# Patient Record
Sex: Female | Born: 1950 | Race: Black or African American | Hispanic: No | Marital: Single | Smoking: Never smoker
Health system: Southern US, Community
[De-identification: ages and names within clinical notes are randomized; demographics above are authoritative.]

## PROBLEM LIST (undated history)

## (undated) DIAGNOSIS — R001 Bradycardia, unspecified: Secondary | ICD-10-CM

## (undated) DIAGNOSIS — M1990 Unspecified osteoarthritis, unspecified site: Secondary | ICD-10-CM

## (undated) DIAGNOSIS — Z973 Presence of spectacles and contact lenses: Secondary | ICD-10-CM

## (undated) DIAGNOSIS — G8929 Other chronic pain: Secondary | ICD-10-CM

## (undated) DIAGNOSIS — R7303 Prediabetes: Secondary | ICD-10-CM

## (undated) DIAGNOSIS — I10 Essential (primary) hypertension: Secondary | ICD-10-CM

## (undated) DIAGNOSIS — M5137 Other intervertebral disc degeneration, lumbosacral region: Secondary | ICD-10-CM

## (undated) DIAGNOSIS — F329 Major depressive disorder, single episode, unspecified: Secondary | ICD-10-CM

## (undated) DIAGNOSIS — E7800 Pure hypercholesterolemia, unspecified: Secondary | ICD-10-CM

## (undated) DIAGNOSIS — N840 Polyp of corpus uteri: Secondary | ICD-10-CM

## (undated) DIAGNOSIS — M51379 Other intervertebral disc degeneration, lumbosacral region without mention of lumbar back pain or lower extremity pain: Secondary | ICD-10-CM

## (undated) DIAGNOSIS — R5382 Chronic fatigue, unspecified: Secondary | ICD-10-CM

## (undated) DIAGNOSIS — N950 Postmenopausal bleeding: Secondary | ICD-10-CM

## (undated) DIAGNOSIS — M549 Dorsalgia, unspecified: Secondary | ICD-10-CM

## (undated) DIAGNOSIS — R011 Cardiac murmur, unspecified: Secondary | ICD-10-CM

## (undated) HISTORY — DX: Essential (primary) hypertension: I10

## (undated) HISTORY — DX: Unspecified osteoarthritis, unspecified site: M19.90

## (undated) HISTORY — DX: Chronic fatigue, unspecified: R53.82

## (undated) HISTORY — DX: Cardiac murmur, unspecified: R01.1

## (undated) HISTORY — DX: Dorsalgia, unspecified: M54.9

## (undated) HISTORY — DX: Prediabetes: R73.03

## (undated) HISTORY — DX: Major depressive disorder, single episode, unspecified: F32.9

## (undated) HISTORY — DX: Bradycardia, unspecified: R00.1

## (undated) HISTORY — DX: Pure hypercholesterolemia, unspecified: E78.00

## (undated) HISTORY — PX: TUBAL LIGATION: SHX77

---

## 1997-09-08 ENCOUNTER — Encounter: Admission: RE | Admit: 1997-09-08 | Discharge: 1997-09-08 | Payer: Self-pay | Admitting: Family Medicine

## 1997-11-16 ENCOUNTER — Encounter: Admission: RE | Admit: 1997-11-16 | Discharge: 1997-11-16 | Payer: Self-pay | Admitting: Family Medicine

## 1997-11-28 ENCOUNTER — Encounter: Admission: RE | Admit: 1997-11-28 | Discharge: 1997-11-28 | Payer: Self-pay | Admitting: Family Medicine

## 1997-12-26 ENCOUNTER — Encounter: Admission: RE | Admit: 1997-12-26 | Discharge: 1997-12-26 | Payer: Self-pay | Admitting: Sports Medicine

## 1997-12-27 ENCOUNTER — Encounter: Admission: RE | Admit: 1997-12-27 | Discharge: 1997-12-27 | Payer: Self-pay | Admitting: Family Medicine

## 1998-01-09 ENCOUNTER — Encounter: Admission: RE | Admit: 1998-01-09 | Discharge: 1998-01-09 | Payer: Self-pay | Admitting: Family Medicine

## 1998-01-30 ENCOUNTER — Encounter: Admission: RE | Admit: 1998-01-30 | Discharge: 1998-01-30 | Payer: Self-pay | Admitting: Family Medicine

## 1999-01-23 ENCOUNTER — Encounter: Payer: Self-pay | Admitting: Obstetrics and Gynecology

## 1999-01-23 ENCOUNTER — Encounter: Admission: RE | Admit: 1999-01-23 | Discharge: 1999-01-23 | Payer: Self-pay | Admitting: Obstetrics and Gynecology

## 1999-03-21 ENCOUNTER — Emergency Department (HOSPITAL_COMMUNITY): Admission: EM | Admit: 1999-03-21 | Discharge: 1999-03-21 | Payer: Self-pay

## 2002-08-09 ENCOUNTER — Encounter: Admission: RE | Admit: 2002-08-09 | Discharge: 2002-08-09 | Payer: Self-pay | Admitting: Obstetrics and Gynecology

## 2002-08-09 ENCOUNTER — Encounter: Payer: Self-pay | Admitting: Obstetrics and Gynecology

## 2002-09-26 ENCOUNTER — Other Ambulatory Visit: Admission: RE | Admit: 2002-09-26 | Discharge: 2002-09-26 | Payer: Self-pay | Admitting: Obstetrics and Gynecology

## 2002-10-04 ENCOUNTER — Encounter: Payer: Self-pay | Admitting: Emergency Medicine

## 2002-10-04 ENCOUNTER — Emergency Department (HOSPITAL_COMMUNITY): Admission: EM | Admit: 2002-10-04 | Discharge: 2002-10-04 | Payer: Self-pay | Admitting: Emergency Medicine

## 2004-06-28 ENCOUNTER — Ambulatory Visit: Payer: Self-pay | Admitting: Family Medicine

## 2004-07-02 ENCOUNTER — Encounter (INDEPENDENT_AMBULATORY_CARE_PROVIDER_SITE_OTHER): Payer: Self-pay | Admitting: *Deleted

## 2004-08-05 ENCOUNTER — Ambulatory Visit: Payer: Self-pay | Admitting: Family Medicine

## 2004-08-16 ENCOUNTER — Encounter: Admission: RE | Admit: 2004-08-16 | Discharge: 2004-08-16 | Payer: Self-pay | Admitting: Sports Medicine

## 2005-04-17 ENCOUNTER — Emergency Department (HOSPITAL_COMMUNITY): Admission: EM | Admit: 2005-04-17 | Discharge: 2005-04-17 | Payer: Self-pay | Admitting: Emergency Medicine

## 2005-04-25 ENCOUNTER — Ambulatory Visit: Payer: Self-pay | Admitting: Sports Medicine

## 2005-04-25 ENCOUNTER — Ambulatory Visit (HOSPITAL_COMMUNITY): Admission: RE | Admit: 2005-04-25 | Discharge: 2005-04-25 | Payer: Self-pay | Admitting: Family Medicine

## 2005-05-16 ENCOUNTER — Ambulatory Visit: Payer: Self-pay | Admitting: Internal Medicine

## 2005-05-23 ENCOUNTER — Ambulatory Visit: Payer: Self-pay | Admitting: Internal Medicine

## 2005-05-29 ENCOUNTER — Ambulatory Visit: Payer: Self-pay | Admitting: Cardiology

## 2005-06-27 ENCOUNTER — Ambulatory Visit: Payer: Self-pay | Admitting: Endocrinology

## 2005-06-27 ENCOUNTER — Ambulatory Visit (HOSPITAL_COMMUNITY): Admission: RE | Admit: 2005-06-27 | Discharge: 2005-06-27 | Payer: Self-pay | Admitting: Internal Medicine

## 2005-07-14 ENCOUNTER — Ambulatory Visit: Payer: Self-pay | Admitting: Family Medicine

## 2005-08-04 ENCOUNTER — Encounter: Payer: Self-pay | Admitting: Cardiology

## 2005-08-04 ENCOUNTER — Ambulatory Visit: Payer: Self-pay

## 2005-08-18 ENCOUNTER — Encounter: Admission: RE | Admit: 2005-08-18 | Discharge: 2005-08-18 | Payer: Self-pay | Admitting: Family Medicine

## 2005-11-25 ENCOUNTER — Emergency Department (HOSPITAL_COMMUNITY): Admission: EM | Admit: 2005-11-25 | Discharge: 2005-11-25 | Payer: Self-pay | Admitting: Emergency Medicine

## 2005-12-15 ENCOUNTER — Ambulatory Visit (HOSPITAL_COMMUNITY): Admission: RE | Admit: 2005-12-15 | Discharge: 2005-12-15 | Payer: Self-pay | Admitting: Family Medicine

## 2005-12-15 ENCOUNTER — Ambulatory Visit: Payer: Self-pay | Admitting: Family Medicine

## 2006-01-06 ENCOUNTER — Ambulatory Visit: Payer: Self-pay | Admitting: Sports Medicine

## 2006-03-27 ENCOUNTER — Encounter (INDEPENDENT_AMBULATORY_CARE_PROVIDER_SITE_OTHER): Payer: Self-pay | Admitting: *Deleted

## 2006-06-15 ENCOUNTER — Emergency Department (HOSPITAL_COMMUNITY): Admission: EM | Admit: 2006-06-15 | Discharge: 2006-06-15 | Payer: Self-pay | Admitting: Family Medicine

## 2006-07-02 ENCOUNTER — Encounter (INDEPENDENT_AMBULATORY_CARE_PROVIDER_SITE_OTHER): Payer: Self-pay | Admitting: Family Medicine

## 2006-07-02 ENCOUNTER — Ambulatory Visit: Payer: Self-pay | Admitting: Family Medicine

## 2006-07-02 DIAGNOSIS — M549 Dorsalgia, unspecified: Secondary | ICD-10-CM

## 2006-07-02 DIAGNOSIS — G4700 Insomnia, unspecified: Secondary | ICD-10-CM

## 2006-07-09 ENCOUNTER — Encounter (INDEPENDENT_AMBULATORY_CARE_PROVIDER_SITE_OTHER): Payer: Self-pay | Admitting: Family Medicine

## 2006-07-29 ENCOUNTER — Telehealth: Payer: Self-pay | Admitting: *Deleted

## 2006-09-30 ENCOUNTER — Ambulatory Visit: Payer: Self-pay | Admitting: Family Medicine

## 2006-09-30 ENCOUNTER — Telehealth (INDEPENDENT_AMBULATORY_CARE_PROVIDER_SITE_OTHER): Payer: Self-pay | Admitting: *Deleted

## 2006-10-22 ENCOUNTER — Encounter: Payer: Self-pay | Admitting: *Deleted

## 2007-02-08 ENCOUNTER — Telehealth: Payer: Self-pay | Admitting: *Deleted

## 2007-05-24 ENCOUNTER — Emergency Department (HOSPITAL_COMMUNITY): Admission: EM | Admit: 2007-05-24 | Discharge: 2007-05-24 | Payer: Self-pay | Admitting: Emergency Medicine

## 2007-06-15 ENCOUNTER — Ambulatory Visit: Payer: Self-pay | Admitting: Family Medicine

## 2007-06-15 ENCOUNTER — Encounter (INDEPENDENT_AMBULATORY_CARE_PROVIDER_SITE_OTHER): Payer: Self-pay | Admitting: *Deleted

## 2007-06-15 ENCOUNTER — Encounter (INDEPENDENT_AMBULATORY_CARE_PROVIDER_SITE_OTHER): Payer: Self-pay | Admitting: Family Medicine

## 2007-06-15 DIAGNOSIS — R5382 Chronic fatigue, unspecified: Secondary | ICD-10-CM

## 2007-06-15 LAB — CONVERTED CEMR LAB
Alkaline Phosphatase: 96 units/L (ref 39–117)
BUN: 27 mg/dL — ABNORMAL HIGH (ref 6–23)
Basophils Absolute: 0.1 10*3/uL (ref 0.0–0.1)
Creatinine, Ser: 0.91 mg/dL (ref 0.40–1.20)
Glucose, Bld: 98 mg/dL (ref 70–99)
Lymphocytes Relative: 35 % (ref 12–46)
MCV: 85.4 fL (ref 78.0–100.0)
Monocytes Relative: 9 % (ref 3–12)
Neutro Abs: 3.8 10*3/uL (ref 1.7–7.7)
Neutrophils Relative %: 53 % (ref 43–77)
RBC: 4.46 M/uL (ref 3.87–5.11)
RDW: 15.1 % (ref 11.5–15.5)
Sodium: 145 meq/L (ref 135–145)
Total Bilirubin: 0.4 mg/dL (ref 0.3–1.2)
WBC: 7.1 10*3/uL (ref 4.0–10.5)

## 2007-06-17 ENCOUNTER — Encounter (INDEPENDENT_AMBULATORY_CARE_PROVIDER_SITE_OTHER): Payer: Self-pay | Admitting: Family Medicine

## 2007-07-23 ENCOUNTER — Encounter (INDEPENDENT_AMBULATORY_CARE_PROVIDER_SITE_OTHER): Payer: Self-pay | Admitting: Family Medicine

## 2007-07-23 ENCOUNTER — Ambulatory Visit: Payer: Self-pay | Admitting: Family Medicine

## 2007-07-23 ENCOUNTER — Encounter: Payer: Self-pay | Admitting: *Deleted

## 2007-07-28 ENCOUNTER — Encounter (INDEPENDENT_AMBULATORY_CARE_PROVIDER_SITE_OTHER): Payer: Self-pay | Admitting: Family Medicine

## 2007-08-12 ENCOUNTER — Telehealth: Payer: Self-pay | Admitting: *Deleted

## 2007-08-24 ENCOUNTER — Ambulatory Visit (HOSPITAL_COMMUNITY): Admission: RE | Admit: 2007-08-24 | Discharge: 2007-08-24 | Payer: Self-pay | Admitting: Family Medicine

## 2007-09-01 ENCOUNTER — Ambulatory Visit (HOSPITAL_BASED_OUTPATIENT_CLINIC_OR_DEPARTMENT_OTHER): Admission: RE | Admit: 2007-09-01 | Discharge: 2007-09-01 | Payer: Self-pay | Admitting: Family Medicine

## 2007-09-11 ENCOUNTER — Ambulatory Visit: Payer: Self-pay | Admitting: Internal Medicine

## 2007-09-24 ENCOUNTER — Telehealth: Payer: Self-pay | Admitting: *Deleted

## 2007-09-27 ENCOUNTER — Ambulatory Visit: Payer: Self-pay | Admitting: Family Medicine

## 2007-09-27 DIAGNOSIS — M21619 Bunion of unspecified foot: Secondary | ICD-10-CM

## 2007-10-05 ENCOUNTER — Encounter (INDEPENDENT_AMBULATORY_CARE_PROVIDER_SITE_OTHER): Payer: Self-pay | Admitting: Family Medicine

## 2007-12-13 ENCOUNTER — Telehealth: Payer: Self-pay | Admitting: *Deleted

## 2007-12-15 ENCOUNTER — Telehealth: Payer: Self-pay | Admitting: *Deleted

## 2007-12-22 ENCOUNTER — Ambulatory Visit: Payer: Self-pay | Admitting: Family Medicine

## 2008-02-15 ENCOUNTER — Ambulatory Visit: Payer: Self-pay | Admitting: Family Medicine

## 2008-02-15 ENCOUNTER — Ambulatory Visit (HOSPITAL_COMMUNITY): Admission: RE | Admit: 2008-02-15 | Discharge: 2008-02-15 | Payer: Self-pay | Admitting: Family Medicine

## 2008-02-15 ENCOUNTER — Telehealth: Payer: Self-pay | Admitting: Family Medicine

## 2008-02-15 DIAGNOSIS — S92919A Unspecified fracture of unspecified toe(s), initial encounter for closed fracture: Secondary | ICD-10-CM | POA: Insufficient documentation

## 2008-07-24 ENCOUNTER — Encounter: Payer: Self-pay | Admitting: *Deleted

## 2008-08-28 ENCOUNTER — Ambulatory Visit (HOSPITAL_COMMUNITY): Admission: RE | Admit: 2008-08-28 | Discharge: 2008-08-28 | Payer: Self-pay | Admitting: Family Medicine

## 2008-08-31 ENCOUNTER — Telehealth: Payer: Self-pay | Admitting: *Deleted

## 2009-02-19 ENCOUNTER — Encounter: Payer: Self-pay | Admitting: Family Medicine

## 2009-02-19 ENCOUNTER — Emergency Department (HOSPITAL_COMMUNITY): Admission: EM | Admit: 2009-02-19 | Discharge: 2009-02-19 | Payer: Self-pay | Admitting: Family Medicine

## 2009-04-26 ENCOUNTER — Encounter: Payer: Self-pay | Admitting: *Deleted

## 2009-04-26 ENCOUNTER — Ambulatory Visit: Payer: Self-pay | Admitting: Family Medicine

## 2009-04-26 ENCOUNTER — Encounter: Payer: Self-pay | Admitting: Family Medicine

## 2009-09-03 ENCOUNTER — Ambulatory Visit: Payer: Self-pay | Admitting: Family Medicine

## 2009-09-03 ENCOUNTER — Encounter: Payer: Self-pay | Admitting: *Deleted

## 2009-09-03 ENCOUNTER — Encounter: Payer: Self-pay | Admitting: Family Medicine

## 2009-09-03 LAB — CONVERTED CEMR LAB
ALT: 10 units/L (ref 0–35)
AST: 17 units/L (ref 0–37)
Albumin: 4.4 g/dL (ref 3.5–5.2)
BUN: 17 mg/dL (ref 6–23)
Chloride: 108 meq/L (ref 96–112)
Creatinine, Ser: 0.88 mg/dL (ref 0.40–1.20)
HCT: 36.9 % (ref 36.0–46.0)
Hemoglobin: 11.4 g/dL — ABNORMAL LOW (ref 12.0–15.0)
MCV: 86.4 fL (ref 78.0–100.0)
RBC: 4.27 M/uL (ref 3.87–5.11)
Total Bilirubin: 0.3 mg/dL (ref 0.3–1.2)
WBC: 7.1 10*3/uL (ref 4.0–10.5)

## 2009-09-18 ENCOUNTER — Encounter: Payer: Self-pay | Admitting: Family Medicine

## 2009-09-24 ENCOUNTER — Encounter: Payer: Self-pay | Admitting: Family Medicine

## 2009-09-24 ENCOUNTER — Ambulatory Visit: Payer: Self-pay | Admitting: Family Medicine

## 2009-09-24 DIAGNOSIS — F331 Major depressive disorder, recurrent, moderate: Secondary | ICD-10-CM | POA: Insufficient documentation

## 2009-09-25 ENCOUNTER — Encounter: Payer: Self-pay | Admitting: Family Medicine

## 2009-09-29 ENCOUNTER — Emergency Department (HOSPITAL_COMMUNITY): Admission: EM | Admit: 2009-09-29 | Discharge: 2009-09-29 | Payer: Self-pay | Admitting: Family Medicine

## 2009-10-08 ENCOUNTER — Ambulatory Visit (HOSPITAL_COMMUNITY): Admission: RE | Admit: 2009-10-08 | Discharge: 2009-10-08 | Payer: Self-pay | Admitting: *Deleted

## 2009-10-11 ENCOUNTER — Encounter: Payer: Self-pay | Admitting: Family Medicine

## 2009-10-12 ENCOUNTER — Ambulatory Visit: Payer: Self-pay | Admitting: Family Medicine

## 2009-10-24 ENCOUNTER — Encounter: Payer: Self-pay | Admitting: Family Medicine

## 2009-10-24 ENCOUNTER — Ambulatory Visit (HOSPITAL_COMMUNITY): Admission: RE | Admit: 2009-10-24 | Discharge: 2009-10-24 | Payer: Self-pay | Admitting: Family Medicine

## 2009-10-24 ENCOUNTER — Encounter: Payer: Self-pay | Admitting: *Deleted

## 2009-10-24 ENCOUNTER — Ambulatory Visit: Payer: Self-pay | Admitting: Family Medicine

## 2009-10-24 DIAGNOSIS — I498 Other specified cardiac arrhythmias: Secondary | ICD-10-CM

## 2009-10-29 ENCOUNTER — Encounter: Payer: Self-pay | Admitting: Family Medicine

## 2009-10-29 ENCOUNTER — Ambulatory Visit: Payer: Self-pay | Admitting: Family Medicine

## 2009-10-29 ENCOUNTER — Encounter: Payer: Self-pay | Admitting: Internal Medicine

## 2009-10-29 ENCOUNTER — Telehealth: Payer: Self-pay | Admitting: *Deleted

## 2009-11-02 ENCOUNTER — Ambulatory Visit: Payer: Self-pay | Admitting: Internal Medicine

## 2009-11-06 ENCOUNTER — Ambulatory Visit: Payer: Self-pay | Admitting: Family Medicine

## 2009-11-06 ENCOUNTER — Telehealth (INDEPENDENT_AMBULATORY_CARE_PROVIDER_SITE_OTHER): Payer: Self-pay | Admitting: *Deleted

## 2009-11-06 DIAGNOSIS — R3589 Other polyuria: Secondary | ICD-10-CM | POA: Insufficient documentation

## 2009-11-06 DIAGNOSIS — R358 Other polyuria: Secondary | ICD-10-CM

## 2009-11-06 DIAGNOSIS — R631 Polydipsia: Secondary | ICD-10-CM

## 2009-11-06 LAB — CONVERTED CEMR LAB: Hgb A1c MFr Bld: 5.9 %

## 2009-11-08 ENCOUNTER — Ambulatory Visit: Payer: Self-pay | Admitting: Internal Medicine

## 2009-12-06 ENCOUNTER — Ambulatory Visit: Payer: Self-pay | Admitting: Internal Medicine

## 2009-12-06 ENCOUNTER — Encounter: Payer: Self-pay | Admitting: *Deleted

## 2009-12-17 ENCOUNTER — Ambulatory Visit: Payer: Self-pay | Admitting: Family Medicine

## 2009-12-21 ENCOUNTER — Encounter: Payer: Self-pay | Admitting: Family Medicine

## 2009-12-24 ENCOUNTER — Encounter: Payer: Self-pay | Admitting: Family Medicine

## 2009-12-26 ENCOUNTER — Encounter (INDEPENDENT_AMBULATORY_CARE_PROVIDER_SITE_OTHER): Payer: Self-pay | Admitting: *Deleted

## 2009-12-29 ENCOUNTER — Emergency Department (HOSPITAL_COMMUNITY)
Admission: EM | Admit: 2009-12-29 | Discharge: 2009-12-29 | Disposition: A | Discharge status: Other Acute Inpatient Hospital | Payer: Self-pay | Source: Home / Self Care | Admitting: Emergency Medicine

## 2009-12-29 ENCOUNTER — Inpatient Hospital Stay (HOSPITAL_COMMUNITY): Admission: EM | Admit: 2009-12-29 | Discharge: 2009-12-30 | Payer: Self-pay | Admitting: Emergency Medicine

## 2010-01-01 ENCOUNTER — Encounter: Payer: Self-pay | Admitting: Family Medicine

## 2010-01-01 ENCOUNTER — Telehealth: Payer: Self-pay | Admitting: Family Medicine

## 2010-01-03 ENCOUNTER — Encounter: Payer: Self-pay | Admitting: Family Medicine

## 2010-01-04 ENCOUNTER — Encounter: Payer: Self-pay | Admitting: Family Medicine

## 2010-01-09 ENCOUNTER — Telehealth: Payer: Self-pay | Admitting: Family Medicine

## 2010-01-14 ENCOUNTER — Ambulatory Visit: Payer: Self-pay | Admitting: Family Medicine

## 2010-01-15 ENCOUNTER — Encounter: Payer: Self-pay | Admitting: Family Medicine

## 2010-01-15 ENCOUNTER — Ambulatory Visit: Payer: Self-pay

## 2010-01-15 ENCOUNTER — Ambulatory Visit (HOSPITAL_COMMUNITY): Admission: RE | Admit: 2010-01-15 | Payer: Self-pay | Source: Home / Self Care | Admitting: Internal Medicine

## 2010-01-18 ENCOUNTER — Ambulatory Visit: Payer: Self-pay | Admitting: Internal Medicine

## 2010-02-02 ENCOUNTER — Emergency Department (HOSPITAL_COMMUNITY)
Admission: EM | Admit: 2010-02-02 | Discharge: 2010-02-02 | Payer: Self-pay | Source: Home / Self Care | Admitting: Family Medicine

## 2010-02-12 ENCOUNTER — Ambulatory Visit: Admission: RE | Admit: 2010-02-12 | Discharge: 2010-02-12 | Payer: Self-pay | Source: Home / Self Care

## 2010-02-12 DIAGNOSIS — W57XXXA Bitten or stung by nonvenomous insect and other nonvenomous arthropods, initial encounter: Secondary | ICD-10-CM

## 2010-02-12 DIAGNOSIS — T148 Other injury of unspecified body region: Secondary | ICD-10-CM | POA: Insufficient documentation

## 2010-02-18 ENCOUNTER — Encounter: Payer: Self-pay | Admitting: Family Medicine

## 2010-02-20 ENCOUNTER — Encounter: Payer: Self-pay | Admitting: Family Medicine

## 2010-02-28 NOTE — Assessment & Plan Note (Signed)
Summary: Bradycardia (Fatigue and depression)   Vital Signs:  Patient profile:   60 year old female Weight:      157.2 pounds Temp:     98.4 degrees F oral Pulse rate:   55 / minute Pulse rhythm:   regular BP sitting:   135 / 75  (left arm) Cuff size:   regular  Vitals Entered By: Loralee Pacas CMA (October 24, 2009 9:55 AM) CC: fatigue and depression   Primary Care Owenn Rothermel:  Edd Arbour  CC:  fatigue and depression.  History of Present Illness: [employer] work-in Visit: Pt comes in today as an emergency work in visit. She says she is just so fatigued she doesn't know if she can go on through her day. She has been chronically tire and fatigued since 1997 when she first got laid off from a job. She now is working only 8 hours a day and is still very fatigued. 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 She often goes back home to sleep the rest of the day. She then doesn't sleep well at night.  She was seen by Dr. Rivka Safer and diagnosed with depression. She has been on Celexa about 3-4 weeks now and at first felt a little better but now says she doesn't feel good today. She "feels horrible" and has a hard time waking up in the morning. No fevers, no chills, no N/V/D. No signs of illness. She does know that she has a slow heart beat because she was told this at the UC recently.   Habits & Providers  Alcohol-Tobacco-Diet     Alcohol drinks/day: 0     Tobacco Status: never  Current Medications (verified): 1)  Ibuprofen 800 Mg Tabs (Ibuprofen) .... One Tab By Mouth Q8 Hours As Needed For Back Pain 2)  Citalopram Hydrobromide 40 Mg Tabs (Citalopram Hydrobromide) .... Take 1 Pill Daily For Fatigue and Mood  Allergies (verified): No Known Drug Allergies  Review of Systems        vitals reviewed and pertinent negatives and positives seen in HPI   Physical Exam  General:   Well-developed,well-nourished,in no acute distress; alert, pt asked multiple questions over and over during the exam and did not seem to comprehend the answers.  Lungs:  Normal respiratory effort, chest expands symmetrically. Lungs are clear to auscultation, no crackles or wheezes. Heart:  Bradycardicl rate but regular sinus rhythm. S1 and S2 normal without gallop, murmur, click, rub or other extra sounds. Psych:  slowed mentally, good eye contact, memory somewhat lacking. repeated herself mulitple times.    Impression & Recommendations:  Problem # 1:  BRADYCARDIA (ICD-427.89) Assessment New Pt is here today feeling fatigued. After all the questioning and exam all I could come up with is that she does not like to wake up early in the morning (I suggested finding a job that is part time in the afternoon) and Bradycardia on EKG. Her  rate was 48 bpm. This may be the reason she does not feel well. Discussed with the patient that she needs to see a cardiologist. Will refer.   Orders: 12 Lead EKG (12 Lead EKG) Cardiology Referral (Cardiology) [facility]- Est Level  3 ([address])  Complete Medication List: 1)  Ibuprofen 800 Mg Tabs (Ibuprofen) .... One tab by mouth q8 hours as needed for back pain 2)  Citalopram Hydrobromide 40 Mg Tabs (Citalopram hydrobromide) .... Take 1 pill daily for fatigue  and mood  Patient Instructions: 1)  You have chronic fatigue syndrome.  2)  You need to do all you can to optimize your lifestyle: that means eating lots of fruits and vegetables, getting some sort of exercise for at least 15 min a day (walking is a good start), looking for a job that is in the afternoon since you don't feel sharp in the morning, and forming goo relationships with your family and/or friends.  3)  I have increased your Celexa to 40 mg a day. For now take 2 pills a day, but when you get your next refill it should be 40 mg so you can just take 1 pill a day. Come back in 2-3 weeks to see Dr. Rivka Safer if you  are not feeling a little better.  4)  We are going to send you to a cardiologist to be seen for the slow heart beat. Your heart beat is 48 and is suppose to be 60-100 Prescriptions: CITALOPRAM HYDROBROMIDE 40 MG TABS (CITALOPRAM HYDROBROMIDE) take 1 pill daily for fatigue and mood  #31 x 5   Entered and Authorized by:   Jamie Brookes MD   Signed by:   Jamie Brookes MD on 10/24/2009   Method used:   Faxed to ...       [facility] Department (retail)       [address] [place], [place]  [address]       Ph: [phone]       Fax: [phone]   RxID:   [id number]

## 2010-02-28 NOTE — Miscellaneous (Signed)
Summary: walk in  Clinical Lists Changes c/o pain above r breast since yesterday. laying down makes it better. denies lifting anything heavy or unusual physical activity.she took an asa which did not help. no meds since. pain 6/10. denies hx of cardiac problems.   denies SOB, diaphoresis, neck,arm,jaw pain or nausea.   states she is very stressed about her job. she has applied for & been denied twice since she is working part time. feels she needs to be retired. we have no appt here left. sent to UC. she agreed with plan.Golden Circle RN  February 19, 2009 3:20 PM

## 2010-02-28 NOTE — Miscellaneous (Signed)
Summary: Appointment No Show  Appointment status changed to no show by [employer] on 12/21/2009 11:48 AM.  No Show Comments ---------------- echo dx 427.89/sl  Appointment Information ----------------------- Appt Type:  CARDIOLOGY ANCILLARY VISIT      Date:  Friday, December 21, 2009      Time:  10:30 AM for 60 min   Urgency:  Routine   Made By:  Pearson Grippe  To Visit:  [id number]    Reason:  echo dx 427.89/sl  Appt Comments ------------- -- 12/21/09 11:48: ([facility]) NO SHOW -- echo dx 427.89/sl -- 12/18/09 6:55: ([facility]) BOOKED -- Routine CARDIOLOGY ANCILLARY VISIT at 12/21/2009 10:30 AM for 60 min echo dx 427.89/sl -- 12/07/09 9:47: ([facility]) BOOKED -- Routine CARDIOLOGY ANCILLARY VISIT at

## 2010-02-28 NOTE — Miscellaneous (Signed)
Summary: medication addition  Clinical Lists Changes  Medications: Added new medication of CONCERTA 18 MG CR-TABS (METHYLPHENIDATE HCL) take one tab daily by mouth in the morning - Signed Rx of CONCERTA 18 MG CR-TABS (METHYLPHENIDATE HCL) take one tab daily by mouth in the morning;  #30 x 0;  Signed;  Entered by: Edd Arbour;  Authorized by: Edd Arbour;  Method used: Handwritten    Prescriptions: CONCERTA 18 MG CR-TABS (METHYLPHENIDATE HCL) take one tab daily by mouth in the morning  #30 x 0   Entered and Authorized by:   Edd Arbour   Signed by:   Edd Arbour on 01/04/2010   Method used:   Handwritten   RxID:   [id number]

## 2010-02-28 NOTE — Progress Notes (Signed)
Summary: re: referral  ---- Converted from flag ---- ---- 10/29/2009 12:10 PM, Milinda Antis MD wrote: Pt has a cardiology referral pending any information on this ------------------------------  Set up appt for 10/7 @ 10:20am pt to arrive at 10am. Appt. is with Dr Johney Frame. [facility]. faxed orders

## 2010-02-28 NOTE — Progress Notes (Signed)
----   Converted from flag ---- ---- 01/01/2010 3:08 PM, Edd Arbour wrote: I will switch her to concerta, but she needs to come in to get the perscription because it is controlled. or can I call it in? ------------------------------ spoke with Dr. Rivka Safer and he will write RX for patient and she can pick up Friday afternoon.  he advises to schedule appointment for patient to see him in 2 weeks. message left on voicemail of patient's phone to call back. Theresia Lo RN  January 01, 2010 5:23 PM  pt returned call - pls call  spoke with patient and gave her message from MD. she will plan to pick up RX  Friday around 2:00 PM. she reports she has cut celexa back to 1 tab daily because she is feeling so well with the Adderal. will notify MD. Theresia Lo RN  January 02, 2010 9:15 AM

## 2010-02-28 NOTE — Miscellaneous (Signed)
Summary: Medical record request  Clinical Lists Changes  Rec'd medical record request to be faxed to Disability determination services date sent: 10/12/2009 Marily Memos  December 26, 2009 9:16 AM

## 2010-02-28 NOTE — Miscellaneous (Signed)
Summary: question about celexa  Clinical Lists Changes  patient coms to office earlier today wanting to ask question about Celexa.  she has been on celexa since August. states the reason she started it was because she was very tired and sleepy all the time.  she has not noticed much improvement with the 40 mg celexa. she notes she  continues to be sleepy and tired. had appointment with cardiologist this AM and had a stress test she states.  patient then admits that she took and extra celexa this AM and she wonders is her dose can be increased to take two daily. Dr. Rivka Safer consulted and he advises for patient to continue celexa 40 mg daily, not to increase. advised patient that he may make a change in her meds . he has spoken with cardiologist today . will schedule appointment for  patient with Dr. Rivka Safer to discuss cardiology visit and medication  . appointment scheduled for 12/17/2009 Theresia Lo RN  December 06, 2009 2:35 PM

## 2010-02-28 NOTE — Letter (Signed)
Summary: Work Excuse  [facility]  [address]   [place], [place] [address]   Phone: [phone]  Fax: [phone]    Today's Date: April 26, 2009  Name of Patient: Kimberly Guerrero  The above named patient had a medical visit today at:  9 am  Please take this into consideration when reviewing the time away from work/school.    Special Instructions:  [  ] None  [ x ] To be off the remainder of today, returning to the normal work schedule tomorrow.  [  ] To be off until the next scheduled appointment on ______________________.  [  ] Other ________________________________________________________________ ________________________________________________________________________   Sincerely yours,   Lequita Asal  MD

## 2010-02-28 NOTE — Assessment & Plan Note (Signed)
Summary: cpp/Kimberly Guerrero pt eo/resch'd from Dr. Leonie Green sched)bmc   Vital Signs:  Patient profile:   60 year old female Height:      68 inches Weight:      161.3 pounds Temp:     98.7 degrees F oral Pulse rate:   54 / minute Pulse rhythm:   regular BP sitting:   115 / 74  (left arm) Cuff size:   regular  Vitals Entered By: Loralee Pacas CMA (September 24, 2009 2:29 PM)  Primary Provider:  Edd Arbour  CC:  fatigue and screening for cervical cancer.  History of Present Illness: patient is a 60 y/o aaf with a history of chronic fatigue. she comes in today for a physical exam, pap smear and to discuss her issues with fatigue and weakness. reviewing her file for the first time it appears her sleep study was nonspecific, but did show night time awakwening. her TSH have been low normal. her CBC has been normal. She had a Lyme titer that was negative as well. She says she wakes throughout the night for no reason. Goes to bed early and has trouble staying awake thoughout the day. She has no thyroidal symptoms.   Depression History:      The patient presents with symptoms of depression which have been present for greater than two weeks.  The patient denies a depressed mood most of the day but notes a diminished interest in her usual daily activities.  The patient denies insomnia, psychomotor retardation, and fatigue (loss of energy).        The patient denies that she feels like life is not worth living, denies that she wishes that she were dead, and denies that she has thought about ending her life.         Problems Prior to Update: 1)  Fracture, Toe, Left  (ICD-826.0) 2)  Bunions, Bilateral  (ICD-727.1) 3)  Screening For Malignant Neoplasm of The Cervix  (ICD-V76.2) 4)  Chronic Fatigue Syndrome  (ICD-780.71) 5)  Insomnia  (ICD-780.52) 6)  Back Pain  (ICD-724.5)  Medications Prior to Update: 1)  Ibuprofen 800 Mg Tabs (Ibuprofen) .... One Tab By Mouth Q8 Hours As Needed For Back Pain 2)   Amitriptyline Hcl 50 Mg Tabs (Amitriptyline Hcl) .... Take One Before Bed  Current Medications (verified): 1)  Ibuprofen 800 Mg Tabs (Ibuprofen) .... One Tab By Mouth Q8 Hours As Needed For Back Pain 2)  Amitriptyline Hcl 50 Mg Tabs (Amitriptyline Hcl) .... 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  No tob, etoh or drugs Lives with friend.    Risk Factors: Alcohol Use: 0 (09/03/2009)  Risk Factors: Smoking Status: never (09/03/2009)  Family History: Reviewed history from 03/26/2006 and no changes required. No MI, DM, or stroke  Social History: Reviewed history from 06/15/2007 and no changes required. 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  No tob, etoh or drugs Lives with friend.    Physical Exam  General:  alert and well-developed.   Head:  normocephalic, atraumatic, no abnormalities observed, and no abnormalities palpated.   Eyes:  vision grossly  intact, pupils equal, pupils round, and pupils reactive to light.   Ears:  R ear normal, L ear normal, and no external deformities.   Nose:  no external deformity.   Mouth:  good dentition.   Neck:  supple.   Chest Wall:  no deformities.   Breasts:  skin/areolae normal, no masses, no abnormal thickening, no nipple discharge, no tenderness, and no adenopathy.   Lungs:  normal respiratory effort, normal breath sounds, no crackles, and no wheezes.   Heart:  normal rate and regular rhythm.   Abdomen:  soft and non-tender.   Genitalia:  superior labia majorum skin tagnormal introitus, no vaginal discharge, mucosa pink and  moist, and no adnexal masses or tenderness.   Neurologic:  alert & oriented X3 and cranial nerves II-XII intact.   Skin:  Intact without suspicious lesions or rashes Cervical Nodes:  No lymphadenopathy noted Axillary Nodes:  No palpable lymphadenopathy Psych:  slightly anxious.     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 scored a 16 on the PHQ-9. Her fatigue is likely related to underlying Depressive disorder. Will prescribe Celexa 20 mg once daily for 2 weeks and have her come back and see me then.  Problem # 3:  BACK PAIN (ICD-724.5)  Her updated medication list for this problem includes:    Ibuprofen 800 Mg Tabs (Ibuprofen) ..... One tab by mouth q8 hours as needed for back pain  Problem # 4:  Gynecological examination-routine (ICD-V72.31) normal female physical examination.  Complete Medication List: 1)  Ibuprofen 800 Mg Tabs (Ibuprofen) .... One tab by mouth q8 hours as needed for back pain 2)  Celexa 20 Mg Tabs (Citalopram hydrobromide) .... One tab daily in the morning  Patient Instructions: 1)  It was great meeting you. I hope you feel better soon. 2)  Please follow up with me in two weeks.  Prescriptions: CELEXA 20 MG TABS (CITALOPRAM HYDROBROMIDE) one tab daily in the morning  #30 x 0   Entered and Authorized by:   Edd Arbour   Signed by:   Edd Arbour on 09/24/2009   Method used:   Printed then faxed to ...       [facility] Department (retail)       [address] [place], [place]  [address]       Ph: [phone]       Fax: [phone]   RxID:   [id number]

## 2010-02-28 NOTE — Letter (Signed)
Summary: Out of Work  [facility]  [address]   [place], [place] [address]   Phone: [phone]  Fax: [phone]    October 24, 2009   Employee:  SHAKIMA NISLEY    To Whom It May Concern:   For Medical reasons, please excuse the above named employee from work for the following dates:  Start:   10-24-09  End:   10-26-09  If you need additional information, please feel free to contact our office.         Sincerely,    Jamie Brookes MD

## 2010-02-28 NOTE — Assessment & Plan Note (Signed)
Summary: F/U [facility]   Vital Signs:  Patient profile:   60 year old female Height:      68 inches Weight:      152 pounds BMI:     23.20 Temp:     98.3 degrees F Pulse rate:   60 / minute BP sitting:   104 / 76  (right arm) Cuff size:   regular  Vitals Entered By: Dennison Nancy RN (February 12, 2010 2:00 PM) CC: Rash to arms Is Patient Diabetic? No Pain Assessment Patient in pain? no        Referring Provider:  Jamie Brookes Primary Provider:  Edd Arbour  CC:  Rash to arms.  History of Present Illness: 1. Rash appears to be bed bugs vs. fleas. she has discrete 1x1 cm erythematous lesions that are pruritic with evidence of scratches. she has no other symptoms and does no remember being bitten. she has an old second had bed. The lesions have been present for the last 2 weeks. no fevers, no arthritis, no urinary symptoms, no mental status changes.  Allergies: No Known Drug Allergies  Review of Systems       see HPI  Physical Exam  Neck:  4 erythematous lesions on neck, scratches. Extremities:  similar erythematous lesions on arms.   Impression & Recommendations:  Problem # 1:  INSECT BITE (ICD-919.4)  triamcinolone 0.1 % cream   Flea BOMB apartment per instructions  benadryl OTC  Orders: [facility]- Est Level  3 ([address])  Complete Medication List: 1)  Ibuprofen 800 Mg Tabs (Ibuprofen) .... One tab by mouth q8 hours as needed for back pain 2)  Citalopram Hydrobromide 40 Mg Tabs (Citalopram hydrobromide) .... Take two pills daily for depression, fatigue 3)  Triamcinolone Acetonide 0.1 % Crea (Triamcinolone acetonide) .... Apply to affected areas twice daily Prescriptions: TRIAMCINOLONE ACETONIDE 0.1 % CREA (TRIAMCINOLONE ACETONIDE) apply to affected areas twice daily  #1 x 0   Entered and Authorized by:   Edd Arbour   Signed by:   Edd Arbour on 02/12/2010   Method used:   Faxed to ...       [facility] Department (retail)       [address] [place], [place]  [address]       Ph: [phone]       Fax: [phone]   RxID:   [id number]    Orders Added: 1)  [facility]- Est Level  3 [[address]]

## 2010-02-28 NOTE — Assessment & Plan Note (Signed)
Summary: F/U  [facility]   Vital Signs:  Patient profile:   60 year old female Weight:      157.3 pounds Temp:     98.9 degrees F oral Pulse rate:   120 / minute Pulse rhythm:   regular BP sitting:   128 / 71  (left arm) Cuff size:   regular  Vitals Entered By: Loralee Pacas CMA (November 06, 2009 1:47 PM) CC: follow-up visit Comments last friday she went to [facility] per dr. Jeanice Lim. they are going to call her to come back in so that they can do a holter monitor. she is feeling a little better but not 100%.    Visit Type:  office visit Referring Seena Face:  Jamie Brookes Primary Gianella Chismar:  Edd Arbour  CC:  follow-up visit.  History of Present Illness: Patient is a well known patient to me. 60 y/o female with depression, chronic fatigue, and bradycardia. She was seen by a cardiologist at [facility] and had a plan for a holter monitor to observe her sinus bradycardia for any underlying arrythmias. She will be following up with them to complete that test. Today she says the Celexa we started a month ago is definetely improving her symptoms of depression. she still complains of fatigue during the day at times and periodic insomnia. We talked for 15 minutes about good sleep hygiene.  Her last complaint was of polydipsia and polyuria that has been going on for a while, she just had not mentioned it on our last visit. She also has a BMP reading in the past with noted hyperglycemia. A HA1c was ordered, which was 5.9 - wnl.  Habits & Providers  Alcohol-Tobacco-Diet     Alcohol drinks/day: 0     Tobacco Status: never  Problems Prior to Update: 1)  Polydipsia  (ICD-783.5) 2)  Polyuria  (ICD-788.42) 3)  Viral Uri  (ICD-465.9) 4)  Bradycardia  (ICD-427.89) 5)  Depression, Moderate, Recurrent  (ICD-296.32) 6)  Fracture, Toe, Left  (ICD-826.0) 7)  Bunions, Bilateral  (ICD-727.1) 8)  Screening For Malignant Neoplasm of The Cervix  (ICD-V76.2) 9)  Chronic Fatigue Syndrome   (ICD-780.71) 10)  Insomnia  (ICD-780.52) 11)  Back Pain  (ICD-724.5)  Medications Prior to Update: 1)  Ibuprofen 800 Mg Tabs (Ibuprofen) .... One Tab By Mouth Q8 Hours As Needed For Back Pain 2)  Citalopram Hydrobromide 40 Mg Tabs (Citalopram Hydrobromide) .... Take 1 Pill Daily For Fatigue and Mood  Current Medications (verified): 1)  Ibuprofen 800 Mg Tabs (Ibuprofen) .... One Tab By Mouth Q8 Hours As Needed For Back Pain 2)  Citalopram Hydrobromide 40 Mg Tabs (Citalopram Hydrobromide) .... Take 1 Pill Daily For Fatigue and Mood  Allergies (verified): No Known Drug Allergies  Family History: Reviewed history from 11/02/2009 and no changes required. No MI, DM, or stroke  denies FH of bradycardia or arrhythmias, though her father had pacemaker implanted in his 53s.  Social History: Reviewed history from 11/02/2009 and no changes required. 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  No tob, etoh or drugs Lives with friend in [place].  Review of Systems       Insomnia, polyuria, polydipsia, fatigue  Physical Exam  General:  alert, well-developed, and well-nourished.   Head:  normocephalic and atraumatic.   Eyes:  vision grossly intact.   Ears:  R ear normal and L ear normal.   Nose:  no external deformity.   Mouth:  good  dentition.   Neck:  supple.     Impression & Recommendations:  Problem # 1:  BRADYCARDIA (ICD-427.89) Assessment Unchanged Seen by cardiology, will follow with them for halter monitor. she was sinus brady today and asymptomatic.  Orders: [facility]- Est Level  3 ([address])  Problem # 2:  POLYURIA (UEA-540.98) Assessment: Unchanged had elevated glucose on BMP 112, and polyuria and polydipsia Orders: A1C-[facility] (11914) 5.9 (normal) [facility]- Est Level  3 ([address])  Problem # 3:  INSOMNIA (ICD-780.52) Assessment: Unchanged advised to take Lunesta or other sleep aid. she wanted to wait and see.  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 she has noted improvement with these meds.  Orders: [facility]- Est Level  3 ([address])  Complete Medication List: 1)  Ibuprofen 800 Mg Tabs (Ibuprofen) .... One tab by mouth q8 hours as needed for back pain 2)  Citalopram Hydrobromide 40 Mg Tabs (Citalopram hydrobromide) .... Take 1 pill daily for fatigue and mood  Patient Instructions: 1)  It was great seeing you again. 2)  I'm glad to see the antidepressant medication is working. 3)  Please make sure to follow up with the [facility] cardiologist in order to get the holter monitor. 4)  Your Primary Doc, 5)  Edd Arbour MD 6)  Please schedule a follow-up appointment in 6 months. Prescriptions: CITALOPRAM HYDROBROMIDE 40 MG TABS (CITALOPRAM HYDROBROMIDE) take 1 pill daily for fatigue and mood  #31 x 3   Entered and Authorized by:   Edd Arbour   Signed by:   Edd Arbour on 11/06/2009   Method used:   Faxed to ...       [facility] [address] [phone]* (retail)       [address]       [place], [place]  [address]       Ph: [phone]       Fax: [phone]   RxID:   [id number] CITALOPRAM HYDROBROMIDE 40 MG TABS (CITALOPRAM HYDROBROMIDE) take 1 pill daily for fatigue and mood  #31 x 3   Entered and Authorized by:   Edd Arbour   Signed by:   Edd Arbour on 11/06/2009   Method used:   Faxed to ...       [facility] Department (retail)       [address] [place], [place]  [address]       Ph: [phone]       Fax: [phone]   RxID:   [id number]   Laboratory Results   Blood Tests   Date/Time Received: November 06, 2009 2:31 PM  Date/Time Reported: November 06, 2009 2:47 PM   HGBA1C: 5.9%   (Normal Range: Non-Diabetic - 3-6%   Control Diabetic - 6-8%)  Comments: ...............test performed by......Marland KitchenBonnie A. [place], MLS (ASCP)cm

## 2010-02-28 NOTE — Miscellaneous (Signed)
Summary: ROI: DDS  ROI: DDS   Imported By: Knox Royalty 02/21/2010 11:46:00  _____________________________________________________________________  External Attachment:    Type:   Image     Comment:   External Document

## 2010-02-28 NOTE — Miscellaneous (Signed)
Summary: Walk in  Pt presented to [facility] this am stating that she just did not feel well and didn't know how much longer she could take it.  Says she feels tired all the time and has had this problem since 1997.  Has a dx of chronic fatigue and depression.  No specific physical c/o other than feeling tired.  Attempted to work this am but had to leave.  Appt given to be seen.  Dennison Nancy RN  October 24, 2009 9:36 AM

## 2010-02-28 NOTE — Assessment & Plan Note (Signed)
Summary: bradycardia/last seen in 07 by bensimhon: heart monitor   Referring Provider:  Jamie Brookes Primary Provider:  Edd Arbour   History of Present Illness: Ms Pe is a pleasant 60 yo AAF with a h/o chronic fatigue syndrome, depression, and bradycardia who presents today for EP consultation.   She reports having chronic fatigue diagnosed in 1997.  She reports feeling tired throughout the day and sleepy.  She does not feel well rested when she wakes up.  She has difficulty sleeping.  She feels that she has difficulty working due to "giving out" during the day.  She was recently initiated on celexa for depression.  SHe had a sleep study 2009 which revealed intervals of wakefulness and poor sleep deficiency consistent with her history that she wakes often and wakes fatigued.  These awakenings were felt to be nonspecific, not clearly associated with either respiratory or movement events.  Mild snoring with oxygen desaturation to a nadir of 93% was observed and no specific therapy was advised. The patient denies symptoms of palpitations, chest pain, shortness of breath, orthopnea, PND, lower extremity edema, dizziness, presyncope, syncope, or neurologic sequela. The patient is tolerating medications without difficulties and is otherwise without complaint today.   Current Medications (verified): 1)  Ibuprofen 800 Mg Tabs (Ibuprofen) .... One Tab By Mouth Q8 Hours As Needed For Back Pain 2)  Citalopram Hydrobromide 40 Mg Tabs (Citalopram Hydrobromide) .... 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  No tob, etoh or drugs Lives with friend in [place].  Review of Systems       All systems are reviewed and negative except as listed in the HPI.   Vital Signs:  Patient profile:   61 year old female Height:      68 inches Weight:      158 pounds BMI:     24.11 Pulse rate:   52 / minute BP sitting:   112 / 78  (left arm) Cuff size:   regular  Vitals Entered By: Laurance Flatten CMA (November 02, 2009 10:35 AM)  Physical Exam  General:  Well developed, well nourished, in no acute distress. Head:  normocephalic and atraumatic Eyes:  PERRLA/EOM intact; conjunctiva and lids normal. Mouth:  Teeth, gums and palate normal. Oral mucosa normal. Neck:  Neck supple, no JVD. No masses, thyromegaly or abnormal cervical nodes. Lungs:  Clear bilaterally to auscultation and percussion. Heart:  bradycardic regular rhythm, no m/r/g Abdomen:  Bowel sounds positive; abdomen soft and non-tender without masses, organomegaly, or hernias noted. No hepatosplenomegaly. Msk:  Back normal, normal gait. Muscle strength and tone normal. Pulses:  pulses normal in all 4 extremities Extremities:  No clubbing or cyanosis. Neurologic:  Alert and oriented x 3. Skin:  Intact without lesions or rashes. Cervical Nodes:  no significant adenopathy Psych:  Normal affect.   EKG  Procedure date:  11/02/2009  Findings:      sinus bradycardia 53 bpm, PR 152, QRS 102, Qtc 420, otherwise normal ekg  Impression & Recommendations:  Problem # 1:  BRADYCARDIA (ICD-427.89) Ms Corredor is a pleasant 60 yo AAF with  longstanding chronic fatigue who presents today for EP consultation regarding bradycardia.  She reports longstanding fatigue but also reports difficulty with sleep.  It is not completely clear at this point as to whether or not she is symptomatic with her bradycardia.  I have reviewed primary care's note from 09/24/09 which reveals that her TSH was  low normal, CBC has been normal. She had a Lyme titer that was  negative as well.  WIth ambulation in the office, her heart rate increases to 72 bpm.  I will place a 24 hour holter monitor to better evaluate her ambulatory heart rates at home.  IF she has a normal heart rate histogram, then no further cardiac testing is planned.  If blunted heart rate, then we will consider pacemaker implantation as an option.  Other Orders: Holter Monitor (Holter Monitor)  Patient Instructions: 1)  Your physician recommends that you continue on your current medications as directed. Please refer to the Current Medication list given to you today. 2)  Your physician has recommended that you wear a holter monitor for 24 hours.  Holter monitors are medical devices that record the heart's electrical activity. Doctors most often use these monitors to diagnose arrhythmias. Arrhythmias are problems with the speed or rhythm of the heartbeat. The monitor is a small, portable device. You can wear one while you do your normal daily activities. This is usually used to diagnose what is causing palpitations/syncope (passing out). 3)  Your physician recommends that you schedule a follow-up appointment in: 4 weeks

## 2010-02-28 NOTE — Assessment & Plan Note (Signed)
Summary: 4WK F/U SL   Visit Type:  Follow-up Referring Kalli Greenfield:  Jamie Brookes Primary Cleburn Maiolo:  Edd Arbour   History of Present Illness: Ms Peabody is a pleasant 60 yo AAF with a h/o chronic fatigue syndrome, depression, and bradycardia who presents today for EP follow-up.   She reports having chronic fatigue diagnosed in 1997.  She continues to feel tired throughout the day and sleepy.  She does not feel well rested when she wakes up.  She has difficulty sleeping.  She feels that she has difficulty working due to "giving out" during the day.  She was recently initiated on celexa for depression.  SHe had a sleep study 2009 which revealed intervals of wakefulness and poor sleep deficiency consistent with her history that she wakes often and wakes fatigued.  These awakenings were felt to be nonspecific and no specific therapy was advised. The patient denies symptoms of palpitations, chest pain, shortness of breath, orthopnea, PND, lower extremity edema, dizziness, presyncope, syncope, or neurologic sequela. The patient is tolerating medications without difficulties and is otherwise without complaint today.   Current Medications (verified): 1)  Ibuprofen 800 Mg Tabs (Ibuprofen) .... One Tab By Mouth Q8 Hours As Needed For Back Pain 2)  Citalopram Hydrobromide 40 Mg Tabs (Citalopram Hydrobromide) .... Take 1 Pill Daily For Fatigue and Mood  Allergies (verified): No Known Drug Allergies  Past History:  Past Medical History: Reviewed history from 11/02/2009 and no changes required. 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 BTL 20 yrs ago - 07/08/2004  Social History: Reviewed history from 11/02/2009 and no changes required. 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  No tob, etoh or drugs Lives with friend in  [place].  Review of Systems       All systems are reviewed and negative except as listed in the HPI.   Vital Signs:  Patient profile:   60 year old female Height:      68 inches Weight:      156 pounds BMI:     23.81 Pulse rate:   64 / minute BP sitting:   90 / 60  (left arm)  Vitals Entered By: Laurance Flatten CMA (December 06, 2009 10:03 AM)  Physical Exam  General:  alert, well-developed, and well-nourished.   Head:  normocephalic and atraumatic.   Eyes:  vision grossly intact.   Mouth:  Teeth, gums and palate normal. Oral mucosa normal. Neck:  Neck supple, no JVD. No masses, thyromegaly or abnormal cervical nodes. Lungs:  Clear bilaterally to auscultation and percussion. Heart:  bradycardic regular rhythm, no m/r/g Abdomen:  Bowel sounds positive; abdomen soft and non-tender without masses, organomegaly, or hernias noted. No hepatosplenomegaly. Msk:  Back normal, normal gait. Muscle strength and tone normal. Pulses:  pulses normal in all 4 extremities Extremities:  No clubbing or cyanosis. Neurologic:  Alert and oriented x 3. Additional Exam:  GXT performed today according to Bruce Standard Protochol to assess heart rate response to exercise. Presenting EKG was sinus bradycardia 59 bpm, QTc 411, without ischemic changes. She was quite uncoordinated on the treadmill and had difficulty walking on the treadmill due to fear, anxiety, and an uncoordinated effort. Upon initiation of ambulation, her heart rate quickly increased, though her exercise effort was limited.  Her maximum heart rate achieved was 119 bpm at 1: minute 15 seconds.  Total exercise  time was 2:17 seconds.  The study was terminated due to her inability to walk on the treadmill and concerns that she might fall off.  BP during exercise 148/82.   No ischemic changes or symptoms of ischemia.  Occasional PVCs.   Impression & Recommendations:  Problem # 1:  BRADYCARDIA (ICD-427.89) The patient has bradycardia of  unclear significance.  I do not feel that this is the cause for her chronic fatigue and daytime somnolence. I have reviewed her holter monitor from 11/08/09 which reveals sinus rhythm.  Her average HR was 56 bpm with heart rate range 40-110 bpm.  The lower heart rates were mostly when sleeping.  Her heart rate histogram appeared preserved. I put her on a treadmill in the office today. She was very uncoordinated on the treadmill and had difficulty walking.  Nevertheless, with walking (stage I) she quickly increased her heart rate into the 110s.  She could not go past stage 1 due to fear of falling off of the treadmill and therefore did not reach target heart rate.  She did not have ischemic changes.  Though not a full exercise study, this study indicates an ability to mount an adequate heart rate to activity.  I therefore do not recommend a pacemaker at this time.  We will obtain an echo.  If normal, no further CV testing is recommended. She will follow-up with Dr Rivka Safer.  Pt may benefit from ritalin for treatment of fatigue.  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  Echocardiography is a painless test that uses sound waves to create images of your heart. It provides your doctor with information about the size and shape of your heart and how well your heart's chambers and valves are working.  This procedure takes approximately one hour. There are no restrictions for this procedure. Get this in the next few days

## 2010-02-28 NOTE — Procedures (Signed)
Summary: Summary Report  Summary Report   Imported By: Erle Crocker 11/30/2009 10:47:49  _____________________________________________________________________  External Attachment:    Type:   Image     Comment:   External Document

## 2010-02-28 NOTE — Miscellaneous (Signed)
Summary: walk in  Clinical Lists Changes came in c/o fatigue & dizziness x 2 weeks/ placed with Dr. Hulen Luster to be seen now.Golden Circle RN  September 03, 2009 1:48 PM

## 2010-02-28 NOTE — Progress Notes (Signed)
Summary: 24 hour holter monitor  Phone Note Outgoing Call Call back at [facility] Phone [phone]   Call placed by: Stanton Kidney, EMT-P,  November 06, 2009 5:13 PM Action Taken: Phone Call Completed Summary of Call: Pt scheduled for 24 hour holter on 11/08/09.Stanton Kidney, EMT-P  November 06, 2009 5:13 PM      Appended Document: 24 hour holter monitor called pt to go over monitor results SR avg HR 56bpm HR ranges from 40-110bpm lmom for pt to call me back

## 2010-02-28 NOTE — Miscellaneous (Signed)
Summary: Appointment Canceled  Appointment status changed to canceled by [employer] on 01/15/2010 8:29 AM.  Cancellation Comments --------------------- echo/dx.427.89/mj  Appointment Information ----------------------- Appt Type:  CARDIOLOGY ANCILLARY VISIT      Date:  Tuesday, January 15, 2010      Time:  11:30 AM for 60 min   Urgency:  Routine   Made By:  Pearson Grippe  To Visit:  [id number]    Reason:  echo/dx.427.89/mj  Appt Comments ------------- -- 01/15/10 8:29: ([facility]) CANCELED -- echo/dx.427.89/mj -- 01/07/10 9:08: ([facility]) BOOKED -- Routine CARDIOLOGY ANCILLARY VISIT at 01/15/2010 11:30 AM for 60 min echo/dx.427.89/mj

## 2010-02-28 NOTE — Assessment & Plan Note (Signed)
Summary: discuss Celexa and also cardiology visit/ls   Vital Signs:  Patient profile:   60 year old female Height:      68 inches Weight:      151.9 pounds BMI:     23.18 Temp:     98.6 degrees F oral Pulse rate:   52 / minute BP sitting:   105 / 63  (left arm) Cuff size:   large  Vitals Entered By: Jimmy Footman, CMA (December 17, 2009 1:27 PM) CC: discuss Celexa, cardiology visit f/u Is Patient Diabetic? No Pain Assessment Patient in pain? no        Referring Provider:  Jamie Brookes Primary Provider:  Edd Arbour  CC:  discuss Celexa and cardiology visit f/u.  History of Present Illness: 1. depression and fatigue  patient is improved on celexa. we will go up to the maximum dose of 60 mg daily. The patient has already been taking higher doses on her own. She was advised not to take any higher a dose than 60mg . She says she is sleeping more. she feels better and is goiong to try get back to work.  2. bradycardia  thoroughly evaluated by cardiology. did not feel there was any medication or eblation needed. Still pending an echocardiogram. We will hold off of any medication for now, such as ritalin.  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  Orders: [facility]- Est Level  3 ([address])  Problem # 2:  BRADYCARDIA (ICD-427.89) appreciate Dr. Jenel Lucks assistance. So far she looks to have a physiologic bradycardia that is not dangerous. echo pending  Orders: [facility]- Est Level  3 ([address])  Complete Medication List: 1)  Ibuprofen 800 Mg Tabs (Ibuprofen) .... One tab by mouth q8 hours as needed for back pain 2)  Citalopram Hydrobromide 40 Mg Tabs (Citalopram hydrobromide) .... Take 1and a half  pill daily for depression, fatigue  Patient Instructions: 1)  Please schedule a follow-up appointment in 1 month. Prescriptions: CITALOPRAM HYDROBROMIDE 40 MG TABS (CITALOPRAM HYDROBROMIDE) take 1and a half pill daily for depression, fatigue  #60 x 0   Entered and Authorized by:   Edd Arbour   Signed by:   Edd Arbour on 12/17/2009   Method used:   Electronically to        [facility] [phone]* (retail)       [address]       [place], [place]  [address]       Ph: [phone]       Fax: [phone]   RxID:   [id number]    Orders Added: 1)  [facility]- Est Level  3 [[address]]

## 2010-02-28 NOTE — Miscellaneous (Signed)
Summary: concern about Adderall  Clinical Lists Changes  patient comes to office stating she started Adderall yesterday .   states it works very well and she likes the way it makes her feel. however  the med cost $80.00 for 10 pills and she had to borrow the money to pay for it at [facility] . . the [facility] does not carry this RX .  they do carry Concerta and Vyvanse. she is wondering if she can be switched to one of these meds and will they be comparable to the Adderall. she just can't afford  adderal. [facility]  will forward mssage to MD. Theresia Lo RN  January 01, 2010 12:02 PM

## 2010-02-28 NOTE — Assessment & Plan Note (Signed)
Summary: back pain/[place]   Vital Signs:  Patient profile:   60 year old female Height:      67.75 inches Weight:      161 pounds BMI:     24.75 Temp:     97.8 degrees F oral Pulse rate:   52 / minute BP sitting:   115 / 72  (left arm) Cuff size:   regular  Vitals Entered By: Tessie Fass CMA (April 26, 2009 9:18 AM) CC: lower back pain x 5 days Is Patient Diabetic? No Pain Assessment Patient in pain? yes     Location: lower back Intensity: 8   Primary Care Provider:  Magnus Ivan MD  CC:  lower back pain x 5 days.  History of Present Illness: 60 y/o female here with flare of chronic back pain  worsened x5 days. some aching in legs on Sunday, but none since. no bowel or bladder incontinence. no numbness, tingling, paresthesias. no difficulty walking. took 800 mg ibuprofen and 1 aleve this a.m. with improvement in pain. had imaging in past consistent with degenerative changes and mild antherolisthesis at L4-L5.   Habits & Providers  Alcohol-Tobacco-Diet     Tobacco Status: never  Current Medications (verified): 1)  Trazodone Hcl 50 Mg Tabs (Trazodone Hcl) .... 1 Tab By Mouth Daily 2)  Ibuprofen 800 Mg Tabs (Ibuprofen) .... One Tab By Mouth Q8 Hours As Needed For Back Pain 3)  Acetaminophen 650 Mg Cr-Tabs (Acetaminophen) .... One Tab By Mouth Q4 Hours As Needed For Back Pain/arthritis  Allergies (verified): No Known Drug Allergies  Physical Exam  General:  older female, NAD, vitals reviewed.  Msk:  normal gait. full ROM of back with full forward flexion, extension and lateral movement. normal toe and heel walking. TTP over lower lumbar spine, near SI joints. negative straight leg raise bilaterally. 2+ DTRs.  Neurologic:  alert & oriented X3 and strength normal in all extremities.     Impression & Recommendations:  Problem # 1:  BACK PAIN (ICD-724.5) Assessment Deteriorated  recommend NSAIDs/Acetaminophen. reports having had TENS unit in past. may  benefit from PT. given rehab exercises in past. patient asks lots of questions about disability, even though she is working. reports she is going to drop some paperwork off. f/u as needed.   The following medications were removed from the medication list:    Meloxicam 15 Mg Tabs (Meloxicam) ..... 1 tab by mouth daily Her updated medication list for this problem includes:    Ibuprofen 800 Mg Tabs (Ibuprofen) ..... One tab by mouth q8 hours as needed for back pain    Acetaminophen 650 Mg Cr-tabs (Acetaminophen) ..... One tab by mouth q4 hours as needed for back pain/arthritis  Orders: [facility]- Est Level  3 ([address]) Prescriptions: ACETAMINOPHEN 650 MG CR-TABS (ACETAMINOPHEN) one tab by mouth q4 hours as needed for back pain/arthritis  #180 x 1   Entered and Authorized by:   Vivien Barretto  MD   Signed by:   Shakur Lembo  MD on 04/26/2009   Method used:   Faxed to ...       [facility] (retail)       [address] [place], [place]  [address]       Ph: [phone]       Fax: [phone]   RxID:   [id number] IBUPROFEN 800 MG TABS (IBUPROFEN) one tab by mouth q8 hours as needed for back pain  #90 x 1  Entered and Authorized by:   Lequita Asal  MD   Signed by:   Lequita Asal  MD on 04/26/2009   Method used:   Faxed to ...       [facility] (retail)       [address] [place], [place]  [address]       Ph: [phone]       Fax: [phone]   RxID:   [id number]

## 2010-02-28 NOTE — Letter (Signed)
Summary: Generic Letter  [facility]  [address]   [place], [place] [address]   Phone: [phone]  Fax: [phone]    01/04/2010 MRN: [id number]  [address] [place], [place]  [address]  To Whom it may concern,  This is to certify that Ms. Kimberly Guerrero may return to work this Monday the 12th of December, 2011.  Sincerely,   Edd Arbour MD [facility]

## 2010-02-28 NOTE — Assessment & Plan Note (Signed)
Summary: dizzy & fatigued/[place]   Vital Signs:  Patient profile:   60 year old female Height:      68 inches Weight:      165.8 pounds BMI:     25.30 Temp:     98.4 degrees F Pulse rate:   62 / minute BP sitting:   121 / 70  Vitals Entered By: Golden Circle RN (September 03, 2009 1:58 PM)  Primary Care Provider:  Edd Arbour  CC:  fatigue.  History of Present Illness: fatigue- long hx of chronic fatigue syndrome.  been to sleep study which did not show sleep apnea but did show interrupted sleep.  only works 8 hours / week due to not having a steady job.  sleeps during the day.  feels off, like something isn't quite right in her brain.  very concerned about getting disability. wants her records printed out for her.  Habits & Providers  Alcohol-Tobacco-Diet     Alcohol drinks/day: 0     Tobacco Status: never  Exercise-Depression-Behavior     Drug Use: never     Seat Belt Use: always     Sun Exposure: infrequent  Current Medications (verified): 1)  Ibuprofen 800 Mg Tabs (Ibuprofen) .... One Tab By Mouth Q8 Hours As Needed For Back Pain 2)  Amitriptyline Hcl 50 Mg Tabs (Amitriptyline Hcl) .... Take One Before Bed  Allergies (verified): No Known Drug Allergies  Social History: Risk analyst Use:  always Sun Exposure-Excessive:  infrequent Drug Use:  never  Review of Systems  The patient denies anorexia, fever, chest pain, headaches, abdominal pain, and depression.    Physical Exam  General:  VS reivewedalert, well-developed, well-nourished, and well-hydrated.   Head:  Normocephalic and atraumatic without obvious abnormalities. No apparent alopecia or balding. Lungs:  Normal respiratory effort, chest expands symmetrically. Lungs are clear to auscultation, no crackles or wheezes. Heart:  Normal rate and regular rhythm. S1 and S2 normal without gallop, murmur, click, rub or other extra sounds. Abdomen:  Bowel sounds positive,abdomen soft and non-tender without masses,  organomegaly or hernias noted.   Impression & Recommendations:  Problem # 1:  CHRONIC FATIGUE SYNDROME (ICD-780.71) Assessment Unchanged long hx which has not had any change over the years.  labs not checked since 2009, will recheck CBC, TSH, CMET today in case something has changed.  Think that depression may be part of this, though she denies loss of interest or anhedonia.   Orders: Comp Met-[facility] [id number]) CBC-[facility] ([address]) TSH-[facility] [phone]) [facility]- Est Level  3 ([address]27)  Problem # 2:  INSOMNIA (ICD-780.52) Assessment: Unchanged started amytriptilline today for sleep, should also help with chronic back pain and possibly mood.  RTC 1 month to reeval Orders: [facility]- Est Level  3 ([address])  Complete Medication List: 1)  Ibuprofen 800 Mg Tabs (Ibuprofen) .... One tab by mouth q8 hours as needed for back pain 2)  Amitriptyline Hcl 50 Mg Tabs (Amitriptyline hcl) .... Take one before bed  Patient Instructions: 1)  It was nice to meet you today 2)  It is important that you exercise reguarly at least 30 minutes 5 times a week. If you develop chest pain, have severe difficulty breathing, or feel very tired, stop exercising immediately and seek medical attention.  3)  No naps during the day 4)  Try amitryptilline for sleep 5)  Come back to see me in 1 month to see how its going Prescriptions: AMITRIPTYLINE HCL 50 MG TABS (AMITRIPTYLINE HCL) take one before bed  #  30 x 11   Entered and Authorized by:   Ellery Plunk MD   Signed by:   Ellery Plunk MD on 09/03/2009   Method used:   Faxed to ...       [facility] (retail)       [address] [place], [place]  [address]       Ph: [phone]       Fax: [phone]   RxID:   [id number]

## 2010-02-28 NOTE — Letter (Signed)
Summary: Out of Work  [facility]  [address]   [place], [place] [address]   Phone: [phone]  Fax: [phone]    October 29, 2009   Employee:  GWENDOLYN MCLEES    To Whom It May Concern:   For Medical reasons, please excuse the above named employee from work for the following dates:  Start:   Oct 29, 2009  End:   Oct 30, 2009  Ms. Henriquez may return to work on Wed Oct 31, 2009 without any restrictions.   If you need additional information, please feel free to contact our office.         Sincerely,    Milinda Antis MD

## 2010-02-28 NOTE — Letter (Signed)
Summary: Generic Letter  [facility]  [address]   [place], [place] [address]   Phone: [phone]  Fax: [phone]    09/18/2009  TABETHA HARAWAY [address] [address] [place]  [address]  Dear Ms. Yetta Barre,    I wanted to let you know that your labs were normal.  I will see you at your next appointment.  Please call the office with any questions.       Sincerely,   Ellery Plunk MD  Appended Document: Generic Letter mailed

## 2010-02-28 NOTE — Miscellaneous (Signed)
Summary: work note  Patient wants a note to say that she can go back to work on Monday.  She is feeling much better.  She does plan to keep her appt with you on the 19th.   Bradly Bienenstock  January 03, 2010 12:18 PM

## 2010-02-28 NOTE — Progress Notes (Signed)
Summary: phn msg  Phone Note Call from Patient Call back at Home Phone [phone]   Caller: Patient Summary of Call: needs to talk to nurse about one of her meds. Initial call taken by: De Nurse,  January 09, 2010 4:12 PM  Follow-up for Phone Call        patient states she took concerta today for the first time. after she took it,  it made her very weak and sleepy. states her chest hurt for 10 minutes right after she took the medicine. she has had no chest pain since.  she states she is not going to take it again.    also states she thinks the Adderal really was not working either. she took it for 10 days and then she started feeling weird, very nervous. she has an appointment scheduled with Dr. Rivka Safer 01/14/2010 to follow up and discuss. Follow-up by: Theresia Lo RN,  January 09, 2010 4:23 PM  Additional Follow-up for Phone Call Additional follow up Details #1::        ok sounds good. Additional Follow-up by: Edd Arbour,  January 10, 2010 12:02 PM

## 2010-02-28 NOTE — Miscellaneous (Signed)
Summary: wants f/u appt  Clinical Lists Changes she states she was here yesterday & wants to make to 2wk f/u appt. pcp has nothing this month. to pcp to see which md he would like to have her see. FYI to Dennison Nancy RN as pcp is already overbooked for clinics this month.Golden Circle RN  September 25, 2009 1:55 PM   she came in to discuss her Celexa which she started Tuesday. c/o slight diarrhea that pm & next day was dizzy. she took a pill this am but wants to quit. told her the diarrhea may be from the pill or a virus she may have caught. it is ok now so should be able to keep taking the pill. she drank lots of water & the dizziness went away. explained it may take a few weeks to 2 months before she has full effect of the antidepressant. asked her to take with food & call me to tell me how she is doing in a wk. she takes St. John's Wort. told her to stop taking this until we have an answer from her md. may continue the geritol & B vit. she was slightly tearful during the visit. smiling & agreed to take med at end of visit. to PCP for input & if she can take St. John's wort.Golden Circle RN  September 27, 2009 9:08 AM

## 2010-02-28 NOTE — Assessment & Plan Note (Signed)
Summary: f/u to go back to work/ white team dr not avail/kh   Vital Signs:  Patient profile:   60 year old female Height:      68 inches Weight:      160 pounds BMI:     24.42 BSA:     1.86 Temp:     98.6 degrees F Pulse rate:   66 / minute BP sitting:   130 / 77  Vitals Entered By: Jone Baseman CMA (October 29, 2009 11:40 AM) CC: f/u go back to work/ cold Is Patient Diabetic? No Pain Assessment Patient in pain? no        Primary Care Provider:  Edd Arbour  CC:  f/u go back to work/ cold.  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 One Tab By Mouth Q8 Hours As Needed For Back Pain 2)  Citalopram Hydrobromide 40 Mg Tabs (Citalopram Hydrobromide) .... 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 EOMI. Perrla.  Ears:  TM difficult to visualzie secondary to soft wax, bilat, no erythema in  canals, normal hearing Nose:  boggy turbinates, clear rhinorrhea Mouth:  MMM Neck:  Supple, no LAD Lungs:  Normal respiratory effort, chest expands symmetrically. Lungs are clear to auscultation, no crackles or wheezes. Heart:  HR 60's regular rate and rhythem Psych:  Oriented X3 and good eye contact.  Slightly depressed affect, no apparrent hallucinations   Impression & Recommendations:  Problem # 1:  VIRAL URI (ICD-465.9) Assessment New  supportive care , given additional day out of work see instuctions Her updated medication list for this problem includes:    Ibuprofen 800 Mg Tabs (Ibuprofen) ..... One tab by mouth q8 hours as needed for back pain  Orders: [facility]- Est  Level 4 ([address])  Problem # 2:  BRADYCARDIA (ICD-427.89) Assessment: Unchanged  HR 60's today, referral pending, given red flags to seek care, stable to return to work  Orders: [facility]- Est  Level 4 ([address])  Complete Medication List: 1)  Ibuprofen 800 Mg Tabs (Ibuprofen) .... One tab by mouth q8 hours as needed for back pain 2)  Citalopram Hydrobromide 40 Mg Tabs (Citalopram hydrobromide) .... Take 1 pill daily for fatigue and mood  Patient Instructions: 1)  Continue your celexa 2)  Try the nasal saline for your nose or  try vicks  3)  Drink plenty of fluids 4)  Follow up with Dr. Rivka Safer for your mood in 2 weeks 5)  We will call you with the cardiology appt, if you feel weak, have chest pain, or have difficulty breathing go to the ER Prescriptions: CITALOPRAM HYDROBROMIDE 40 MG TABS (CITALOPRAM HYDROBROMIDE) take 1 pill daily for fatigue and mood  #31 x 3   Entered and Authorized by:   Milinda Antis MD   Signed by:   Milinda Antis MD on 10/29/2009   Method used:   Electronically to        [facility] [phone]* (retail)       [address]       [place], [place]  [address]       Ph: [phone]       Fax: [phone]   RxID:   [id number]

## 2010-02-28 NOTE — Consult Note (Signed)
Summary: [facility] Referral Request   [facility] Referral Request   Imported By: Roderic Ovens 11/07/2009 11:52:14  _____________________________________________________________________  External Attachment:    Type:   Image     Comment:   External Document

## 2010-02-28 NOTE — Miscellaneous (Signed)
Summary: Appointment Canceled  Appointment status changed to canceled by [employer] on 12/24/2009 8:16 AM.  Cancellation Comments --------------------- echo/dx 427.89/mt  Appointment Information ----------------------- Appt Type:  CARDIOLOGY ANCILLARY VISIT      Date:  Monday, January 07, 2010      Time:  2:00 PM for 60 min   Urgency:  Routine   Made By:  Pearson Grippe  To Visit:  [id number]    Reason:  echo/dx 427.89/mt  Appt Comments ------------- -- 12/24/09 8:16: ([facility]) CANCELED -- echo/dx 427.89/mt -- 12/21/09 13:04: ([facility]) BOOKED -- Routine CARDIOLOGY ANCILLARY VISIT at 01/07/2010 2:00 PM for 60 min echo/dx 427.89/mt -- 12/21/09 13:03: ([facility]) BOOKED -- Routine CARDIOLOGY ANCILLARY VISIT a

## 2010-02-28 NOTE — Assessment & Plan Note (Signed)
Summary: f/u meds. double booked per pcp/[facility]   Vital Signs:  Patient profile:   60 year old female Height:      68 inches Weight:      161.1 pounds BMI:     24.58 Temp:     98.1 degrees F oral Pulse rate:   57 / minute BP sitting:   121 / 68  (left arm) Cuff size:   regular  Vitals Entered By: Garen Grams LPN (October 12, 2009 1:42 PM) CC: f/u meds Is Patient Diabetic? No Pain Assessment Patient in pain? yes     Location: back   Primary Provider:  Edd Arbour  CC:  f/u meds.  History of Present Illness: Pt. is a 60 y/o female with depression and fatigue who comes in after 2 weeks. She started Celexa. The patient says she had diarrhea and dizziness with the medication for the first week. The second week improved and she has more energy and a better mood already.   Preventive Screening-Counseling & Management  Alcohol-Tobacco     Alcohol drinks/day: 0     Smoking Status: never  Problems Prior to Update: 1)  Depression, Moderate, Recurrent  (ICD-296.32) 2)  Fracture, Toe, Left  (ICD-826.0) 3)  Bunions, Bilateral  (ICD-727.1) 4)  Screening For Malignant Neoplasm of The Cervix  (ICD-V76.2) 5)  Chronic Fatigue Syndrome  (ICD-780.71) 6)  Insomnia  (ICD-780.52) 7)  Back Pain  (ICD-724.5)  Medications Prior to Update: 1)  Ibuprofen 800 Mg Tabs (Ibuprofen) .... One Tab By Mouth Q8 Hours As Needed For Back Pain 2)  Celexa 20 Mg Tabs (Citalopram Hydrobromide) .... 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  No tob, etoh or drugs Lives with friend.    Risk Factors: Alcohol Use: 0 (10/12/2009)  Risk  Factors: Smoking Status: never (10/12/2009)  Family History: Reviewed history from 03/26/2006 and no changes required. No MI, DM, or stroke  Social History: Reviewed history from 06/15/2007 and no changes required. 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  No tob, etoh or drugs Lives with friend.    Review of Systems       The patient complains of chest pain, difficulty walking, and depression.  The patient denies fever and syncope.    Physical Exam  General:  Well-developed,well-nourished,in no acute distress; alert,appropriate and cooperative throughout examination Head:  Normocephalic and atraumatic without obvious abnormalities. No apparent alopecia or balding. Eyes:  No corneal or conjunctival inflammation noted. EOMI. Perrla. Funduscopic exam benign, without hemorrhages, exudates or papilledema. Vision grossly normal. Neck:  No deformities, masses, or tenderness noted. Lungs:  Normal respiratory effort, chest expands symmetrically. Lungs are clear to auscultation, no crackles or wheezes. Heart:  Normal rate and regular rhythm. S1 and S2 normal without gallop, murmur, click, rub or other extra sounds.   Impression & Recommendations:  Problem # 1:  DEPRESSION, MODERATE, RECURRENT (ICD-296.32) Assessment Improved  Patient will continue on the same dose of Celexa. I will give her refills. She has improved mood and energy levels.  Orders: [facility]- Est Level  3 ([address])  Problem # 2:  BACK PAIN (ICD-724.5) Assessment: Unchanged  Her updated medication list for this problem includes:    Ibuprofen 800 Mg Tabs (Ibuprofen) ..... One tab by mouth q8 hours as needed for back pain She will continue to take the ibuprofen  Orders: [facility]- Est Level  3 ([address])  Complete Medication List: 1)  Ibuprofen 800 Mg Tabs (Ibuprofen) .... One tab by mouth q8 hours as needed for back pain 2)  Celexa 20 Mg Tabs (Citalopram hydrobromide) .... One tab daily  in the morning  Patient Instructions: 1)  It was great seeing you. 2)  Please follow up in 2 weeks to evaluate your depression and Celexa dosage. Prescriptions: CELEXA 20 MG TABS (CITALOPRAM HYDROBROMIDE) one tab daily in the morning  #30 x 0   Entered and Authorized by:   Edd Arbour   Signed by:   Edd Arbour on 10/12/2009   Method used:   Faxed to ...       [facility] Department (retail)       [address] [place], [place]  [address]       Ph: [phone]       Fax: [phone]   RxID:   [id number]

## 2010-02-28 NOTE — Consult Note (Signed)
Summary: Disability Evaluation  Disability Evaluation   Imported By: De Nurse 11/01/2009 14:14:18  _____________________________________________________________________  External Attachment:    Type:   Image     Comment:   External Document

## 2010-02-28 NOTE — Miscellaneous (Signed)
Summary: walkin  Clinical Lists Changes c/o low back pain radiating to legs. 8/10. took aleve & 800mg  ibuprofen this am. advised against taking both at the dsame time. to make sure she has food in her stomach when she takes either. hx back problems. seeking disability. acute pain started last Sunday. gait is off when seen walking down the hall. to be seen now for this.Golden Circle RN  April 26, 2009 9:10 AM   states the meloxicam makes her very nauseous & vomits. md to add to allergy list.

## 2010-02-28 NOTE — Assessment & Plan Note (Signed)
Summary: F/U  Kimberly Guerrero   Vital Signs:  Patient profile:   60 year old female Weight:      150 pounds Temp:     98.5 degrees F oral Pulse rate:   63 / minute Pulse rhythm:   regular BP sitting:   113 / 70  (left arm) Cuff size:   regular  Vitals Entered By: Loralee Pacas CMA (January 14, 2010 2:51 PM) CC: meds Comments pt stated that the concerta made her really sleepy and the adderall made her feel funny   Referring Provider:  Jamie Brookes Primary Provider:  Edd Arbour  CC:  meds.  History of Present Illness: 1. chronic fatigue syndrome stopped the concerta because it made her more sleepy. The adderral was effective, but it made her feel weird so she stoppped it. I think this is secondary to depression.   2. depression - increased dose of celexa to 80 mg daily. Referred her to Dr. Pascal Lux for discussion about depression. The patient is fatigued and is giving up. She is tearful in the office. She seems hopeless. She has underlying psychological personality stuff that is just below the surface.  Allergies: No Known Drug Allergies  Review of Systems       see hpi  Physical Exam  General:  Well-developed,well-nourished,in no acute distress; alert,appropriate and cooperative throughout examination   Impression & Recommendations:  Problem # 1:  DEPRESSION, MODERATE, RECURRENT (ICD-296.32) increased celexa to 80mg  daily referred to Dr. Pascal Lux.  Orders: [facility]- Est Level  3 ([address])  Problem # 2:  CHRONIC FATIGUE SYNDROME (ICD-780.71) continue with celexa  Complete Medication List: 1)  Ibuprofen 800 Mg Tabs (Ibuprofen) .... One tab by mouth q8 hours as needed for back pain 2)  Citalopram Hydrobromide 40 Mg Tabs (Citalopram hydrobromide) .... Take two pills daily for depression, fatigue  Patient Instructions: 1)  Please Schedule an appointment to see Dr. Pascal Lux. 2)  Please follow up with me in one month. 3)  Im glad to see you, don't give up. 4)  It is important that you  exercise regularly at least 20 minutes 5 times a week. If you develop chest pain, have severe difficulty breathing, or feel very tired , stop exercising immediately and seek medical attention. Prescriptions: CITALOPRAM HYDROBROMIDE 40 MG TABS (CITALOPRAM HYDROBROMIDE) take two pills daily for depression, fatigue  #80 x 1   Entered and Authorized by:   Edd Arbour   Signed by:   Edd Arbour on 01/14/2010   Method used:   Electronically to        [facility] [phone]* (retail)       [address]       [place], [place]  [address]       Ph: [phone]       Fax: [phone]   RxID:   [id number]    Orders Added: 1)  [facility]- Est Level  3 [[address]]

## 2010-02-28 NOTE — Letter (Signed)
Summary: Out of Work  [facility], Main Office  [address]   [place], [place] [address]   Phone: [phone]  Fax: [phone]    December 06, 2009   Employee:  OVEDA DADAMO    To Whom It May Concern:   For Medical reasons, please excuse the above named employee from work for the following dates: 12/06/2009.  Ms. Marano  may return to work on 12/07/2009    If you need additional information, please feel free to contact our office.         Sincerely,   Laurance Flatten, CMA    Laurance Flatten CMA

## 2010-03-01 ENCOUNTER — Telehealth: Payer: Self-pay | Admitting: Family Medicine

## 2010-03-06 NOTE — Progress Notes (Addendum)
Summary: refill  Phone Note Refill Request Message from:  Patient  Refills Requested: Medication #1:  CITALOPRAM HYDROBROMIDE 40 MG TABS take two pills daily for depression   Supply Requested: 1 month   Notes: pharm only has it as 1 a day - needs to be for 2 per day being enrolled into [facility] program and they will be sending request for name brand for them to help her.  needs enough of generic to get her through [facility] [address]  Initial call taken by: De Nurse,  March 01, 2010 9:57 AM    New/Updated Medications: CITALOPRAM HYDROBROMIDE 40 MG TABS (CITALOPRAM HYDROBROMIDE) Take one tablet by mouth daily. Prescriptions: CITALOPRAM HYDROBROMIDE 40 MG TABS (CITALOPRAM HYDROBROMIDE) Take one tablet by mouth daily.  #30 x 0   Entered and Authorized by:   Tawanna Cooler Rahmir Beever MD   Signed by:   Tawanna Cooler Gerold Sar MD on 03/01/2010   Method used:   Electronically to        [id number]* (retail)       [address]       [place], [place]  [address]       Ph: [phone]       Fax: [phone]   RxID:   [id number] CITALOPRAM HYDROBROMIDE 40 MG TABS (CITALOPRAM HYDROBROMIDE) take two pills daily for depression, fatigue  #80 x 1   Entered and Authorized by:   Tawanna Cooler Viviano Bir MD   Signed by:   Tawanna Cooler Alaena Strader MD on 03/01/2010   Method used:   Print then Give to Patient   RxID:   [id number]  will ask Dr. Eiko Mcgowen about this refill. to send for one month supply for generic to [facility] [address]  and then another RX for brand name to send to MAP. Theresia Lo RN  March 01, 2010 10:17 AM  Acquanetta Belling, MD Wrote: Citalopram is not approved for doses over 40 mg daily.   I will refill her Citalopram, but only at 40 mg daily for one month.  She can obtain a 30 day supply for $4 at [facility].  Vandy Fong MD  March 01, 2010 11:24 AM    Appended Document: refill    message left for patient to call back so I can inform her of change in medication dose. Theresia Lo RN  March 01, 2010 12:08 PM    notified patient of change  in Citolapram Rx to one 40 mg tab daily.   Theresia Lo RN  March 01, 2010 2:42 PM  Medications: Rx of CITALOPRAM HYDROBROMIDE 40 MG TABS (CITALOPRAM HYDROBROMIDE) Take one tablet by mouth daily.;  #30 x 0;  Signed;  Entered by: Edd Arbour;  Authorized by: Theresia Lo RN;  Method used: Faxed to [facility], [address] [place], [place], [place]  [address], Ph: [phone], Fax: [phone]    Prescriptions: CITALOPRAM HYDROBROMIDE 40 MG TABS (CITALOPRAM HYDROBROMIDE) Take one tablet by mouth daily.  #30 x 0   Entered by:   Edd Arbour   Authorized by:   Theresia Lo RN   Signed by:   Edd Arbour on 03/04/2010   Method used:   Faxed to ...       [facility] Department (retail)       [address] [place], [place]  [address]       Ph: [phone]       Fax: [phone]   RxID:  [id number]   

## 2010-03-07 ENCOUNTER — Encounter: Payer: Self-pay | Admitting: *Deleted

## 2010-03-24 ENCOUNTER — Inpatient Hospital Stay (INDEPENDENT_AMBULATORY_CARE_PROVIDER_SITE_OTHER)
Admission: RE | Admit: 2010-03-24 | Discharge: 2010-03-24 | Disposition: A | Discharge status: Home / Self Care | Payer: Self-pay | Source: Ambulatory Visit | Attending: Family Medicine | Admitting: Family Medicine

## 2010-03-24 DIAGNOSIS — H1130 Conjunctival hemorrhage, unspecified eye: Secondary | ICD-10-CM

## 2010-04-02 ENCOUNTER — Other Ambulatory Visit: Payer: Self-pay | Admitting: Family Medicine

## 2010-04-02 NOTE — Telephone Encounter (Signed)
Refill request

## 2010-04-04 NOTE — Telephone Encounter (Signed)
Please review and refill

## 2010-04-08 LAB — COMPREHENSIVE METABOLIC PANEL
AST: 21 U/L (ref 0–37)
BUN: 16 mg/dL (ref 6–23)
CO2: 26 mEq/L (ref 19–32)
Chloride: 102 mEq/L (ref 96–112)
GFR calc Af Amer: >60 mL/min (ref >60)
GFR calc non Af Amer: >60 mL/min (ref >60)
Total Bilirubin: 0.5 mg/dL (ref 0.3–1.2)
Total Protein: 6.7 g/dL (ref 6.0–8.3)

## 2010-04-08 LAB — LIPID PANEL
LDL Cholesterol: 104 mg/dL — ABNORMAL HIGH (ref 0–99)
Total CHOL/HDL Ratio: 2.5 RATIO
Triglycerides: 65 mg/dL (ref <150)
VLDL: 13 mg/dL (ref 0–40)

## 2010-04-08 LAB — CARDIAC PANEL(CRET KIN+CKTOT+MB+TROPI)
CK, MB: 1 ng/mL (ref 0.3–4.0)
Relative Index: 0.8 (ref 0.0–2.5)
Relative Index: INVALID (ref 0.0–2.5)
Total CK: 132 U/L (ref 7–177)
Total CK: 97 U/L (ref 7–177)
Troponin I: <0.01 ng/mL (ref 0.00–0.06)

## 2010-04-08 LAB — D-DIMER, QUANTITATIVE: D-Dimer, Quant: 1.17 ug/mL-FEU — ABNORMAL HIGH (ref 0.00–0.48)

## 2010-04-08 LAB — CBC
HCT: 37.6 % (ref 36.0–46.0)
Hemoglobin: 12.1 g/dL (ref 12.0–15.0)
MCH: 27.1 pg (ref 26.0–34.0)
MCHC: 32.2 g/dL (ref 30.0–36.0)
RBC: 4.46 MIL/uL (ref 3.87–5.11)
RDW: 15 % (ref 11.5–15.5)

## 2010-04-08 LAB — TSH: TSH: 0.688 u[IU]/mL (ref 0.350–4.500)

## 2010-04-08 LAB — CK TOTAL AND CKMB (NOT AT ARMC): Relative Index: 0.6 (ref 0.0–2.5)

## 2010-06-03 ENCOUNTER — Telehealth: Payer: Self-pay | Admitting: Family Medicine

## 2010-06-03 ENCOUNTER — Other Ambulatory Visit: Payer: Self-pay | Admitting: Family Medicine

## 2010-06-03 MED ORDER — DIPHENHYDRAMINE-ZINC ACETATE 1-0.1 % EX CREA: TOPICAL_CREAM | Freq: Two times a day (BID) | CUTANEOUS | Status: AC | PRN

## 2010-06-03 MED ORDER — DIPHENHYDRAMINE-ZINC ACETATE 1-0.1 % EX CREA: TOPICAL_CREAM | Freq: Two times a day (BID) | CUTANEOUS | Status: DC | PRN

## 2010-06-03 NOTE — Telephone Encounter (Signed)
Pt was prescribed a cream for itching, says its not working & would like something else. Goes to [facility]

## 2010-06-11 NOTE — Procedures (Signed)
NAME:  Kimberly Guerrero, Kimberly Guerrero                 ACCOUNT NO.:  [id number]   MEDICAL RECORD NO.:  [id number]          PATIENT TYPE:  OUT   LOCATION:  SLEEP CENTER                 FACILITY[facility]  [facility]   PHYSICIAN:  Clinton D. Maple Hudson, MD, FCCP, FACPDATE OF BIRTH:  10/14/1950   DATE OF STUDY:  09/01/2007                            NOCTURNAL POLYSOMNOGRAM   REFERRING PHYSICIAN:  Johney Maine, M.D.   INDICATION FOR STUDY:  Hypersomnia with sleep apnea.   EPWORTH SLEEPINESS SCORE:  12/24, BMI 21.4, weight 145 pounds, height 69  inches, neck 14 inches.   MEDICATIONS:  No home medications are listed.   SLEEP ARCHITECTURE:  Total sleep time 225 minutes with sleep efficiency  56.9%.  Stage I was 19.7%, stage II 64.5%, stage III absent.  REM 15.7%  of total sleep time.  Sleep latency 22 minutes, REM latency 80 minutes.  Awake after sleep onset 126 minutes.  Arousal index 19.7.  No bedtime  medications were taken.  There was sustained wakefulness between  midnight and 1:30 a.m. and after 4 a.m.   RESPIRATORY DATA:  Apnea/hypopnea index (AHI) 0.8/hour.  Respiratory  disturbance index (RDI) 2.1 per hour.  Total of three events were scored  including two obstructive apneas and one hypopnea.  Events were  nonsupine and associated with REM.   OXYGEN DATA:  Mild snoring with oxygen desaturation to a nadir of 93%.  Mean oxygen saturation through the study was 96.8% on room air.   CARDIAC DATA:  Normal sinus rhythm.   MOVEMENT-PARASOMNIA:  No significant movement disturbance.  Bathroom x2.   IMPRESSIONS-RECOMMENDATIONS:  1. Intervals of wakefulness and poor sleep deficiency consistent with      her history that she wakes often and wakes fatigued.  These      awakenings are nonspecific, not clearly associated with either      respiratory or movement events.  Mild snoring with oxygen      desaturation to a nadir of 93%.  2. 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  Further inquiry about      sleep habits and  home sleep conditions may be helpful.      Clinton D. Maple Hudson, MD, [facility], FACP  Diplomate, Biomedical engineer of Sleep Medicine  Electronically Signed     CDY/MEDQ  D:  09/11/2007 10:41:36  T:  09/11/2007 10:51:48  Job:  [id number]

## 2010-07-19 ENCOUNTER — Ambulatory Visit (HOSPITAL_COMMUNITY)
Admission: RE | Admit: 2010-07-19 | Discharge: 2010-07-19 | Disposition: A | Discharge status: Home / Self Care | Payer: Self-pay | Source: Ambulatory Visit | Attending: Family Medicine | Admitting: Family Medicine

## 2010-07-19 ENCOUNTER — Ambulatory Visit (INDEPENDENT_AMBULATORY_CARE_PROVIDER_SITE_OTHER): Payer: Self-pay | Admitting: Family Medicine

## 2010-07-19 VITALS — BP 104/69 | HR 50 | Ht 69.0 in | Wt 164.3 lb

## 2010-07-19 DIAGNOSIS — M25569 Pain in unspecified knee: Secondary | ICD-10-CM | POA: Insufficient documentation

## 2010-07-19 DIAGNOSIS — M25561 Pain in right knee: Secondary | ICD-10-CM | POA: Insufficient documentation

## 2010-07-19 DIAGNOSIS — M25562 Pain in left knee: Secondary | ICD-10-CM | POA: Insufficient documentation

## 2010-07-19 DIAGNOSIS — M549 Dorsalgia, unspecified: Secondary | ICD-10-CM

## 2010-07-19 MED ORDER — CYCLOBENZAPRINE HCL 10 MG PO TABS: 10.0000 mg | ORAL_TABLET | Freq: Three times a day (TID) | ORAL | Status: AC | PRN

## 2010-07-19 MED ORDER — IBUPROFEN 800 MG PO TABS: 800.0000 mg | ORAL_TABLET | Freq: Three times a day (TID) | ORAL | Status: DC | PRN

## 2010-07-19 MED ORDER — CYCLOBENZAPRINE HCL 10 MG PO TABS: 10.0000 mg | ORAL_TABLET | Freq: Three times a day (TID) | ORAL | Status: DC | PRN

## 2010-07-19 NOTE — Assessment & Plan Note (Signed)
Pt continues to have back pain. Feels a catch on the left x 1 month and the Rt side spasm for about 1 month.  Continue Ibuprofen.  Start Flexeril x 5 days.

## 2010-07-19 NOTE — Assessment & Plan Note (Signed)
Pt is having some knee pains. Last set of Xrays in 2007 were normal. Will order standing xrays to see if this is OA and how bad it is.

## 2010-07-19 NOTE — Progress Notes (Signed)
Back pain: Pt has long standing back pain and does not regularly see her PCP about it. She says she has had if for years and that she takes Advil for the pain. About 1 month ago she started having a "catch" on the left side and some Rt sided back spasm. She says the Ibuprofen has not been helping. She does does some standing, cleaning, bending over and says that her back pain can come on with any of these activites. Worse with standing for a long time.   Knee pain: Pt thinks she has osteoarthritis but does not have that officially as a diagnosis. She is having worsening pain with her knees and says that standing for a long time makes them worse. However she mentions that her pain is worse int he morning and with activity. She takes Ibuprofen for the back and knee pain but says her pain is increasing there too. She is staying functional and can do all the things she wants to do just has some pain. Pt wonders if she should get a shower chair and toilet seat to help her get up and down.   ROs: neg except as noted above.   PE:  Gen: seated comfortably on the table.  MSK: Pt has some crepitus in her knees, left and Rt paraspinal muscle tenderness and tightness. No spinal process tenderness.

## 2010-07-19 NOTE — Patient Instructions (Signed)
Start wearing supportive sneakers.  Get insoles called [employer] at [employer] in [facility] for your sneakers.  Use heating pad on the back for back spasms.

## 2010-09-13 ENCOUNTER — Other Ambulatory Visit: Payer: Self-pay | Admitting: Family Medicine

## 2010-09-13 DIAGNOSIS — Z1231 Encounter for screening mammogram for malignant neoplasm of breast: Secondary | ICD-10-CM

## 2010-09-20 ENCOUNTER — Ambulatory Visit: Payer: Self-pay | Admitting: Family Medicine

## 2010-10-11 ENCOUNTER — Ambulatory Visit (HOSPITAL_COMMUNITY)
Admission: RE | Admit: 2010-10-11 | Discharge: 2010-10-11 | Disposition: A | Discharge status: Home / Self Care | Payer: Self-pay | Source: Ambulatory Visit | Attending: Family Medicine | Admitting: Family Medicine

## 2010-10-11 DIAGNOSIS — Z1231 Encounter for screening mammogram for malignant neoplasm of breast: Secondary | ICD-10-CM | POA: Insufficient documentation

## 2010-10-18 ENCOUNTER — Ambulatory Visit: Payer: Self-pay | Admitting: Family Medicine

## 2010-10-22 ENCOUNTER — Other Ambulatory Visit: Payer: Self-pay | Admitting: *Deleted

## 2010-10-22 DIAGNOSIS — M549 Dorsalgia, unspecified: Secondary | ICD-10-CM

## 2010-10-22 MED ORDER — IBUPROFEN 800 MG PO TABS: 800.0000 mg | ORAL_TABLET | Freq: Three times a day (TID) | ORAL | Status: DC | PRN

## 2010-10-25 ENCOUNTER — Ambulatory Visit: Payer: Self-pay | Admitting: Family Medicine

## 2011-06-06 ENCOUNTER — Ambulatory Visit (HOSPITAL_COMMUNITY)
Admission: RE | Admit: 2011-06-06 | Discharge: 2011-06-06 | Disposition: A | Discharge status: Home / Self Care | Payer: Self-pay | Source: Ambulatory Visit | Attending: Family Medicine | Admitting: Family Medicine

## 2011-06-06 ENCOUNTER — Ambulatory Visit (INDEPENDENT_AMBULATORY_CARE_PROVIDER_SITE_OTHER): Payer: Self-pay | Admitting: Family Medicine

## 2011-06-06 ENCOUNTER — Encounter: Payer: Self-pay | Admitting: Family Medicine

## 2011-06-06 VITALS — BP 118/73 | HR 57 | Temp 98.1°F | Ht 69.0 in | Wt 165.0 lb

## 2011-06-06 DIAGNOSIS — M545 Low back pain, unspecified: Secondary | ICD-10-CM

## 2011-06-06 DIAGNOSIS — M25569 Pain in unspecified knee: Secondary | ICD-10-CM | POA: Insufficient documentation

## 2011-06-06 DIAGNOSIS — Z124 Encounter for screening for malignant neoplasm of cervix: Secondary | ICD-10-CM

## 2011-06-06 DIAGNOSIS — M1990 Unspecified osteoarthritis, unspecified site: Secondary | ICD-10-CM

## 2011-06-06 MED ORDER — LIDOCAINE 5 % EX PTCH: 1.0000 | MEDICATED_PATCH | CUTANEOUS | Status: AC

## 2011-06-06 MED ORDER — ACETAMINOPHEN ER 650 MG PO TBCR: 650.0000 mg | EXTENDED_RELEASE_TABLET | Freq: Three times a day (TID) | ORAL | Status: AC | PRN

## 2011-06-06 NOTE — Progress Notes (Signed)
  Subjective:   Patient ID: Kimberly Guerrero, female DOB: 12-08-1950 61 y.o. MRN: [id number] HPI:  1. Bilateral Knee pain  Onset: has been chronic  Time period of:  month(s).  Severity is described as moderate.  Course of her symptoms over time is gradual and now worse to the point she is pain daily. Aggravating: in the morning it is worse. Affects her work regimen.  Alleviating: ibuprofen not helping.  Associated sx/sn: no locking or popping. She notices pain with any movement of the knee. No rash or other joint involvement.  She has gained some weight.   2. Lower back pain  Onset: has been acute  Time period of:  week(s).  Severity is described as moderate.  Course of her symptoms over time is acute. Aggravating: none Alleviating: none Associated sx/sn: paraspinal to the right and some central pain, aching in nature. No urinary symptoms or fever. No injury mechanism  History  Substance Use Topics  . Smoking status: Never Smoker   . Smokeless tobacco: Not on file  . Alcohol Use: Not on file   Review of Systems: Pertinent items are noted in HPI.    Objective:   Filed Vitals:   06/06/11 1348  BP: 118/73  Pulse: 57  Temp: 98.1 F (36.7 C)  TempSrc: Oral  Height: 5\' 9"  (1.753 m)  Weight: 165 lb (74.844 kg)   Physical Exam: General: aaf, nad, pleasant.  Knees: FROM, no ttp, pain with AROM extension bilaterally. Negative apprehension. Negative drawers.   Back - Normal skin, Spine with normal alignment and no deformity.  No Tenderness to vertebral process palpation.  Paraspinous muscles are tender and without spasm.   Range of motion is full at neck and lumbar sacral regions  Assessment & Plan:

## 2011-06-06 NOTE — Patient Instructions (Signed)
Meds ordered this encounter  Medications  . lidocaine (LIDODERM) 5 %    Sig: Place 1 patch onto the skin daily. Remove & Discard patch within 12 hours or as directed by MD    Dispense:  30 patch    Refill:  1  . acetaminophen (TYLENOL 8 HOUR) 650 MG CR tablet    Sig: Take 1 tablet (650 mg total) by mouth every 8 (eight) hours as needed for pain.    Dispense:  90 tablet    Refill:  1   I have ordered an xray of your lower back and xrays of your knees.   Take the above medications for your back and knee pain.  You need a pap in one year.

## 2011-06-06 NOTE — Assessment & Plan Note (Signed)
lidoderm patch at patients request.  I think this is muscle related pain. Advised rest and tylenol. Will obtain films of lower back.

## 2011-06-06 NOTE — Assessment & Plan Note (Signed)
Bilateral knee pain that is affecting her daily life. Tylenol prn as prescribed. 4 view of both knees. Course unknown, may need surgery.

## 2011-06-11 ENCOUNTER — Telehealth: Payer: Self-pay | Admitting: Family Medicine

## 2011-06-11 NOTE — Telephone Encounter (Signed)
Left message for patients with the results of her Xrays - knees/lower back

## 2011-08-01 ENCOUNTER — Ambulatory Visit (INDEPENDENT_AMBULATORY_CARE_PROVIDER_SITE_OTHER): Payer: Self-pay | Admitting: Sports Medicine

## 2011-08-01 ENCOUNTER — Encounter: Payer: Self-pay | Admitting: Sports Medicine

## 2011-08-01 VITALS — BP 107/68 | HR 56 | Temp 98.4°F | Ht 69.0 in | Wt 161.6 lb

## 2011-08-01 DIAGNOSIS — M1990 Unspecified osteoarthritis, unspecified site: Secondary | ICD-10-CM

## 2011-08-01 DIAGNOSIS — I498 Other specified cardiac arrhythmias: Secondary | ICD-10-CM

## 2011-08-01 DIAGNOSIS — M999 Biomechanical lesion, unspecified: Secondary | ICD-10-CM

## 2011-08-01 DIAGNOSIS — M545 Low back pain: Secondary | ICD-10-CM

## 2011-08-01 DIAGNOSIS — J069 Acute upper respiratory infection, unspecified: Secondary | ICD-10-CM

## 2011-08-01 MED ORDER — BACLOFEN 10 MG PO TABS: 10.0000 mg | ORAL_TABLET | Freq: Every evening | ORAL | Status: AC | PRN

## 2011-08-01 NOTE — Patient Instructions (Addendum)
It was nice to me today. I sent prescription to [facility] for a muscle relaxer.  This will help with your sleep as well as help with your pain. Remember to do the exercises were taught about including the hula-hoop like exercises. Please continue taking her Celexa on a daily basis.  This will help your energy level and depression.  Please follow Dr. Rivka Safer, and 2-3 weeks to see her back pain is doing and further discuss her questions about disability.  At this point I do not see anything that prohibits you from working to encourage you to continue doing as much activity as possible.

## 2011-08-02 ENCOUNTER — Encounter: Payer: Self-pay | Admitting: Sports Medicine

## 2011-08-02 DIAGNOSIS — M999 Biomechanical lesion, unspecified: Secondary | ICD-10-CM | POA: Insufficient documentation

## 2011-08-02 NOTE — Assessment & Plan Note (Addendum)
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  Although no evidence for radiculopathy.  On exam.  Could benefit from referral pain management for fascicular block.  If persistent symptoms occur although physical therapy and further imaging would be required Initially

## 2011-08-02 NOTE — Assessment & Plan Note (Signed)
Low-back films reviewed from last visit.  No evidence of acute neurologic process on exam.  Patient reassured there is no evidence on history or exam.  That any activity should be limited and that that she will not be causing more damage.  By continuing activities of daily living were working.  Strengthening.  Her lumbar spine, as well as abdomen was discussed.  Exercises were shown.  Prescription for muscle relaxer for muscle spasm.  OMT. performed.  Per osteopathic section.  No role for narcotics.  Concern for secondary gain with frequency and concerned of asking for disability.  Once again her exercise strengthening and exercises.  May be appropriate for referral to physical therapy if continues to present

## 2011-08-02 NOTE — Progress Notes (Signed)
  [facility]  Patient name: Kimberly Guerrero MRN [id number]  Date of birth: Dec 29, 1950  CC & HPI  Kimberly Guerrero is a 61 y.o. female presenting today for followup and evaluation of low back pain.   Location  low back pain, nonradiating, no radicular symptoms   Onset  2-3 weeks, patient questions as to whether or not this happened at work although very unsure of exact onset.    Character  crampy,   Severity  7/10    Temporal  worse when she lays down at night   Alleviating  nothing has tried Tylenol and Advil and heat.    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  Questions multiple times throughout the exam and treatment if she should work and if she should be on disability because of this minor back pain   ROS  No fevers, no chills, no loss of bowel or bladder, no falls  Pertinent History Reviewed  Medical & Surgical Hx:  Reviewed: Significant for depression, recurrent back pain, chronic fatigue syndrome, Medications: Reviewed & Updated - see associated section Social History: Reviewed - Significant for nonsmoker  Objective Findings  Vitals:  Filed Vitals:   08/01/11 1421  BP: 107/68  Pulse: 56  Temp: 98.4 F (36.9 C)   PE: GENERAL:  Adult AA female.  Examined in [facility].  In no discomfort; norespiratory distress.   PSYCH: Alert and appropriately interactive; Insight:Lacking and   H&N: AT/[place], MMM, no scleral icterus, EOMi THORAX: HEART: RRR, S1/S2 heard, no murmur LUNGS: CTA B, no wheezes, no crackles EXTREMITIES: Moves all 4 extremities spontaneously, warm well perfused, 0 edema, bilateral DP and PT pulses 0/4.   Osteopathic: Neurologic: Lower extremity DTRs two out of four, strength, with no lateralization grossly and lower surety my tones, as well as 5+ out of 5 strength, negative straight leg raise Standing Flexion Test: right            Seated Flexion/ASIS  CompressionTest: right Lumbosacral Spring/Backward Bending Test: Forward  LUMBAR   right paraspinal musculature and spasm, with tender point, over right quadratus lumborum.    T3, rotated right side to side and right   SACRUM   right on right   PELVIS   right anterior innominate

## 2011-08-02 NOTE — Assessment & Plan Note (Signed)
Significant lumbar, sacral, pelvic dysfunction.  OMT performed to lumbar pelvis sacrum, including HBLA muscle energy or myofascial release and counter strain.  Patient tolerated well and was reported some improvement in her symptoms.  Educated on potential for soreness following.  To use ice and NSAIDs and muscle relaxers

## 2011-08-14 ENCOUNTER — Telehealth: Payer: Self-pay | Admitting: *Deleted

## 2011-08-14 NOTE — Telephone Encounter (Signed)
Patient needs a refill Citalopram called in to [facility] on [address].  She contacted [facility] about it, but has not heard back.

## 2011-08-15 ENCOUNTER — Other Ambulatory Visit: Payer: Self-pay | Admitting: *Deleted

## 2011-08-15 DIAGNOSIS — F329 Major depressive disorder, single episode, unspecified: Secondary | ICD-10-CM

## 2011-08-15 NOTE — Telephone Encounter (Signed)
Patient is calling back because she still hasn't gotten her refill and she has been out for several days.  She is going to contact [facility] again for another refill request to be sent but she really doesn't want to go through the weekend without it especially since she has tried to get this since last week and she said she will call back this afternoon.

## 2011-08-18 MED ORDER — CITALOPRAM HYDROBROMIDE 40 MG PO TABS: 40.0000 mg | ORAL_TABLET | Freq: Every day | ORAL | Status: DC

## 2011-08-18 NOTE — Telephone Encounter (Signed)
Patient came by office requesting refill on celexa. States she has been out for several days and has been trying to get refilled. RN sent in RX for one month supply , will send request to Dr. Jennette Kettle about any refills.

## 2011-08-19 NOTE — Telephone Encounter (Signed)
Pt has a prescription of Citalopram sent to [facility] at [facility] on 08/18/2011 with 3 refills.

## 2011-09-26 ENCOUNTER — Other Ambulatory Visit: Payer: Self-pay | Admitting: Family Medicine

## 2011-09-26 DIAGNOSIS — Z1231 Encounter for screening mammogram for malignant neoplasm of breast: Secondary | ICD-10-CM

## 2011-10-13 ENCOUNTER — Ambulatory Visit (HOSPITAL_COMMUNITY)
Admission: RE | Admit: 2011-10-13 | Discharge: 2011-10-13 | Disposition: A | Discharge status: Home / Self Care | Payer: Self-pay | Source: Ambulatory Visit | Attending: Family Medicine | Admitting: Family Medicine

## 2011-10-13 DIAGNOSIS — Z1231 Encounter for screening mammogram for malignant neoplasm of breast: Secondary | ICD-10-CM | POA: Insufficient documentation

## 2012-01-12 ENCOUNTER — Ambulatory Visit (INDEPENDENT_AMBULATORY_CARE_PROVIDER_SITE_OTHER): Payer: Self-pay | Admitting: Family Medicine

## 2012-01-12 ENCOUNTER — Encounter: Payer: Self-pay | Admitting: Family Medicine

## 2012-01-12 VITALS — BP 116/75 | HR 56 | Ht 69.0 in | Wt 164.0 lb

## 2012-01-12 DIAGNOSIS — M549 Dorsalgia, unspecified: Secondary | ICD-10-CM

## 2012-01-12 MED ORDER — DICLOFENAC SODIUM 75 MG PO TBEC: 75.0000 mg | DELAYED_RELEASE_TABLET | Freq: Two times a day (BID) | ORAL | Status: DC

## 2012-01-12 NOTE — Progress Notes (Signed)
  Subjective:    Patient ID: Kimberly Guerrero, female    DOB: 07-25-1950, 61 y.o.   MRN: [id number]  HPI  Back Pain Primarily right side lower back but also some on left.  Has had for years hurts on and off.  Has had xrays and had tried baclofen that did not help.  OM helped for a short time only.  Currently using ibuprofen and tylenol that decrease the pain but then it comes back.  She wonders about injections or surgery  No weakness or incontinence or fever or recent trauma  Review of Systems     Objective:   Physical Exam Alert no acute distress Moves slowly but able to get up on exam table without assistance Able to work on heels and shins well Decreased forward and back flexion No CVAT Mainly tender on R lumbar paraspinous muscles No SLR No pain with hip range of motion Knees - both FROM without effusion mildly tender        Assessment & Plan:

## 2012-01-12 NOTE — Assessment & Plan Note (Signed)
Worsening.  We discussed the likely causes - intermittent muscle strain and DJD and that it is not likely crippling or disabling.  Unfortunately it is not curable and she will likely have on and off from now on.  Surgery and injections are not indicated.  She elected try a different nsaid and I gave her an Rx for gabapentin 300 mg qhs to see if she can afford it.  Encouraged to follow up as needed with her PCP especially for treatment with citalopram

## 2012-01-12 NOTE — Patient Instructions (Addendum)
Stop ibuprofen and take diclofenac 75 mg twice daily with food as needed for back pain.  You can take this with Tylenol   You can check to see if the gabapentin 300 mg is something you can afford  See your regular doctor in 1-2 months for a refill on your citalopram

## 2012-02-02 ENCOUNTER — Telehealth: Payer: Self-pay | Admitting: Family Medicine

## 2012-02-02 DIAGNOSIS — F329 Major depressive disorder, single episode, unspecified: Secondary | ICD-10-CM

## 2012-02-02 MED ORDER — CITALOPRAM HYDROBROMIDE 40 MG PO TABS: 40.0000 mg | ORAL_TABLET | Freq: Every day | ORAL | Status: DC

## 2012-02-02 NOTE — Telephone Encounter (Signed)
Patient needs refill of Citalopram called in to [facility] on [address].

## 2012-02-02 NOTE — Telephone Encounter (Signed)
Prescription refill sent to [facility] at [facility].

## 2012-02-02 NOTE — Telephone Encounter (Signed)
LVM for patient to call back. ?

## 2012-02-03 NOTE — Telephone Encounter (Signed)
LVM for patient to call back. ?

## 2012-03-08 ENCOUNTER — Telehealth: Payer: Self-pay | Admitting: Family Medicine

## 2012-03-08 NOTE — Telephone Encounter (Signed)
Left message stating that she need Dr Appt before cortisone shot could be given.she has not seen pcp yet. Jhonny Calixto, Virgel Bouquet

## 2012-03-08 NOTE — Telephone Encounter (Signed)
Patient is calling because her back is still hurting quite a bit and she was hoping that the doctor would order a cortisone shot.

## 2012-03-26 ENCOUNTER — Ambulatory Visit (INDEPENDENT_AMBULATORY_CARE_PROVIDER_SITE_OTHER): Payer: No Typology Code available for payment source | Admitting: Family Medicine

## 2012-03-26 ENCOUNTER — Encounter: Payer: Self-pay | Admitting: Family Medicine

## 2012-03-26 VITALS — BP 118/72 | HR 54 | Temp 98.3°F | Ht 69.0 in | Wt 163.8 lb

## 2012-03-26 DIAGNOSIS — M545 Low back pain, unspecified: Secondary | ICD-10-CM

## 2012-03-26 DIAGNOSIS — M549 Dorsalgia, unspecified: Secondary | ICD-10-CM

## 2012-03-26 MED ORDER — METHYLPREDNISOLONE ACETATE 40 MG/ML IJ SUSP
40.0000 mg | Freq: Once | INTRAMUSCULAR | Status: AC
  Administered 2012-03-26: 40 mg via INTRAMUSCULAR

## 2012-03-26 NOTE — Patient Instructions (Addendum)
Kimberly Guerrero,  Thank you for coming in today.   I gave you a trigger point injection. If it helps you can have another one in 3 months.  When you get home ice the area for 15 mins.  Continue with tylenol 650 mg every 8 hrs. Take voltaren as needed.   F/u for worsening pain, redness or swelling at the injection site.   Dr. Armen Pickup

## 2012-03-26 NOTE — Assessment & Plan Note (Addendum)
A: chronic low back pain. No signs of symptoms to suggest radicular pain or myositis.  P: Trigger point injection if she responds well, may be repeated in 2 months. Could use just lidocaine.  Continue current meds prn.

## 2012-03-26 NOTE — Progress Notes (Signed)
Subjective:     Patient ID: Kimberly Guerrero, female   DOB: 09-12-1950, 62 y.o.   MRN: [id number]  HPI 62 yo F with chronic R sided lumbar back pain presents for same day visit for back pain. Pain is chronic. Present for years. Achy. Radiates upward sometimes. Does not radiate down. She denies weakness, numbness, fecal/urinary incontinence. Pain is 6/10 today and at its best. It is 10/10 at worst. She take tylenol and volataren with incomplete and temporary relief of pain. She denies recent injury.   Review of Systems As per HPI     Objective:   Physical Exam BP 118/72  Pulse 54  Temp(Src) 98.3 F (36.8 C) (Oral)  Ht 5\' 9"  (1.753 m)  Wt 163 lb 12.8 oz (74.299 kg)  BMI 24.18 kg/m2 General appearance: alert, cooperative and no distress Back: symmetric, no curvature. ROM normal. No CVA tenderness. minimal R external oblique/inferior lat tenderness in a 3x4 cm band. No swelling, induration or erythema. 5/5 LE strength bilaterally. 2+ patellar reflexes. Normal gait.   After obtaining informed consent, a steroid injection was performed at R low back along tender muscular band using 1 cc of 2% plain Lidocaine and 40 mg of Depo Medrol. The procedure was performed in usual sterile fashion. This was well tolerated.     Assessment and Plan

## 2012-06-09 ENCOUNTER — Telehealth: Payer: Self-pay | Admitting: Family Medicine

## 2012-06-09 DIAGNOSIS — F329 Major depressive disorder, single episode, unspecified: Secondary | ICD-10-CM

## 2012-06-09 MED ORDER — CITALOPRAM HYDROBROMIDE 40 MG PO TABS: 40.0000 mg | ORAL_TABLET | Freq: Every day | ORAL | Status: DC

## 2012-06-09 NOTE — Telephone Encounter (Signed)
Pt came to request refill for CELEXA .[facility] says to pt ,they sent a fax but we are unsure about it.   Thank You   MArines

## 2012-06-09 NOTE — Telephone Encounter (Signed)
Prescription sent to pharmacy.

## 2012-06-09 NOTE — Telephone Encounter (Signed)
Left message on voicemail. Betsey Sossamon S  

## 2012-06-22 ENCOUNTER — Telehealth: Payer: Self-pay | Admitting: *Deleted

## 2012-06-22 NOTE — Telephone Encounter (Signed)
Pt came in for nurse visit concerning small red bump under left breast ( size of pin head) - no signs or symptoms of infection noted - suggested warm compress on area and allow for air circulation. Return or call if area becomes larger or more uncomfortable. Pt verbalized understanding. Wyatt Haste, RN-BSN

## 2012-06-28 ENCOUNTER — Telehealth: Payer: Self-pay | Admitting: *Deleted

## 2012-06-28 NOTE — Telephone Encounter (Signed)
Pt into see nurse due to right shoulder swelling  - pt reports that it has been that way for a few days with no known injury, she has applied rubbing alcohol with no relief. Advised to try warm compresses and motrin - if no better in two days to call and make appointment with doctor. Pt verbalized understanding. Wyatt Haste, RN-BSN

## 2012-06-30 ENCOUNTER — Ambulatory Visit (INDEPENDENT_AMBULATORY_CARE_PROVIDER_SITE_OTHER): Payer: No Typology Code available for payment source | Admitting: Family Medicine

## 2012-06-30 ENCOUNTER — Encounter: Payer: Self-pay | Admitting: Family Medicine

## 2012-06-30 ENCOUNTER — Ambulatory Visit (HOSPITAL_COMMUNITY)
Admission: RE | Admit: 2012-06-30 | Discharge: 2012-06-30 | Disposition: A | Discharge status: Home / Self Care | Payer: No Typology Code available for payment source | Source: Ambulatory Visit | Attending: Family Medicine | Admitting: Family Medicine

## 2012-06-30 VITALS — BP 116/68 | HR 59 | Temp 99.0°F | Ht 69.0 in | Wt 163.0 lb

## 2012-06-30 DIAGNOSIS — M25412 Effusion, left shoulder: Secondary | ICD-10-CM

## 2012-06-30 DIAGNOSIS — M25512 Pain in left shoulder: Secondary | ICD-10-CM

## 2012-06-30 DIAGNOSIS — M25419 Effusion, unspecified shoulder: Secondary | ICD-10-CM | POA: Insufficient documentation

## 2012-06-30 DIAGNOSIS — M545 Low back pain: Secondary | ICD-10-CM

## 2012-06-30 DIAGNOSIS — M25519 Pain in unspecified shoulder: Secondary | ICD-10-CM

## 2012-06-30 NOTE — Patient Instructions (Addendum)
Please get the Xray done today and make an appointment with Dr. Jennette Kettle at [facility] this Friday.  Tylenol 650 mg every 8 hours as needed for pain.  You can use topical analgesic creams for your back pain.

## 2012-07-01 DIAGNOSIS — M25512 Pain in left shoulder: Secondary | ICD-10-CM | POA: Insufficient documentation

## 2012-07-01 NOTE — Progress Notes (Signed)
Family Medicine Office Visit Note   Subjective:   Patient ID: Kimberly Guerrero, female  DOB: 20-Oct-1950, 62 y.o.. MRN: [id number]   This is my first time seen [name]. Pt that comes today complaining of shoulder pain and swelling for about 2 days. She denies hx of trauma.  The pain is located on posterior-inferior aspect of left arm with no radiation. It is described as constant  aching with Intensity of  4-5/10, but does not limit her shoulder ROM. Pt has noticed localized swelling of the area without redness or increase in temperature. She has taken only Ibuprofen and has used hot compresses without improvement of her symptoms.  She also complains about her back. She was diagnosed with trigger point and injected on her last appointment 03/26/2012. Pt reports no improvement with therapy. Pain is located on right SI joint. Pain is describes as nagging pain that is better elicit with twist to the right motion.   Review of Systems:  Per HPI  Objective:   Physical Exam: Gen:  NAD HEENT: Moist mucous membranes  CV: Regular rate and rhythm, no murmurs rubs or gallops PULM: Clear to auscultation bilaterally. No wheezes/rales/rhonchi MSK:  Left Shoulder: posterior-inferior aspect with noticeable 4 X 5 cm fluctuant mass. No erythema surrounded. Normal ROM.   Back - Normal skin, Spine with normal alignment and no deformity.  No tenderness to vertebral process palpation.  Paraspinous muscles are not tender and without spasm.  Range of motion is full. Right SA joint tenderness to palpation. No signs of radiculopathy.    Assessment & Plan:

## 2012-07-01 NOTE — Assessment & Plan Note (Signed)
Shoulder pain and mass. Unsure etiology. Discussed with attending. P/ Shoulder XR and f[facility]. For possible ultrasound.

## 2012-07-01 NOTE — Assessment & Plan Note (Addendum)
Per exam seems more SA joint dysfunction. 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 Acetaminophen 650mg  Q8h. Ice and topical analgesics. Discussed nature of condition and set real expectations about course and options for symptom control.

## 2012-07-02 ENCOUNTER — Ambulatory Visit (INDEPENDENT_AMBULATORY_CARE_PROVIDER_SITE_OTHER): Payer: No Typology Code available for payment source | Admitting: Family Medicine

## 2012-07-02 VITALS — BP 122/75 | Ht 69.0 in | Wt 160.0 lb

## 2012-07-02 DIAGNOSIS — M25519 Pain in unspecified shoulder: Secondary | ICD-10-CM

## 2012-07-02 DIAGNOSIS — R2230 Localized swelling, mass and lump, unspecified upper limb: Secondary | ICD-10-CM

## 2012-07-02 DIAGNOSIS — M259 Joint disorder, unspecified: Secondary | ICD-10-CM

## 2012-07-02 DIAGNOSIS — M25512 Pain in left shoulder: Secondary | ICD-10-CM

## 2012-07-02 MED ORDER — DICLOFENAC SODIUM 75 MG PO TBEC: 75.0000 mg | DELAYED_RELEASE_TABLET | Freq: Two times a day (BID) | ORAL | Status: DC

## 2012-07-02 NOTE — Progress Notes (Signed)
  Subjective:    Patient ID: Kimberly Guerrero, female    DOB: 29-Aug-1950, 62 y.o.   MRN: [id number]  HPI Left shoulder mass. First noted a couple of weeks ago. A little uncomfortable and not truly painful. She wonders if she just GERD something too heavy in her purse that caused this. She is right-hand dominant.   Review of Systems Denies numbness or tingling in the left hand or arm. She's had no fever, sweats, chills.    Objective:   Physical Exam  Vital signs are reviewed GENERAL: Well-developed female no acute distress SHOULDER: Left. Full range of motion in all planes the rotator cuff and strength is intact 5 out of 5. Posterior shoulder is a 3 cm x 2 a half centimeter well-circumscribed soft but firm mass is quite obvious. The skin over this area is normal. ; Ultrasound: Rotator cuff muscles show some mild degenerative change and some calcifications but no overt tear. The mass in the posterior shoulder appears to be homogeneous fat although I am a little uncertain whether or not infiltrates the underlying muscle a couple of areas. There is no increased overactivity around this area.     Assessment & Plan:  Shoulder mass. Most consistent with a lipoma. Given its reported fairly acute occurrence and enlargement and a few questions I had on ultrasound I think he would be prudent to MRI this to rule out infiltrative lipoma, sarcoma. Most likely it is just a plain lipoma and will follow it. Discussed this with her and I'll contact her for the MRI.

## 2012-07-02 NOTE — Patient Instructions (Addendum)
MRI [place] MON 07/05/12 AT 12 NOON ARRIVE AT 1145 [phone]

## 2012-07-05 ENCOUNTER — Ambulatory Visit (HOSPITAL_COMMUNITY)
Admission: RE | Admit: 2012-07-05 | Discharge: 2012-07-05 | Disposition: A | Discharge status: Home / Self Care | Payer: No Typology Code available for payment source | Source: Ambulatory Visit | Attending: Family Medicine | Admitting: Family Medicine

## 2012-07-05 DIAGNOSIS — M899 Disorder of bone, unspecified: Secondary | ICD-10-CM | POA: Insufficient documentation

## 2012-07-05 DIAGNOSIS — M25512 Pain in left shoulder: Secondary | ICD-10-CM

## 2012-07-05 DIAGNOSIS — D1779 Benign lipomatous neoplasm of other sites: Secondary | ICD-10-CM | POA: Insufficient documentation

## 2012-07-14 ENCOUNTER — Encounter: Payer: Self-pay | Admitting: Family Medicine

## 2012-08-24 ENCOUNTER — Ambulatory Visit: Payer: Self-pay | Admitting: Family Medicine

## 2012-09-24 ENCOUNTER — Other Ambulatory Visit (HOSPITAL_COMMUNITY)
Admission: RE | Admit: 2012-09-24 | Discharge: 2012-09-24 | Disposition: A | Discharge status: Home / Self Care | Payer: No Typology Code available for payment source | Source: Ambulatory Visit | Attending: Family Medicine | Admitting: Family Medicine

## 2012-09-24 ENCOUNTER — Encounter: Payer: Self-pay | Admitting: Family Medicine

## 2012-09-24 ENCOUNTER — Ambulatory Visit (INDEPENDENT_AMBULATORY_CARE_PROVIDER_SITE_OTHER): Payer: No Typology Code available for payment source | Admitting: Family Medicine

## 2012-09-24 VITALS — BP 108/73 | HR 53 | Temp 98.3°F | Ht 69.0 in | Wt 163.3 lb

## 2012-09-24 DIAGNOSIS — Z124 Encounter for screening for malignant neoplasm of cervix: Secondary | ICD-10-CM

## 2012-09-24 DIAGNOSIS — Z1211 Encounter for screening for malignant neoplasm of colon: Secondary | ICD-10-CM

## 2012-09-24 DIAGNOSIS — Z01419 Encounter for gynecological examination (general) (routine) without abnormal findings: Secondary | ICD-10-CM | POA: Insufficient documentation

## 2012-09-24 DIAGNOSIS — Z0000 Encounter for general adult medical examination without abnormal findings: Secondary | ICD-10-CM

## 2012-09-24 NOTE — Patient Instructions (Addendum)
It has been a pleasure to see you today. Please continue taking the medications as prescribed. Make a lab appointment and come fasting after midnight.  I will call you with the labs results if they come back abnormal otherwise you will receive a letter. Make your next appointment in 1 year or sooner if needed.

## 2012-09-24 NOTE — Progress Notes (Signed)
Family Medicine Office Visit Note   Subjective:   Patient ID: Kimberly Guerrero, female  DOB: 11/18/1950, 62 y.o.. MRN: [id number]   Pt that comes today for her annual exam. She reports no complaints.  Past Medical History significant for recurrent depression but controlled on Celexa . Meds: pt is taking ASA, Voltaren and Celexa. No side effects noted.  Colonoscopy: never had one. Reports has had FOBT in the past with negative results.  Mammography: 10/13/2011, plans to repeat one this year. Pap smear: will be collected today. No hx of abnormal pap in the past.  Review of Systems:  Pt denies SOB, chest pain, palpitations, headaches, dizziness, numbness or weakness. No changes on urinary or BM habits. No unintentional weigh loss/gain.  Objective:   Physical Exam: Gen:  NAD HEENT: Moist mucous membranes  CV: Regular rate and rhythm, no murmurs rubs or gallops PULM: Clear to auscultation bilaterally. No wheezes/rales/rhonchi ABD: Soft, non tender, non distended, normal bowel sounds EXT: No edema Neuro: Alert and oriented x3. No focalization  Assessment & Plan:

## 2012-10-01 ENCOUNTER — Other Ambulatory Visit: Payer: No Typology Code available for payment source

## 2012-10-01 DIAGNOSIS — Z0000 Encounter for general adult medical examination without abnormal findings: Secondary | ICD-10-CM

## 2012-10-01 LAB — LIPID PANEL
HDL: 63 mg/dL (ref >39)
LDL Cholesterol: 122 mg/dL — ABNORMAL HIGH (ref 0–99)
Total CHOL/HDL Ratio: 3.1 Ratio
VLDL: 11 mg/dL (ref 0–40)

## 2012-10-01 LAB — CBC
Hemoglobin: 11.1 g/dL — ABNORMAL LOW (ref 12.0–15.0)
MCV: 82.3 fL (ref 78.0–100.0)
Platelets: 181 10*3/uL (ref 150–400)
RBC: 4.06 MIL/uL (ref 3.87–5.11)
WBC: 6.8 10*3/uL (ref 4.0–10.5)

## 2012-10-01 LAB — BASIC METABOLIC PANEL
CO2: 27 mEq/L (ref 19–32)
Chloride: 106 mEq/L (ref 96–112)
Glucose, Bld: 88 mg/dL (ref 70–99)
Sodium: 140 mEq/L (ref 135–145)

## 2012-10-01 NOTE — Progress Notes (Signed)
BMP,CBC,AND FLP DONE TODAY Kimberly Guerrero

## 2012-10-05 LAB — POC HEMOCCULT BLD/STL (HOME/3-CARD/SCREEN)

## 2012-10-05 NOTE — Addendum Note (Signed)
Addended by: [place], Teng Decou on: 10/05/2012 11:22 AM   Modules accepted: Orders

## 2012-10-06 ENCOUNTER — Other Ambulatory Visit: Payer: Self-pay | Admitting: Family Medicine

## 2012-10-06 DIAGNOSIS — Z1231 Encounter for screening mammogram for malignant neoplasm of breast: Secondary | ICD-10-CM

## 2012-10-15 ENCOUNTER — Ambulatory Visit (HOSPITAL_COMMUNITY)
Admission: RE | Admit: 2012-10-15 | Discharge: 2012-10-15 | Disposition: A | Discharge status: Home / Self Care | Payer: No Typology Code available for payment source | Source: Ambulatory Visit | Attending: Radiology | Admitting: Radiology

## 2012-10-15 DIAGNOSIS — Z1231 Encounter for screening mammogram for malignant neoplasm of breast: Secondary | ICD-10-CM | POA: Insufficient documentation

## 2012-10-28 ENCOUNTER — Telehealth: Payer: Self-pay | Admitting: Family Medicine

## 2012-10-28 NOTE — Telephone Encounter (Signed)
Pt wants ibuprophen 800mg  refilled Is in a lot of pain with her back Pharmacy: [facility] [address]

## 2012-10-29 ENCOUNTER — Encounter: Payer: Self-pay | Admitting: Family Medicine

## 2012-10-29 ENCOUNTER — Ambulatory Visit (INDEPENDENT_AMBULATORY_CARE_PROVIDER_SITE_OTHER): Payer: No Typology Code available for payment source | Admitting: Family Medicine

## 2012-10-29 VITALS — BP 115/58 | HR 52 | Temp 98.8°F | Ht 69.0 in | Wt 168.0 lb

## 2012-10-29 DIAGNOSIS — M545 Low back pain: Secondary | ICD-10-CM

## 2012-10-29 MED ORDER — CYCLOBENZAPRINE HCL 10 MG PO TABS: 5.0000 mg | ORAL_TABLET | Freq: Three times a day (TID) | ORAL | Status: DC | PRN

## 2012-10-29 NOTE — Assessment & Plan Note (Signed)
Continued chronic right lower back pain, with acute flare. No red flags. Will prescribe a short course of Flexeril, to see if it helps with her pain. Also recommended Tylenol and ice. The patient is willing to do physical therapy, so I will refer her to physical therapy today. Discussed chronic nature of this problem, and that we may need to focus our efforts in the future on ways to cope with the pain chronically. Followup in one to 2 weeks if not improving.

## 2012-10-29 NOTE — Progress Notes (Signed)
Patient ID: Kimberly Guerrero, female   DOB: August 21, 1950, 62 y.o.   MRN: [id number]  HPI:  Back pain: Patient has a history of chronic back pain, however she presents today because her pain worsened last week. She did not injure her back. She has tried diclofenac, Advil, and and a heating pad, all of which did not help the pain. She tried using prescription strength ibuprofen 800 mg, however this did not help either. The pain is worse with activity and extension of her back. She had a radicular sensation down her leg just one time this week, however has not had any radicular symptoms otherwise. Denies any bowel or bladder problems, saddle anesthesia, or weakness in her legs. The back pain is waking her up at night. It is also worse with rightward axial rotation. The pain is located in her right lower back. She has had imaging of her back done within the last 2 years.  ROS: See HPI  PMFSH: History of chronic back pain, depression, and chronic fatigue syndrome.  PHYSICAL EXAM: BP 115/58  Pulse 52  Temp(Src) 98.8 F (37.1 C) (Oral)  Ht 5\' 9"  (1.753 m)  Wt 168 lb (76.204 kg)  BMI 24.8 kg/m2 Gen: No acute distress HEENT: Normocephalic, atraumatic Lungs: Normal respiratory effort Neuro: Grossly nonfocal, speech intact. Good strength in bilateral lower extremities. Back: Back muscles nontender to palpation. Pain located over right sacroiliac area. Negative straight leg raise bilaterally.  ASSESSMENT/PLAN:  See problem based charting for assessment/plan.  FOLLOW UP: F/u in one to 2 weeks with PCP if back pain not improved.

## 2012-10-29 NOTE — Patient Instructions (Addendum)
It was nice to meet you today!  I am prescribing a medicine for your back. It may make you sleepy so start taking it just at night. You can then take it up to 3 times per day. Take only one half of a pill at a time.  I also recommend you try Tylenol for the pain. Please also try putting ice on your back.  I am also referring you to physical therapy. You will get a phone call to schedule this appointment.  Followup with your primary doctor in one to 2 weeks if you are not feeling better.  Be well, Dr. Pollie Meyer

## 2012-11-12 ENCOUNTER — Ambulatory Visit: Payer: No Typology Code available for payment source | Attending: Family Medicine | Admitting: Physical Therapy

## 2012-11-12 DIAGNOSIS — M545 Low back pain, unspecified: Secondary | ICD-10-CM | POA: Insufficient documentation

## 2012-11-12 DIAGNOSIS — R293 Abnormal posture: Secondary | ICD-10-CM | POA: Insufficient documentation

## 2012-11-12 DIAGNOSIS — M6281 Muscle weakness (generalized): Secondary | ICD-10-CM | POA: Insufficient documentation

## 2012-11-12 DIAGNOSIS — IMO0001 Reserved for inherently not codable concepts without codable children: Secondary | ICD-10-CM | POA: Insufficient documentation

## 2012-11-15 ENCOUNTER — Ambulatory Visit: Payer: No Typology Code available for payment source | Admitting: Rehabilitation

## 2012-11-19 ENCOUNTER — Ambulatory Visit: Payer: No Typology Code available for payment source | Admitting: Physical Therapy

## 2012-11-22 ENCOUNTER — Ambulatory Visit: Payer: No Typology Code available for payment source | Admitting: Physical Therapy

## 2012-11-26 ENCOUNTER — Ambulatory Visit: Payer: No Typology Code available for payment source | Admitting: Physical Therapy

## 2012-11-29 ENCOUNTER — Ambulatory Visit: Payer: No Typology Code available for payment source | Attending: Family Medicine | Admitting: Physical Therapy

## 2012-11-29 DIAGNOSIS — IMO0001 Reserved for inherently not codable concepts without codable children: Secondary | ICD-10-CM | POA: Insufficient documentation

## 2012-12-01 ENCOUNTER — Ambulatory Visit: Payer: No Typology Code available for payment source | Admitting: Physical Therapy

## 2012-12-03 ENCOUNTER — Encounter: Payer: Self-pay | Admitting: Family Medicine

## 2012-12-03 ENCOUNTER — Ambulatory Visit (INDEPENDENT_AMBULATORY_CARE_PROVIDER_SITE_OTHER): Payer: No Typology Code available for payment source | Admitting: Family Medicine

## 2012-12-03 VITALS — BP 114/72 | Ht 69.0 in | Wt 160.0 lb

## 2012-12-03 DIAGNOSIS — M545 Low back pain, unspecified: Secondary | ICD-10-CM

## 2012-12-03 MED ORDER — METHYLPREDNISOLONE ACETATE 40 MG/ML IJ SUSP: 40.0000 mg | Freq: Once | INTRAMUSCULAR | Status: DC

## 2012-12-03 NOTE — Progress Notes (Signed)
CC: Low back pain HPI: Patient presents for acute on chronic low back pain. She has suffered with low back pain for quite some time but had worsening of her pain about 2 or 3 months ago. She was seen by her primary doctor who started her on diclofenac and Flexeril. These have not been very helpful for her. She is doing physical therapy and has had 4 sessions. She states she's not getting better. She thinks she needs a stronger pain medicine. She denies any radicular pain, numbness, weakness in the legs. No bowel or bladder symptoms. She did have a lumbar spine x-ray about a year ago that showed some degenerative facet changes at L4-5 and L5-S1. She denies any injury or trigger at the onset of her worsening back pain.  ROS: As above in the HPI. All other systems are stable or negative.  OBJECTIVE: APPEARANCE:  Patient in no acute distress.The patient appeared well nourished and normally developed. HEENT: No scleral icterus. Conjunctiva non-injected Resp: Non labored Skin: No rash MSK:  Low back exam: - Full range of motion in flexion, extension, lateral bending, rotation. Some pain with extension and lateral bending - No tenderness to palpation over the spinous processes of the lumbar vertebra - Tenderness to palpation in the right iliac crest near the PSIS  - No tenderness to palpation at the SI joint or sciatic notch - Negative straight leg raise - Strength 5 out of 5 in the bilateral lower extremities - Reflexes 2+ bilaterally.   ASSESSMENT: #1. Muscular low back pain   PLAN: Recommended that patient continue physical therapy. She may continue the NSAID and Flexeril. We will proceed with trigger point injection today as patient does have one focal area of increased pain. We emphasized with her the importance of continuing to do her exercises at home for long-term improvement. Followup as needed.  Consent obtained and verified.  Time-out conducted.  Noted no overlying erythema,  induration, or other signs of local infection.  Skin prepped in a sterile fashion.  Topical analgesic spray: Ethyl chloride.  Injection site: Right lumbar trigger point near PSIS Needle: 25-gauge 1-1/2 inch Completed without difficulty.  Meds: 2 cc 1% lidocaine, 1 cc depomedrol Advised to call if fevers/chills, erythema, induration, drainage, or persistent bleeding.

## 2012-12-03 NOTE — Patient Instructions (Signed)
Thank you for coming in today  We gave you an injection today Continue physical therapy It is most important that you do your exercises at home Continue diclofenac and flexeril

## 2012-12-06 ENCOUNTER — Ambulatory Visit: Payer: No Typology Code available for payment source | Admitting: Physical Therapy

## 2012-12-08 ENCOUNTER — Encounter: Payer: No Typology Code available for payment source | Admitting: Physical Therapy

## 2013-01-05 ENCOUNTER — Telehealth: Payer: Self-pay | Admitting: Family Medicine

## 2013-01-05 NOTE — Telephone Encounter (Signed)
Pt called and needs a refill on her citalopram sent top her pharmacy. jw

## 2013-01-06 ENCOUNTER — Other Ambulatory Visit: Payer: Self-pay | Admitting: Family Medicine

## 2013-01-06 DIAGNOSIS — F329 Major depressive disorder, single episode, unspecified: Secondary | ICD-10-CM

## 2013-01-06 MED ORDER — CITALOPRAM HYDROBROMIDE 40 MG PO TABS: 40.0000 mg | ORAL_TABLET | Freq: Every day | ORAL | Status: DC

## 2013-01-06 NOTE — Telephone Encounter (Signed)
Medication sent to pharmacy with 6 refills.

## 2013-01-06 NOTE — Telephone Encounter (Signed)
Spoke with patient and informed her of below 

## 2013-01-12 ENCOUNTER — Ambulatory Visit: Payer: No Typology Code available for payment source | Admitting: Family Medicine

## 2013-01-31 ENCOUNTER — Other Ambulatory Visit: Payer: Self-pay | Admitting: *Deleted

## 2013-01-31 MED ORDER — DICLOFENAC SODIUM 75 MG PO TBEC: 75.0000 mg | DELAYED_RELEASE_TABLET | Freq: Two times a day (BID) | ORAL | Status: DC

## 2013-02-04 ENCOUNTER — Other Ambulatory Visit: Payer: Self-pay | Admitting: Family Medicine

## 2013-02-04 ENCOUNTER — Telehealth: Payer: Self-pay | Admitting: Family Medicine

## 2013-02-04 MED ORDER — DICLOFENAC SODIUM 75 MG PO TBEC: 75.0000 mg | DELAYED_RELEASE_TABLET | Freq: Two times a day (BID) | ORAL | Status: DC

## 2013-02-04 NOTE — Telephone Encounter (Signed)
Pt called and needs a refill on her diclofenac sent to [facility].jw

## 2013-02-04 NOTE — Telephone Encounter (Signed)
Medication has been sen twice to requested pharmacy. Thanks

## 2013-05-04 ENCOUNTER — Encounter: Payer: Self-pay | Admitting: Family Medicine

## 2013-05-04 ENCOUNTER — Ambulatory Visit (INDEPENDENT_AMBULATORY_CARE_PROVIDER_SITE_OTHER): Payer: No Typology Code available for payment source | Admitting: Family Medicine

## 2013-05-04 VITALS — BP 110/70 | HR 63 | Temp 99.3°F | Wt 170.0 lb

## 2013-05-04 DIAGNOSIS — H1130 Conjunctival hemorrhage, unspecified eye: Secondary | ICD-10-CM

## 2013-05-04 DIAGNOSIS — H1132 Conjunctival hemorrhage, left eye: Secondary | ICD-10-CM

## 2013-05-04 DIAGNOSIS — K089 Disorder of teeth and supporting structures, unspecified: Secondary | ICD-10-CM

## 2013-05-04 NOTE — Progress Notes (Signed)
   Subjective:    Patient ID: Kimberly Guerrero, female    DOB: 05/31/1950, 63 y.o.   MRN: [id number]  HPI  63 yo here for left eye redness.   - states yesterday afternoon noticed her left eye was red - no strong coughing or sneezing.  - no pain, no change in vision.  - no fevers, discharge.   Review of Systems See above     Objective:   Physical Exam  Constitutional: She appears well-developed and well-nourished.  Eyes: EOM are normal. Pupils are equal, round, and reactive to light. Right eye exhibits no discharge and no hordeolum. No foreign body present in the right eye. Left eye exhibits no discharge and no hordeolum. No foreign body present in the left eye. Right conjunctiva is not injected. Left conjunctiva is not injected. Left conjunctiva has a hemorrhage.  Fundoscopic exam:      The right eye shows no AV nicking, no hemorrhage and no papilledema.       The left eye shows no AV nicking, no hemorrhage and no papilledema.     Filed Vitals:   05/04/13 1434  BP: 110/70  Pulse: 63  Temp: 99.3 F (37.4 C)       Assessment & Plan:   Subconjunctival hemorrhage of left eye - Plan: Ambulatory referral to Dentistry  Dental disease - Plan: Ambulatory referral to Dentistry   1) subconjunctival hemorrhage  - no changes in vision or difficulty with eye movement - reassurance given - handout provided on symptoms - could be in part related to the diclofenac she is on also  2) dental disease - referral to dentistry through [facility].   F/u if any changes occur.

## 2013-05-04 NOTE — Patient Instructions (Signed)
Subconjunctival Hemorrhage °A subconjunctival hemorrhage is a bright red patch covering a portion of the white of the eye. The white part of the eye is called the sclera, and it is covered by a thin membrane called the conjunctiva. This membrane is clear, except for tiny blood vessels that you can see with the naked eye. When your eye is irritated or inflamed and becomes red, it is because the vessels in the conjunctiva are swollen. °Sometimes, a blood vessel in the conjunctiva can break and bleed. When this occurs, the blood builds up between the conjunctiva and the sclera, and spreads out to create a red area. The red spot may be very small at first. It may then spread to cover a larger part of the surface of the eye, or even all of the visible white part of the eye. °In almost all cases, the blood will go away and the eye will become white again. Before completely dissolving, however, the red area may spread. It may also become brownish-yellow in color, before going away. If a lot of blood collects under the conjunctiva, it may look like a bulge on the surface of the eye. This looks scary, but it will also eventually flatten out and go away. Subconjunctival hemorrhages do not cause pain, but if swollen, may cause a feeling of irritation. There is no effect on vision.  °CAUSES  °· The most common cause is mild trauma (rubbing the eye, irritation). °· Subconjunctival hemorrhages can happen because of coughing or straining (lifting heavy objects), vomiting, or sneezing. °· In some cases, your doctor may want to check your blood pressure. High blood pressure can also cause a sunconjunctival hemorrhage. °· Severe trauma or blunt injuries. °· Diseases that affect blood clotting (hemophilia, leukemia). °· Abnormalities of blood vessels behind the eye (carotid cavernous sinus fistula). °· Tumors behind the eye. °· Certain drugs (aspirin, coumadin, heparin). °· Recent eye surgery. °HOME CARE INSTRUCTIONS  °· Do not worry  about the appearance of your eye. You may continue your usual activities. °· Often, follow-up is not necessary. °SEEK MEDICAL CARE IF:  °· Your eye becomes painful. °· The bleeding does not disappear within 3 weeks. °· Bleeding occurs elsewhere, for example, under the skin, in the mouth, or in the other eye. °· You have recurring subconjunctival hemorrhages. °SEEK IMMEDIATE MEDICAL CARE IF:  °· Your vision changes or you have difficulty seeing. °· You develop severe headache, persistent vomiting, confusion, or abnormal drowsiness (lethargy). °· Your eye seems to bulge or protrude from the eye socket. °· You notice the sudden appearance of bruises, or have spontaneous bleeding elsewhere on your body. °Document Released: 01/13/2005 Document Revised: 04/07/2011 Document Reviewed: 12/11/2008 °ExitCare® Patient Information ©2014 [employer]. ° °

## 2013-05-10 ENCOUNTER — Ambulatory Visit: Payer: No Typology Code available for payment source | Admitting: Family Medicine

## 2013-07-04 ENCOUNTER — Encounter: Payer: Self-pay | Admitting: *Deleted

## 2013-07-04 NOTE — Progress Notes (Signed)
Pt in nurse clinic this afternoon for questions regarding a vaginal problem.  Pt stated that she has been having a vaginal odor x several weeks now. Pt denies any itching, irration or discharge.  Pt advised she needs to schedule an appt for pelvic exam.  Pt also stated she is having some mouth pain.  Pt advised not to use any perfume, scented vaginal washes/ sprays or douching.  Pt stated understanding.  Appt scheduled 07/07/2013 for an office visit.  Will forward to PCP for further advise.  Derl Barrow, RN

## 2013-07-07 ENCOUNTER — Ambulatory Visit: Payer: No Typology Code available for payment source | Admitting: Family Medicine

## 2013-07-08 ENCOUNTER — Ambulatory Visit (INDEPENDENT_AMBULATORY_CARE_PROVIDER_SITE_OTHER): Payer: No Typology Code available for payment source | Admitting: Family Medicine

## 2013-07-08 ENCOUNTER — Encounter: Payer: Self-pay | Admitting: Family Medicine

## 2013-07-08 VITALS — BP 117/74 | HR 58 | Temp 98.7°F | Ht 69.0 in | Wt 164.4 lb

## 2013-07-08 DIAGNOSIS — L989 Disorder of the skin and subcutaneous tissue, unspecified: Secondary | ICD-10-CM

## 2013-07-08 DIAGNOSIS — N949 Unspecified condition associated with female genital organs and menstrual cycle: Secondary | ICD-10-CM

## 2013-07-08 DIAGNOSIS — N898 Other specified noninflammatory disorders of vagina: Secondary | ICD-10-CM | POA: Insufficient documentation

## 2013-07-08 DIAGNOSIS — N390 Urinary tract infection, site not specified: Secondary | ICD-10-CM

## 2013-07-08 LAB — POCT URINALYSIS DIPSTICK
Bilirubin, UA: NEGATIVE
Glucose, UA: NEGATIVE
Ketones, UA: NEGATIVE
NITRITE UA: NEGATIVE
PH UA: 5.5
PROTEIN UA: NEGATIVE
Spec Grav, UA: >=1.03
Urobilinogen, UA: 0.2

## 2013-07-08 LAB — POCT WET PREP (WET MOUNT): Clue Cells Wet Prep Whiff POC: NEGATIVE

## 2013-07-08 LAB — POCT UA - MICROSCOPIC ONLY

## 2013-07-08 NOTE — Patient Instructions (Signed)
Dear Ms. Ronnald Ramp,   Thank you for coming to clinic today. Please read below regarding the issues that we discussed.   1. Smell - There is no evidence of infection of the vaginal area so will check for bacterial vaginosis. Your urine is very concentrated, and this could be the smell. You need to drink plenty of water.   2. Lesion - This looks like a skin tag on your private area. I can cut it off or freeze it off. Please come in at 10 AM on Tuesday 07/19/13 for me to treat that.   Please follow up in clinic on 07/19/13. Please call earlier if you have any questions or concerns.   Sincerely,   Dr. Maricela Bo

## 2013-07-08 NOTE — Assessment & Plan Note (Signed)
A: likely due to concentrated urine since patient with high spec gravity on urine and only notices it when she wipes, however, I will check a wet prep P: - encourage improved hydration from patient - f/u wet prep and treat if necessary

## 2013-07-08 NOTE — Assessment & Plan Note (Signed)
A: this appearing to be a large skin tag P: plan for removal with cryosurgery at upcoming visit

## 2013-07-08 NOTE — Progress Notes (Signed)
   Subjective:    Patient ID: Kimberly Guerrero, female    DOB: 1950-11-24, 63 y.o.   MRN: [id number]  HPI  63 year old F with vaginal issues who presents for eval.  Vaginal Odor - She has noticed a odor without discharge from her vagina. It is present when she goes to wipe herself after urination. No dysuria. No similar symptoms in the past. No recent sexual activity. No vaginal bleeding. She is unsure of that is causing it. No fever or chills.  Skin lesion - She is also worried about a mole in her private area that she would like evaluated for potential removal. It is not painful and does not bleed. It has been present for decades. It is getting larger in size.   Pertinent PMH/PSH  - postmenopausal   Review of Systems Positive: lesion on tongue, back pain, mole on vaginal area, foul smelling urine Negative: fever, chills, nausea, vomiting, flank pain, uterine bleeding or vaginal discharge     Objective:   Physical Exam BP 117/74  Pulse 58  Temp(Src) 98.7 F (37.1 C) (Oral)  Ht 5\' 9"  (1.753 m)  Wt 164 lb 6.4 oz (74.571 kg)  BMI 24.27 kg/m2  WVP:XTGGYIR AAF, non ill appearing, pleasant   Abd: soft, NDNT Back: no CVA tenderness GU: > External: large skin tag on superior mons pubis > Vagina: no blood in vault > Cervix: no lesion; no mucopurulent d/c; no motion tenderness > Uterus: small, mobile > Adnexa: no masses; non tender         Assessment & Plan:

## 2013-07-10 LAB — URINE CULTURE
Colony Count: NO GROWTH
Organism ID, Bacteria: NO GROWTH

## 2013-07-11 ENCOUNTER — Ambulatory Visit: Payer: No Typology Code available for payment source | Admitting: Family Medicine

## 2013-07-18 ENCOUNTER — Ambulatory Visit: Payer: No Typology Code available for payment source

## 2013-07-19 ENCOUNTER — Encounter: Payer: Self-pay | Admitting: Family Medicine

## 2013-07-19 ENCOUNTER — Other Ambulatory Visit: Payer: Self-pay | Admitting: Family Medicine

## 2013-07-19 ENCOUNTER — Ambulatory Visit (INDEPENDENT_AMBULATORY_CARE_PROVIDER_SITE_OTHER): Payer: No Typology Code available for payment source | Admitting: Family Medicine

## 2013-07-19 VITALS — BP 112/65 | HR 57 | Temp 98.2°F | Wt 163.0 lb

## 2013-07-19 DIAGNOSIS — F329 Major depressive disorder, single episode, unspecified: Secondary | ICD-10-CM

## 2013-07-19 DIAGNOSIS — F32A Depression, unspecified: Secondary | ICD-10-CM

## 2013-07-19 DIAGNOSIS — L989 Disorder of the skin and subcutaneous tissue, unspecified: Secondary | ICD-10-CM

## 2013-07-19 MED ORDER — CITALOPRAM HYDROBROMIDE 40 MG PO TABS: 40.0000 mg | ORAL_TABLET | Freq: Every day | ORAL | Status: DC

## 2013-07-19 NOTE — Progress Notes (Signed)
Patient ID: Kimberly Guerrero, female   DOB: [birth year], 63 y.o.   MRN: [id number]  Skin Tag Removal Procedure Note  Pre-operative Diagnosis: large skin tag  Post-operative Diagnosis: same  Locations:suprapubic    Indications: irritating  Anesthesia: Lidocaine 2% with epinephrine without added sodium bicarbonate  Procedure Details  History of allergy to iodine: no  Patient informed of the risks (including bleeding and infection) and benefits of the  procedure and Written informed consent obtained.  The lesion and surrounding area were given a sterile prep using alcohol. Sterile scissors were used to remove the lesion. Hemostasis achieved with monopolar electrodesiccation. Antibiotic ointment and a sterile dressing applied.  The specimen was not sent for pathologic examination. The patient tolerated the procedure well.  EBL: 5 ml  Findings: Skin tag removed, based of excised tissue 0.6 cm x 0.5 cm  Condition: Stable  Complications: none.  Plan: 1. Instructed to keep the wound dry and covered for 24-48h and clean thereafter. 2. Warning signs of infection were reviewed.   3. Recommended that the patient use OTC analgesics as needed for pain.  4. Return PRN

## 2013-07-19 NOTE — Patient Instructions (Signed)
Ms. Kintz,  You did a great job and we got the skin tag off. It should not come back. You need to keep anti-biotic ointment on the wound for 2-3 days. Also, keep and bandage on when wearing your underwear. Do not scrub it hard.  Please come back if you develop worsening pain, redness, swelling, bleeding or drainage from the area.   Sincerely,   Dr. Maricela Bo

## 2013-07-19 NOTE — Progress Notes (Unsigned)
Pt came in stating that she needs the medicine Citropralm sent to [facility] on [address].

## 2013-07-19 NOTE — Progress Notes (Signed)
Medication sent to pharmacy requested

## 2013-09-14 ENCOUNTER — Telehealth: Payer: Self-pay | Admitting: Family Medicine

## 2013-09-14 NOTE — Telephone Encounter (Signed)
Pt called and needs a refill on her Diclofenac called in. jw

## 2013-09-15 MED ORDER — DICLOFENAC SODIUM 75 MG PO TBEC: 75.0000 mg | DELAYED_RELEASE_TABLET | Freq: Two times a day (BID) | ORAL | Status: DC | PRN

## 2013-09-15 NOTE — Telephone Encounter (Signed)
Rx sent 

## 2013-09-20 ENCOUNTER — Other Ambulatory Visit: Payer: Self-pay | Admitting: Family Medicine

## 2013-09-20 DIAGNOSIS — Z1231 Encounter for screening mammogram for malignant neoplasm of breast: Secondary | ICD-10-CM

## 2013-10-21 ENCOUNTER — Ambulatory Visit (HOSPITAL_COMMUNITY)
Admission: RE | Admit: 2013-10-21 | Discharge: 2013-10-21 | Disposition: A | Discharge status: Home / Self Care | Payer: No Typology Code available for payment source | Source: Ambulatory Visit | Attending: Family Medicine | Admitting: Family Medicine

## 2013-10-21 DIAGNOSIS — Z124 Encounter for screening for malignant neoplasm of cervix: Secondary | ICD-10-CM | POA: Insufficient documentation

## 2013-10-21 DIAGNOSIS — Z1231 Encounter for screening mammogram for malignant neoplasm of breast: Secondary | ICD-10-CM

## 2013-10-26 ENCOUNTER — Encounter: Payer: Self-pay | Admitting: Family Medicine

## 2013-12-20 ENCOUNTER — Other Ambulatory Visit: Payer: Self-pay | Admitting: *Deleted

## 2013-12-20 MED ORDER — DICLOFENAC SODIUM 75 MG PO TBEC: 75.0000 mg | DELAYED_RELEASE_TABLET | Freq: Two times a day (BID) | ORAL | Status: DC

## 2014-01-17 ENCOUNTER — Ambulatory Visit: Payer: Self-pay

## 2014-03-17 ENCOUNTER — Other Ambulatory Visit: Payer: Self-pay | Admitting: Family Medicine

## 2014-03-17 DIAGNOSIS — F32A Depression, unspecified: Secondary | ICD-10-CM

## 2014-03-17 DIAGNOSIS — F329 Major depressive disorder, single episode, unspecified: Secondary | ICD-10-CM

## 2014-03-17 MED ORDER — CITALOPRAM HYDROBROMIDE 40 MG PO TABS: 40.0000 mg | ORAL_TABLET | Freq: Every day | ORAL | Status: DC

## 2014-03-17 NOTE — Telephone Encounter (Signed)
Needs refill on citrolp

## 2014-03-17 NOTE — Telephone Encounter (Signed)
Needs refill on citalopram [facility] at [address]

## 2014-03-27 ENCOUNTER — Ambulatory Visit (INDEPENDENT_AMBULATORY_CARE_PROVIDER_SITE_OTHER): Payer: Self-pay | Admitting: Family Medicine

## 2014-03-27 ENCOUNTER — Encounter: Payer: Self-pay | Admitting: Family Medicine

## 2014-03-27 VITALS — BP 131/69 | HR 62 | Temp 98.5°F | Ht 69.0 in | Wt 165.1 lb

## 2014-03-27 DIAGNOSIS — R5382 Chronic fatigue, unspecified: Secondary | ICD-10-CM

## 2014-03-27 DIAGNOSIS — F331 Major depressive disorder, recurrent, moderate: Secondary | ICD-10-CM

## 2014-03-27 DIAGNOSIS — Z1211 Encounter for screening for malignant neoplasm of colon: Secondary | ICD-10-CM

## 2014-03-27 DIAGNOSIS — M1990 Unspecified osteoarthritis, unspecified site: Secondary | ICD-10-CM

## 2014-03-27 DIAGNOSIS — Z13220 Encounter for screening for lipoid disorders: Secondary | ICD-10-CM

## 2014-03-27 DIAGNOSIS — Z0000 Encounter for general adult medical examination without abnormal findings: Secondary | ICD-10-CM

## 2014-03-27 LAB — BASIC METABOLIC PANEL WITH GFR
BUN: 20 mg/dL (ref 6–23)
CHLORIDE: 103 meq/L (ref 96–112)
CO2: 26 mEq/L (ref 19–32)
Calcium: 10 mg/dL (ref 8.4–10.5)
Creat: 0.85 mg/dL (ref 0.50–1.10)
GFR, EST AFRICAN AMERICAN: 84 mL/min
GFR, EST NON AFRICAN AMERICAN: 73 mL/min
GLUCOSE: 88 mg/dL (ref 70–99)
POTASSIUM: 4 meq/L (ref 3.5–5.3)
Sodium: 139 mEq/L (ref 135–145)

## 2014-03-27 LAB — LIPID PANEL
CHOL/HDL RATIO: 3.3 ratio
CHOLESTEROL: 217 mg/dL — AB (ref 0–200)
HDL: 65 mg/dL (ref >=46)
LDL Cholesterol: 121 mg/dL — ABNORMAL HIGH (ref 0–99)
TRIGLYCERIDES: 153 mg/dL — AB (ref <150)
VLDL: 31 mg/dL (ref 0–40)

## 2014-03-27 LAB — CBC
HEMATOCRIT: 37.9 % (ref 36.0–46.0)
HEMOGLOBIN: 12.2 g/dL (ref 12.0–15.0)
MCH: 27 pg (ref 26.0–34.0)
MCHC: 32.2 g/dL (ref 30.0–36.0)
MCV: 83.8 fL (ref 78.0–100.0)
MPV: 10.2 fL (ref 8.6–12.4)
PLATELETS: 204 10*3/uL (ref 150–400)
RBC: 4.52 MIL/uL (ref 3.87–5.11)
RDW: 14.1 % (ref 11.5–15.5)
WBC: 6.9 10*3/uL (ref 4.0–10.5)

## 2014-03-27 MED ORDER — TRAMADOL HCL 50 MG PO TABS: 50.0000 mg | ORAL_TABLET | Freq: Three times a day (TID) | ORAL | Status: DC | PRN

## 2014-03-27 NOTE — Patient Instructions (Signed)
It was nice to see you today.  Please return the stool cards.  You can get your mammogram this year or the next.  Use the medication as needed for your pain.  Follow up annually.  Take care  Dr. Lacinda Axon

## 2014-03-27 NOTE — Assessment & Plan Note (Signed)
Patient with prior x-rays with degenerative changes noted in lumbar spine. Likely ongoing degenerative changes of the knees as well.  I advised use of scheduled Tylenol and patient refused stating that does not work. Advised her to not take NSAIDs on a chronic basis. Patient given tramadol as needed for pain.

## 2014-03-27 NOTE — Progress Notes (Signed)
   Subjective:    Patient ID: Kimberly Guerrero, female    DOB: Oct 15, 1950, 64 y.o.   MRN: [id number]  HPI 64 year old female with past medical history of depression, osteoporosis, and chronic fatigue presents today for an annual physical exam.  Current concerns:  Knee pain/Back pain  Patient reports long-standing knee pain/low back pain particularly over the past year.  She reports physical activity exacerbates her pain. Pain also limits physical activity.  Mild relief with NSAIDS. She takes daily Voltaren.  She denies any associated numbness/tingling/radiating pain.  Preventative care  Last pap smear was 08/2012 and was negative.  Last mammogram was 10/2013 and was negative.   She is in need of influenza vaccine and colonoscopy.  PMH reviewed. Past Medical History  Diagnosis Date  . Depression   . Chronic fatigue   . Osteoarthritis    Surgical hx - None.  Family Hx - Denies any family history.  Social History reviewed. History  Substance Use Topics  . Smoking status: Never Smoker   . Smokeless tobacco: Not on file  . Alcohol Use: No   Review of Systems Reports back pain and bilateral knee pain. No reports of SOB, chest pain, recent fever, chills. She also notes chronic fatigue.    Objective:   Physical Exam Filed Vitals:   03/27/14 1406  BP: 131/69  Pulse: 62  Temp: 98.5 F (36.9 C)   Exam: General: well appearing, NAD. HEENT: NCAT. Normal TMs bilaterally. Oropharyx clear.  Cardiovascular: RRR. No murmurs, rubs, or gallops. Respiratory: CTAB. No rales, rhonchi, or wheeze. Abdomen: soft, nontender, nondistended. Extremities:  No LE edema. Back - Loss of lordosis. Decreased ROM in all planes.   Knee: Bilateral. Normal to inspection with no erythema or effusion or obvious bony abnormalities. Palpation normal with no warmth, joint line tenderness, patellar tenderness, or condyle tenderness. ROM full in flexion and extension and lower leg  rotation. Ligaments with solid consistent endpoints including ACL, PCL, LCL, MCL. Patellar and quadriceps tendons unremarkable.  Skin: Warm, dry, intact.  Assessment & Plan:  See Problem List

## 2014-03-27 NOTE — Assessment & Plan Note (Addendum)
Patient refuses influenza vaccine. Patient also refuses colonoscopy. Will complete stool cards. Discussed screening mammogram.  Patient to get either later this year versus next year given current guidelines. Pap smear - not indicated currently.  Due in 2017. Screening Lipid panel today.

## 2014-03-28 ENCOUNTER — Encounter: Payer: Self-pay | Admitting: Family Medicine

## 2014-04-05 ENCOUNTER — Encounter: Payer: Self-pay | Admitting: *Deleted

## 2014-04-05 NOTE — Progress Notes (Unsigned)
Pt in nurse clinic requesting a different medication other than Tramadol.  Pt stated that Tramadol made her vomit, dizzy and weak.  Will forward to PCP.  Derl Barrow, RN

## 2014-04-05 NOTE — Progress Notes (Signed)
Patient will need to be seen before change in pain medication is made.

## 2014-04-17 LAB — POC HEMOCCULT BLD/STL (HOME/3-CARD/SCREEN)
Card #2 Fecal Occult Blod, POC: NEGATIVE
Card #3 Fecal Occult Blood, POC: NEGATIVE
Fecal Occult Blood, POC: NEGATIVE

## 2014-04-17 NOTE — Addendum Note (Signed)
Addended by: Martin[place], Jala Dundon on: 04/17/2014 09:54 AM   Modules accepted: Orders

## 2014-07-03 ENCOUNTER — Other Ambulatory Visit: Payer: Self-pay | Admitting: *Deleted

## 2014-07-03 ENCOUNTER — Telehealth: Payer: Self-pay

## 2014-07-03 MED ORDER — TRAMADOL HCL 50 MG PO TABS: 50.0000 mg | ORAL_TABLET | Freq: Three times a day (TID) | ORAL | Status: DC | PRN

## 2014-07-03 NOTE — Telephone Encounter (Signed)
Called patient and informed her that her prescription is up front and available for pickup.Kimberly Guerrero

## 2014-07-03 NOTE — Telephone Encounter (Signed)
Pt in nurse clinic requesting a refill on Tramadol.  Pt stated she started taking the medication again with no problems.  Derl Barrow, RN

## 2014-07-19 ENCOUNTER — Ambulatory Visit: Payer: Self-pay

## 2014-09-20 ENCOUNTER — Other Ambulatory Visit: Payer: Self-pay | Admitting: Family Medicine

## 2014-09-20 DIAGNOSIS — Z1231 Encounter for screening mammogram for malignant neoplasm of breast: Secondary | ICD-10-CM

## 2014-10-18 ENCOUNTER — Other Ambulatory Visit: Payer: Self-pay | Admitting: Family Medicine

## 2014-10-18 DIAGNOSIS — F329 Major depressive disorder, single episode, unspecified: Secondary | ICD-10-CM

## 2014-10-18 DIAGNOSIS — F32A Depression, unspecified: Secondary | ICD-10-CM

## 2014-10-18 MED ORDER — CITALOPRAM HYDROBROMIDE 40 MG PO TABS: 40.0000 mg | ORAL_TABLET | Freq: Every day | ORAL | Status: DC

## 2014-10-18 NOTE — Telephone Encounter (Signed)
Refilled x 2 months.  Thanks, Archie Patten, MD [facility] Family Medicine Resident  10/18/2014, 9:45 AM

## 2014-10-18 NOTE — Telephone Encounter (Signed)
Pt calling to request a refill on citalopram (CELEXA) 40 MG tablet to be sent to [facility] at [facility]. Sadie Reynolds, ASA

## 2014-10-23 ENCOUNTER — Ambulatory Visit (HOSPITAL_COMMUNITY)
Admission: RE | Admit: 2014-10-23 | Discharge: 2014-10-23 | Disposition: A | Discharge status: Home / Self Care | Payer: No Typology Code available for payment source | Source: Ambulatory Visit | Attending: Family Medicine | Admitting: Family Medicine

## 2014-10-23 DIAGNOSIS — Z1231 Encounter for screening mammogram for malignant neoplasm of breast: Secondary | ICD-10-CM

## 2014-11-03 ENCOUNTER — Encounter: Payer: Self-pay | Admitting: Family Medicine

## 2014-11-03 ENCOUNTER — Ambulatory Visit (INDEPENDENT_AMBULATORY_CARE_PROVIDER_SITE_OTHER): Payer: No Typology Code available for payment source | Admitting: Family Medicine

## 2014-11-03 VITALS — BP 126/60 | HR 98 | Temp 98.0°F | Wt 167.0 lb

## 2014-11-03 DIAGNOSIS — J309 Allergic rhinitis, unspecified: Secondary | ICD-10-CM | POA: Insufficient documentation

## 2014-11-03 DIAGNOSIS — J209 Acute bronchitis, unspecified: Secondary | ICD-10-CM

## 2014-11-03 DIAGNOSIS — J301 Allergic rhinitis due to pollen: Secondary | ICD-10-CM

## 2014-11-03 MED ORDER — ALBUTEROL SULFATE HFA 108 (90 BASE) MCG/ACT IN AERS: 2.0000 | INHALATION_SPRAY | Freq: Four times a day (QID) | RESPIRATORY_TRACT | Status: DC | PRN

## 2014-11-03 MED ORDER — FLUTICASONE PROPIONATE 50 MCG/ACT NA SUSP: 2.0000 | Freq: Every day | NASAL | Status: DC | PRN

## 2014-11-03 MED ORDER — CETIRIZINE HCL 10 MG PO TABS: 10.0000 mg | ORAL_TABLET | Freq: Every day | ORAL | Status: DC

## 2014-11-03 NOTE — Patient Instructions (Signed)
Allergic Rhinitis Allergic rhinitis is when the mucous membranes in the nose respond to allergens. Allergens are particles in the air that cause your body to have an allergic reaction. This causes you to release allergic antibodies. Through a chain of events, these eventually cause you to release histamine into the blood stream. Although meant to protect the body, it is this release of histamine that causes your discomfort, such as frequent sneezing, congestion, and an itchy, runny nose.  CAUSES Seasonal allergic rhinitis (hay fever) is caused by pollen allergens that may come from grasses, trees, and weeds. Year-round allergic rhinitis (perennial allergic rhinitis) is caused by allergens such as house dust mites, pet dander, and mold spores. SYMPTOMS  Nasal stuffiness (congestion).  Itchy, runny nose with sneezing and tearing of the eyes. DIAGNOSIS Your health care provider can help you determine the allergen or allergens that trigger your symptoms. If you and your health care provider are unable to determine the allergen, skin or blood testing may be used. Your health care provider will diagnose your condition after taking your health history and performing a physical exam. Your health care provider may assess you for other related conditions, such as asthma, pink eye, or an ear infection. TREATMENT Allergic rhinitis does not have a cure, but it can be controlled by:  Medicines that block allergy symptoms. These may include allergy shots, nasal sprays, and oral antihistamines.  Avoiding the allergen. Hay fever may often be treated with antihistamines in pill or nasal spray forms. Antihistamines block the effects of histamine. There are over-the-counter medicines that may help with nasal congestion and swelling around the eyes. Check with your health care provider before taking or giving this medicine. If avoiding the allergen or the medicine prescribed do not work, there are many new medicines  your health care provider can prescribe. Stronger medicine may be used if initial measures are ineffective. Desensitizing injections can be used if medicine and avoidance does not work. Desensitization is when a patient is given ongoing shots until the body becomes less sensitive to the allergen. Make sure you follow up with your health care provider if problems continue. HOME CARE INSTRUCTIONS It is not possible to completely avoid allergens, but you can reduce your symptoms by taking steps to limit your exposure to them. It helps to know exactly what you are allergic to so that you can avoid your specific triggers. SEEK MEDICAL CARE IF:  You have a fever.  You develop a cough that does not stop easily (persistent).  You have shortness of breath.  You start wheezing.  Symptoms interfere with normal daily activities.   This information is not intended to replace advice given to you by your health care provider. Make sure you discuss any questions you have with your health care provider.   Document Released: 10/08/2000 Document Revised: 02/03/2014 Document Reviewed: 09/20/2012 Elsevier Interactive Patient Education 2016 [employer] Inc.  

## 2014-11-10 NOTE — Progress Notes (Signed)
   Subjective:   Kimberly Guerrero is a 64 y.o. female with a history of OA here for cough  COUGH  Has been coughing for 1 month Cough is: dry, worst at night Sputum production: none Medications tried: robitussin, niquil Taking blood pressure medications: no  Symptoms Runny nose: some Mucous in back of throat: yes Throat burning or reflux: no Wheezing or asthma: no Fever: no Chest Pain: no Shortness of breath: no Leg swelling: no Hemoptysis: no Weight loss: no   Review of Systems:  Per HPI. All other systems reviewed and are negative.   PMH, PSH, Medications, Allergies, and FmHx reviewed and updated in EMR.  Social History: never smoker  Objective:  BP 126/60 mmHg  Pulse 98  Temp(Src) 98 F (36.7 C) (Oral)  Wt 167 lb (75.751 kg)  SpO2 99%  Gen:  64 y.o. female in NAD HEENT: NCAT, MMM, EOMI, PERRL, anicteric sclerae, mild oropharyngeal erythema and postnasal drip, erythematous turbinates CV: RRR, no MRG, no JVD Resp: Non-labored, CTAB, no wheezes noted Abd: Soft, NTND, BS present, no guarding or organomegaly Ext: WWP, no edema MSK: Full ROM, strength intact Neuro: Alert and oriented, speech normal      Chemistry      Component Value Date/Time   NA 139 03/27/2014 1506   K 4.0 03/27/2014 1506   CL 103 03/27/2014 1506   CO2 26 03/27/2014 1506   BUN 20 03/27/2014 1506   CREATININE 0.85 03/27/2014 1506   CREATININE 0.91 12/29/2009 1209      Component Value Date/Time   CALCIUM 10.0 03/27/2014 1506   ALKPHOS 84 12/29/2009 1209   AST 21 12/29/2009 1209   ALT 12 12/29/2009 1209   BILITOT 0.5 12/29/2009 1209      Lab Results  Component Value Date   WBC 6.9 03/27/2014   HGB 12.2 03/27/2014   HCT 37.9 03/27/2014   MCV 83.8 03/27/2014   PLT 204 03/27/2014   Lab Results  Component Value Date   TSH 0.688 12/29/2009   Lab Results  Component Value Date   HGBA1C 5.9 11/06/2009   Assessment & Plan:     Kimberly Guerrero is a 64 y.o. female here for  cough  Allergic rhinitis Cough x1 month, worse at night, dry, no fever, some eye itching. Sounds allergic. Normal exam except for red turbinates and postnasal drip - trial of zyrtec and flonase - albuterol prn - f/u with PCP in 2 weeks       Beverlyn Roux, MD, MPH [facility] Family Medicine PGY-3 11/10/2014 3:01 PM

## 2014-11-10 NOTE — Assessment & Plan Note (Signed)
Cough x1 month, worse at night, dry, no fever, some eye itching. Sounds allergic. Normal exam except for red turbinates and postnasal drip - trial of zyrtec and flonase - albuterol prn - f/u with PCP in 2 weeks

## 2014-12-26 ENCOUNTER — Other Ambulatory Visit: Payer: Self-pay | Admitting: Family Medicine

## 2014-12-26 DIAGNOSIS — F32A Depression, unspecified: Secondary | ICD-10-CM

## 2014-12-26 DIAGNOSIS — F329 Major depressive disorder, single episode, unspecified: Secondary | ICD-10-CM

## 2014-12-26 NOTE — Telephone Encounter (Signed)
Needs refill on citalopram  [facility] on [address]

## 2014-12-27 MED ORDER — CITALOPRAM HYDROBROMIDE 40 MG PO TABS: 40.0000 mg | ORAL_TABLET | Freq: Every day | ORAL | Status: DC

## 2015-01-18 ENCOUNTER — Ambulatory Visit: Payer: No Typology Code available for payment source

## 2015-01-23 ENCOUNTER — Ambulatory Visit (INDEPENDENT_AMBULATORY_CARE_PROVIDER_SITE_OTHER): Payer: No Typology Code available for payment source | Admitting: Family Medicine

## 2015-01-23 ENCOUNTER — Encounter: Payer: Self-pay | Admitting: Family Medicine

## 2015-01-23 VITALS — BP 127/66 | HR 52 | Temp 98.2°F | Wt 161.0 lb

## 2015-01-23 DIAGNOSIS — H1131 Conjunctival hemorrhage, right eye: Secondary | ICD-10-CM

## 2015-01-23 NOTE — Patient Instructions (Signed)
Subconjunctival Hemorrhage °Subconjunctival hemorrhage is bleeding that happens between the white part of your eye (sclera) and the clear membrane that covers the outside of your eye (conjunctiva). There are many tiny blood vessels near the surface of your eye. A subconjunctival hemorrhage happens when one or more of these vessels breaks and bleeds, causing a red patch to appear on your eye. This is similar to a bruise. °Depending on the amount of bleeding, the red patch may only cover a small area of your eye or it may cover the entire visible part of the sclera. If a lot of blood collects under the conjunctiva, there may also be swelling. Subconjunctival hemorrhages do not affect your vision or cause pain, but your eye may feel irritated if there is swelling. Subconjunctival hemorrhages usually do not require treatment, and they disappear on their own within two weeks. °CAUSES °This condition may be caused by: °· Mild trauma, such as rubbing your eye too hard. °· Severe trauma or blunt injuries. °· Coughing, sneezing, or vomiting. °· Straining, such as when lifting a heavy object. °· High blood pressure. °· Recent eye surgery. °· A history of diabetes. °· Certain medicines, especially blood thinners (anticoagulants). °· Other conditions, such as eye tumors, bleeding disorders, or blood vessel abnormalities. °Subconjunctival hemorrhages can happen without an obvious cause.  °SYMPTOMS  °Symptoms of this condition include: °· A bright red or dark red patch on the white part of the eye. °¨ The red area may spread out to cover a larger area of the eye before it goes away. °¨ The red area may turn brownish-yellow before it goes away. °· Swelling. °· Mild eye irritation. °DIAGNOSIS °This condition is diagnosed with a physical exam. If your subconjunctival hemorrhage was caused by trauma, your health care provider may refer you to an eye specialist (ophthalmologist) or another specialist to check for other injuries. You  may have other tests, including: °· An eye exam. °· A blood pressure check. °· Blood tests to check for bleeding disorders. °If your subconjunctival hemorrhage was caused by trauma, X-rays or a CT scan may be done to check for other injuries. °TREATMENT °Usually, no treatment is needed. Your health care provider may recommend eye drops or cold compresses to help with discomfort. °HOME CARE INSTRUCTIONS °· Take over-the-counter and prescription medicines only as directed by your health care provider. °· Use eye drops or cold compresses to help with discomfort as directed by your health care provider. °· Avoid activities, things, and environments that may irritate or injure your eye. °· Keep all follow-up visits as told by your health care provider. This is important. °SEEK MEDICAL CARE IF: °· You have pain in your eye. °· The bleeding does not go away within 3 weeks. °· You keep getting new subconjunctival hemorrhages. °SEEK IMMEDIATE MEDICAL CARE IF: °· Your vision changes or you have difficulty seeing. °· You suddenly develop severe sensitivity to light. °· You develop a severe headache, persistent vomiting, confusion, or abnormal tiredness (lethargy). °· Your eye seems to bulge or protrude from your eye socket. °· You develop unexplained bruises on your body. °· You have unexplained bleeding in another area of your body. °  °This information is not intended to replace advice given to you by your health care provider. Make sure you discuss any questions you have with your health care provider. °  °Document Released: 01/13/2005 Document Revised: 10/04/2014 Document Reviewed: 03/22/2014 °Elsevier Interactive Patient Education ©2016 Elsevier Inc. ° °

## 2015-01-23 NOTE — Assessment & Plan Note (Signed)
Appearance consistent with subconjunctival hemorrhage No trauma, foreign body sensation, and vision intact Advised lubricating eyedrops like artificial tears Discussed natural course with patient and that this will take a few weeks to resorb  Stop using Visine Return precautions given

## 2015-01-23 NOTE — Progress Notes (Signed)
   Subjective:   Kimberly Guerrero is a 64 y.o. female with a history of depression, chronic fatigue here for same day appointment for bleeding eye.  Patient reports that she woke up yesterday morning and noticed blood in her right eye. She denies any straining or trauma preceding this incident. She doesn't think she's never had bleeding like this before. She has had pinkeye before and wonders if this might be pinkeye again. She tried putting Visine in her eye and it burned. She denies any changes in vision, fevers, eye pain, foreign body sensation. She only has mild tenderness of her right eye.  Review of Systems:  Per HPI. All other systems reviewed and are negative.   PMH, PSH, Medications, Allergies, and FmHx reviewed and updated in EMR.  Social History: Never smoker  Objective:  BP 127/66 mmHg  Pulse 52  Temp(Src) 98.2 F (36.8 C) (Oral)  Wt 161 lb (73.029 kg)  Gen:  64 y.o. female in NAD, sitting comfortably in chair HEENT: NCAT, MMM, EOMI, PERRL, so conjunctival hemorrhage over lateral right eye CV: Regular rate Resp: Non-labored MSK: No obvious deformities, moves all 4 extremities Neuro: Alert and oriented, speech normal   Assessment & Plan:     Kimberly Guerrero is a 64 y.o. female here for right subconjunctival hemorrhage  Subconjunctival hemorrhage of right eye Appearance consistent with subconjunctival hemorrhage No trauma, foreign body sensation, and vision intact Advised lubricating eyedrops like artificial tears Discussed natural course with patient and that this will take a few weeks to resorb  Stop using Visine Return precautions given    Virginia Crews, MD MPH PGY-2,  [facility] 01/23/2015  10:00 AM

## 2015-02-13 ENCOUNTER — Telehealth: Payer: Self-pay | Admitting: *Deleted

## 2015-02-13 NOTE — Telephone Encounter (Signed)
Called patient to offer flu vaccine. Scheduled patient to receive flu vaccine on 02/19/2015 at 0900. Velora Heckler, RN

## 2015-02-19 ENCOUNTER — Ambulatory Visit (INDEPENDENT_AMBULATORY_CARE_PROVIDER_SITE_OTHER): Payer: No Typology Code available for payment source | Admitting: *Deleted

## 2015-02-19 DIAGNOSIS — Z23 Encounter for immunization: Secondary | ICD-10-CM

## 2015-02-27 ENCOUNTER — Ambulatory Visit (INDEPENDENT_AMBULATORY_CARE_PROVIDER_SITE_OTHER): Payer: No Typology Code available for payment source | Admitting: Family Medicine

## 2015-02-27 ENCOUNTER — Encounter: Payer: Self-pay | Admitting: Family Medicine

## 2015-02-27 VITALS — BP 107/55 | HR 55 | Temp 98.4°F | Ht 69.0 in | Wt 162.2 lb

## 2015-02-27 DIAGNOSIS — Z0000 Encounter for general adult medical examination without abnormal findings: Secondary | ICD-10-CM

## 2015-02-27 DIAGNOSIS — F331 Major depressive disorder, recurrent, moderate: Secondary | ICD-10-CM

## 2015-02-27 DIAGNOSIS — R079 Chest pain, unspecified: Secondary | ICD-10-CM

## 2015-02-27 DIAGNOSIS — Z1211 Encounter for screening for malignant neoplasm of colon: Secondary | ICD-10-CM

## 2015-02-27 DIAGNOSIS — J309 Allergic rhinitis, unspecified: Secondary | ICD-10-CM

## 2015-02-27 LAB — CBC
HEMATOCRIT: 38.6 % (ref 36.0–46.0)
Hemoglobin: 12.4 g/dL (ref 12.0–15.0)
MCH: 26.4 pg (ref 26.0–34.0)
MCHC: 32.1 g/dL (ref 30.0–36.0)
MCV: 82.1 fL (ref 78.0–100.0)
MPV: 10.5 fL (ref 8.6–12.4)
Platelets: 207 10*3/uL (ref 150–400)
RBC: 4.7 MIL/uL (ref 3.87–5.11)
RDW: 14.1 % (ref 11.5–15.5)
WBC: 6 10*3/uL (ref 4.0–10.5)

## 2015-02-27 LAB — COMPLETE METABOLIC PANEL WITH GFR
ALT: 10 U/L (ref 6–29)
AST: 16 U/L (ref 10–35)
Albumin: 3.9 g/dL (ref 3.6–5.1)
Alkaline Phosphatase: 92 U/L (ref 33–130)
BUN: 23 mg/dL (ref 7–25)
CALCIUM: 10 mg/dL (ref 8.6–10.4)
CHLORIDE: 100 mmol/L (ref 98–110)
CO2: 26 mmol/L (ref 20–31)
Creat: 1.03 mg/dL — ABNORMAL HIGH (ref 0.50–0.99)
GFR, EST AFRICAN AMERICAN: 66 mL/min (ref >=60)
GFR, Est Non African American: 58 mL/min — ABNORMAL LOW (ref >=60)
Glucose, Bld: 86 mg/dL (ref 65–99)
POTASSIUM: 4.4 mmol/L (ref 3.5–5.3)
Sodium: 136 mmol/L (ref 135–146)
Total Bilirubin: 0.3 mg/dL (ref 0.2–1.2)
Total Protein: 7.1 g/dL (ref 6.1–8.1)

## 2015-02-27 LAB — LIPID PANEL
CHOLESTEROL: 196 mg/dL (ref 125–200)
HDL: 72 mg/dL (ref >=46)
LDL CALC: 95 mg/dL (ref <130)
TRIGLYCERIDES: 145 mg/dL (ref <150)
Total CHOL/HDL Ratio: 2.7 Ratio (ref <=5.0)
VLDL: 29 mg/dL (ref <30)

## 2015-02-27 LAB — POCT GLYCOSYLATED HEMOGLOBIN (HGB A1C): Hemoglobin A1C: 6

## 2015-02-27 NOTE — Progress Notes (Signed)
Subjective:   Kimberly Guerrero is a 65 y.o. female with a history of depression, osteoarthritis, chronic fatigue, allergic rhinitis here for well woman/preventative visit  Acute Concerns:  Itching at night on hands and arms - every night - couple of weeks - hasnt noticed bite marks or rash - better when turning heat down - took benadryl last night  Rhinorrhea - had a cold recently thinks this is the end of it - could be allergy related, not taking allergy meds regularly  Has had handicap sticker in the past - Wonders if she should have one again - Gets tired of walking, no SOB or CP - Has been present for a long time  Depression - Taking celexa 40mg  daily - Pretty good mood - No SI/HI  Chest pain - Occurs in the mornings once every 2-3 weeks - Happened once earlier this week - Patient unable to describe pain, but reports it's in the center of her upper chest - Lasts for seconds to minutes at a time - No associated nausea, shortness of breath, diaphoresis - Occurs while she is making breakfast or working around the kitchen, but not while she is walking around - She used to have a cardiologist many many years ago and thinks she may have had a stress test at that point   Diet: heaviest meal at lunch - meat, veggie, starch, light dinner, breakfast - cereal with fruit, mostly drinks water, diet soda every once in a while  Exercise: stretching, sit ups, walking - every day for while running errands  Sexual/Birth History: G1P1001, not sexually active  Birth Control: menopause  POA/Living Will: doesn't have one, knows about  Review of Systems: Per HPI.   PMH, PSH, Medications, Allergies, and FmHx reviewed and updated in EMR.  Social History: never smoker  Immunization:  Tdap/TD: 06/06/2011  Influenza: 02/19/2015  Pneumococcal: n/a  Herpes Zoster: 06/06/2011  Cancer Screening:  Pap Smear: 08/2012, normal, repeat in 08/2015  Mammogram: 09/2014, normal, repeat in  09/2016  Colonoscopy: stool cards in 03/2014  Dexa: n/a   Objective:  BP 107/55 mmHg  Pulse 55  Temp(Src) 98.4 F (36.9 C) (Oral)  Ht 5\' 9"  (1.753 m)  Wt 162 lb 3.2 oz (73.573 kg)  BMI 23.94 kg/m2  Gen:  65 y.o. female in NAD HEENT: NCAT, MMM, EOMI, PERRL, anicteric sclerae CV: RRR, no MRG, no JVD, no chest wall tenderness to palpation Resp: Non-labored, CTAB, no wheezes noted Abd: Soft, NTND, BS present, no guarding or organomegaly Ext: WWP, no edema   MSK: Full ROM, strength intact Neuro: Alert and oriented, speech normal      Assessment & Plan:     Kimberly Guerrero is a 65 y.o. female here for annual well woman/preventative exam.  Preventative health care Doing well Advised continued healthy diet and more dedicated exercise Pap smear and mammogram up-to-date Patient refuses colonoscopy, will complete stool cards Screening A1c, lipid panel, CMET, CBC, HIV, HCV today Follow-up in one year  Allergic rhinitis Suspect itching and rhinorrhea are related to allergies Advised patient to take Zyrtec regularly and consider taking it at night if her symptoms are worse at that time  DEPRESSION, MODERATE, RECURRENT Well-controlled Mood is good, no SI Continue Celexa 40 mg daily  Chest pain Story not entirely consistent with cardiac etiology of chest pain Due to patient's age and therefore risk of heart disease, will refer to cardiology for possible stress testing No active chest pain currently Return precautions discussed  Virginia Crews, MD MPH PGY-2,  [facility] 02/27/2015  10:19 AM

## 2015-02-27 NOTE — Assessment & Plan Note (Signed)
Suspect itching and rhinorrhea are related to allergies Advised patient to take Zyrtec regularly and consider taking it at night if her symptoms are worse at that time

## 2015-02-27 NOTE — Assessment & Plan Note (Signed)
Well-controlled Mood is good, no SI Continue Celexa 40 mg daily

## 2015-02-27 NOTE — Assessment & Plan Note (Signed)
Story not entirely consistent with cardiac etiology of chest pain Due to patient's age and therefore risk of heart disease, will refer to cardiology for possible stress testing No active chest pain currently Return precautions discussed

## 2015-02-27 NOTE — Assessment & Plan Note (Signed)
Doing well Advised continued healthy diet and more dedicated exercise Pap smear and mammogram up-to-date Patient refuses colonoscopy, will complete stool cards Screening A1c, lipid panel, CMET, CBC, HIV, HCV today Follow-up in one year

## 2015-02-27 NOTE — Patient Instructions (Signed)
Nice to see you again today. Start taking your allergy medicine more regularly. Let me know if the itching does not get better restart noticing a rash.  We will get some blood work today and someone will call you or send you a letter with the results when they're available. Please complete the stool cards as directed and bring them back to the clinic when they're done. I will see you in August for your Pap smear.   Take care, Dr. B  Things to do to keep yourself healthy  - Exercise at least 30-45 minutes a day, 3-4 days a week.  - Eat a low-fat diet with lots of fruits and vegetables, up to 7-9 servings per day.  - Seatbelts can save your life. Wear them always.  - Smoke detectors on every level of your home, check batteries every year.  - Eye Doctor - have an eye exam every 1-2 years  - Safe sex - if you may be exposed to STDs, use a condom.  - Alcohol -  If you drink, do it moderately, less than 2 drinks per day.  - [place]. Choose someone to speak for you if you are not able.  - Depression is common in our stressful world.If you're feeling down or losing interest in things you normally enjoy, please come in for a visit.  - Violence - If anyone is threatening or hurting you, please call immediately.

## 2015-02-28 ENCOUNTER — Encounter: Payer: Self-pay | Admitting: Family Medicine

## 2015-02-28 LAB — HIV ANTIBODY (ROUTINE TESTING W REFLEX): HIV: NONREACTIVE

## 2015-02-28 LAB — HEPATITIS C ANTIBODY: HCV AB: NEGATIVE

## 2015-03-05 ENCOUNTER — Other Ambulatory Visit: Payer: Self-pay | Admitting: Family Medicine

## 2015-03-05 NOTE — Telephone Encounter (Signed)
Refill request for baclofen. Currently not on pt med list.

## 2015-03-06 NOTE — Telephone Encounter (Signed)
Patient has not been prescribed Baclofen since 2014.  Will not refill at this time.  Patient will need appt in clinic for re-evaluation if she requires baclofen.  Virginia Crews, MD, MPH PGY-2,  [facility] 03/06/2015 10:34 AM

## 2015-03-08 LAB — POC HEMOCCULT BLD/STL (HOME/3-CARD/SCREEN)
Card #2 Fecal Occult Blod, POC: NEGATIVE
Card #3 Fecal Occult Blood, POC: NEGATIVE
FECAL OCCULT BLD: NEGATIVE

## 2015-03-08 NOTE — Addendum Note (Signed)
Addended by: [place] Pink on: 03/08/2015 01:49 PM   Modules accepted: Orders

## 2015-03-27 ENCOUNTER — Telehealth: Payer: Self-pay | Admitting: Family Medicine

## 2015-03-27 DIAGNOSIS — J209 Acute bronchitis, unspecified: Secondary | ICD-10-CM

## 2015-03-27 NOTE — Telephone Encounter (Signed)
Patient is needing a refill of Ventolin.  [facility]

## 2015-03-28 MED ORDER — ALBUTEROL SULFATE HFA 108 (90 BASE) MCG/ACT IN AERS: 2.0000 | INHALATION_SPRAY | Freq: Four times a day (QID) | RESPIRATORY_TRACT | Status: DC | PRN

## 2015-03-28 NOTE — Telephone Encounter (Signed)
Refill Sent  Virginia Crews, MD, MPH PGY-2,  [facility] 03/28/2015 8:45 AM

## 2015-03-30 MED ORDER — ALBUTEROL SULFATE HFA 108 (90 BASE) MCG/ACT IN AERS: 2.0000 | INHALATION_SPRAY | Freq: Four times a day (QID) | RESPIRATORY_TRACT | Status: DC | PRN

## 2015-03-30 NOTE — Addendum Note (Signed)
Addended by: Derl Barrow on: 03/30/2015 11:12 AM   Modules accepted: Orders

## 2015-03-30 NOTE — Telephone Encounter (Signed)
Pt came in and wanted to speak to a nurse. I checked with Tamika and she was currently with a pt. I informed Mrs. Coreas that she would need to wait.   Pt explained the following: Pt is here and was asking about this medication and [facility]  Pt would like for [place] to send this to [facility] in [facility]. Sadie Reynolds, ASA

## 2015-03-30 NOTE — Telephone Encounter (Signed)
Rx resent to [facility] per patient request.  Derl Barrow, RN

## 2015-03-31 ENCOUNTER — Other Ambulatory Visit: Payer: Self-pay | Admitting: Family Medicine

## 2015-04-02 NOTE — Telephone Encounter (Signed)
Pt called because she needs a refill on her Celexa called in. She only has 2 days worth left. jw

## 2015-04-26 ENCOUNTER — Ambulatory Visit (INDEPENDENT_AMBULATORY_CARE_PROVIDER_SITE_OTHER): Payer: Self-pay | Admitting: Family Medicine

## 2015-04-26 ENCOUNTER — Encounter: Payer: Self-pay | Admitting: Family Medicine

## 2015-04-26 ENCOUNTER — Other Ambulatory Visit (HOSPITAL_COMMUNITY)
Admission: RE | Admit: 2015-04-26 | Discharge: 2015-04-26 | Disposition: A | Discharge status: Home / Self Care | Payer: No Typology Code available for payment source | Source: Ambulatory Visit | Attending: Family Medicine | Admitting: Family Medicine

## 2015-04-26 VITALS — BP 100/68 | HR 50 | Temp 98.3°F | Ht 69.0 in | Wt 162.0 lb

## 2015-04-26 DIAGNOSIS — Z1151 Encounter for screening for human papillomavirus (HPV): Secondary | ICD-10-CM | POA: Insufficient documentation

## 2015-04-26 DIAGNOSIS — Z01419 Encounter for gynecological examination (general) (routine) without abnormal findings: Secondary | ICD-10-CM | POA: Insufficient documentation

## 2015-04-26 DIAGNOSIS — Z124 Encounter for screening for malignant neoplasm of cervix: Secondary | ICD-10-CM | POA: Insufficient documentation

## 2015-04-26 NOTE — Patient Instructions (Signed)
Things to do to keep yourself healthy  - Exercise at least 30-45 minutes a day, 3-4 days a week.  - Eat a low-fat diet with lots of fruits and vegetables, up to 7-9 servings per day.  - Seatbelts can save your life. Wear them always.  - Smoke detectors on every level of your home, check batteries every year.  - Eye Doctor - have an eye exam every 1-2 years  - Safe sex - if you may be exposed to STDs, use a condom.  - Alcohol -  If you drink, do it moderately, less than 2 drinks per day.  - [place]. Choose someone to speak for you if you are not able.  - Depression is common in our stressful world.If you're feeling down or losing interest in things you normally enjoy, please come in for a visit.  - Violence - If anyone is threatening or hurting you, please call immediately.  We will call you with your Pap smear results when they're back.

## 2015-04-26 NOTE — Assessment & Plan Note (Signed)
Pap smear + HPV cotesting collected

## 2015-04-26 NOTE — Progress Notes (Signed)
   Subjective:   Kimberly Guerrero is a 65 y.o. female with a history of Depression, osteoarthritis, chronic fatigue, allergic rhinitis here for Pap smear.  Patient reports she has not been sexually active in many years. She denies any need for any STD testing today. She denies any vaginal discharge or bleeding.  Review of Systems:  Per HPI.   Social History: never smoker  Objective:  BP 100/68 mmHg  Pulse 50  Temp(Src) 98.3 F (36.8 C) (Oral)  Ht 5\' 9"  (1.753 m)  Wt 162 lb (73.483 kg)  BMI 23.91 kg/m2  SpO2 98%  Gen:  65 y.o. female in NAD HEENT: NCAT, MMM Abd: Soft, NTND, BS present, no guarding or organomegaly GYN:  External genitalia within normal limits.  Vaginal mucosa pink, moist, normal rugae.  Nonfriable cervix without lesions, no discharge or bleeding noted on speculum exam.  Bimanual exam revealed normal, nongravid uterus.  No cervical motion tenderness. No adnexal masses bilaterally.   Ext: WWP, no edema Neuro: Alert and oriented, speech normal    Assessment & Plan:     Kimberly Guerrero is a 65 y.o. female here for pap smear  Screening for cervical cancer Pap smear + HPV cotesting collected    Virginia Crews, MD MPH PGY-2,  [facility] 04/26/2015  3:05 PM

## 2015-04-30 ENCOUNTER — Encounter: Payer: Self-pay | Admitting: Family Medicine

## 2015-04-30 LAB — CYTOLOGY - PAP

## 2015-05-14 ENCOUNTER — Other Ambulatory Visit: Payer: Self-pay | Admitting: *Deleted

## 2015-05-14 ENCOUNTER — Other Ambulatory Visit: Payer: Self-pay | Admitting: Family Medicine

## 2015-05-14 DIAGNOSIS — J209 Acute bronchitis, unspecified: Secondary | ICD-10-CM

## 2015-05-14 NOTE — Telephone Encounter (Signed)
Refill request has already been sent to PCP.  Derl Barrow, RN

## 2015-05-14 NOTE — Telephone Encounter (Signed)
Needs refill on albuterol. Please call RX into the [facility].  Cannot pay for it out of her pocket.  She only has about 100 sprays left and she uses it every 6 hrs.

## 2015-05-15 MED ORDER — ALBUTEROL SULFATE HFA 108 (90 BASE) MCG/ACT IN AERS: 2.0000 | INHALATION_SPRAY | Freq: Four times a day (QID) | RESPIRATORY_TRACT | Status: DC | PRN

## 2015-05-18 MED ORDER — ALBUTEROL SULFATE HFA 108 (90 BASE) MCG/ACT IN AERS: 2.0000 | INHALATION_SPRAY | Freq: Four times a day (QID) | RESPIRATORY_TRACT | Status: DC | PRN

## 2015-05-18 NOTE — Addendum Note (Signed)
Addended by: Derl Barrow on: 05/18/2015 10:24 AM   Modules accepted: Orders

## 2015-05-18 NOTE — Telephone Encounter (Signed)
Received call from [facility] stating they did not receive Rx for Ventolin HFA.  Rx faxed to [facility]. HD again.  Derl Barrow, RN

## 2015-07-10 ENCOUNTER — Ambulatory Visit: Payer: No Typology Code available for payment source

## 2015-08-28 ENCOUNTER — Ambulatory Visit: Payer: No Typology Code available for payment source

## 2015-09-13 ENCOUNTER — Other Ambulatory Visit: Payer: Self-pay | Admitting: Family Medicine

## 2015-09-13 DIAGNOSIS — J209 Acute bronchitis, unspecified: Secondary | ICD-10-CM

## 2015-09-13 MED ORDER — ALBUTEROL SULFATE HFA 108 (90 BASE) MCG/ACT IN AERS: 2.0000 | INHALATION_SPRAY | Freq: Four times a day (QID) | RESPIRATORY_TRACT | 1 refills | Status: DC | PRN

## 2015-09-13 NOTE — Telephone Encounter (Signed)
Crystal from [facility] was calling to get a refill on Ventolin for the pt. Crystal's number is [phone] opt. 2. Please advise. Thanks! ep

## 2015-09-13 NOTE — Telephone Encounter (Signed)
Medication refilled

## 2015-09-14 ENCOUNTER — Other Ambulatory Visit: Payer: Self-pay | Admitting: Internal Medicine

## 2015-09-14 MED ORDER — MOMETASONE FUROATE 50 MCG/ACT NA SUSP: 2.0000 | Freq: Every day | NASAL | 12 refills | Status: DC

## 2015-09-14 NOTE — Progress Notes (Signed)
Covering for Dr. Brita Romp. Received fax from [facility] that Pt is currently buying Flonase OTC. [facility] carries Nasonex nasal spray and is requesting that we change her prescription from Flonase to Nasonex so that they can assist with the payment. Will discontinue Flonase. Nasonex prescription faxed to [facility].  Hyman Bible, MD PGY-2

## 2015-09-14 NOTE — Addendum Note (Signed)
Addended by: Hyman Bible D on: 09/14/2015 03:14 PM   Modules accepted: Orders

## 2015-09-20 ENCOUNTER — Telehealth: Payer: Self-pay | Admitting: Family Medicine

## 2015-09-20 ENCOUNTER — Other Ambulatory Visit: Payer: Self-pay | Admitting: Obstetrics and Gynecology

## 2015-09-20 NOTE — Telephone Encounter (Signed)
Pt is calling to let the doctor know that she no longer takes Citalopram and for [place] to take it off her list of medications. j w

## 2015-09-20 NOTE — Telephone Encounter (Signed)
Medication discontinued and should now be off her medication list.

## 2015-10-17 ENCOUNTER — Other Ambulatory Visit: Payer: Self-pay | Admitting: Family Medicine

## 2015-10-17 DIAGNOSIS — Z1231 Encounter for screening mammogram for malignant neoplasm of breast: Secondary | ICD-10-CM

## 2015-10-18 ENCOUNTER — Ambulatory Visit
Admission: RE | Admit: 2015-10-18 | Discharge: 2015-10-18 | Disposition: A | Discharge status: Home / Self Care | Payer: No Typology Code available for payment source | Source: Ambulatory Visit | Attending: Family Medicine | Admitting: Family Medicine

## 2015-10-18 DIAGNOSIS — Z1231 Encounter for screening mammogram for malignant neoplasm of breast: Secondary | ICD-10-CM

## 2015-10-22 ENCOUNTER — Encounter: Payer: Self-pay | Admitting: Family Medicine

## 2015-10-24 ENCOUNTER — Encounter: Payer: Self-pay | Admitting: Family Medicine

## 2015-10-24 ENCOUNTER — Ambulatory Visit (INDEPENDENT_AMBULATORY_CARE_PROVIDER_SITE_OTHER): Payer: Self-pay | Admitting: Family Medicine

## 2015-10-24 VITALS — BP 117/70 | HR 55 | Wt 162.0 lb

## 2015-10-24 DIAGNOSIS — J301 Allergic rhinitis due to pollen: Secondary | ICD-10-CM

## 2015-10-24 DIAGNOSIS — Z23 Encounter for immunization: Secondary | ICD-10-CM

## 2015-10-24 DIAGNOSIS — B070 Plantar wart: Secondary | ICD-10-CM

## 2015-10-24 MED ORDER — CETIRIZINE HCL 10 MG PO TABS: 10.0000 mg | ORAL_TABLET | Freq: Every day | ORAL | 11 refills | Status: DC

## 2015-10-24 NOTE — Patient Instructions (Signed)
Plantar Warts Warts are small growths on the skin. They can occur on various areas of the body. When they occur on the underside (sole) of the foot, they are called plantar warts. Plantar warts often occur in groups, with several small warts around a larger growth. They tend to develop over areas of pressure, such as the heel or the ball of the foot. Most warts are not painful, and they usually do not cause problems. However, plantar warts may cause pain when you walk because pressure is applied to them. Warts often go away on their own in time. Various treatments may be done if needed. Sometimes, warts go away and then they come back again. CAUSES Plantar warts are caused by a type of virus that is called human papillomavirus (HPV). HPV attacks a break in the skin of the foot. Walking barefoot can lead to exposure to the virus. These warts may spread to other areas of the sole. They spread to other areas of the body only through direct contact. RISK FACTORS Plantar warts are more likely to develop in:  People who are 10-20 years of age.  People who use public showers or locker rooms.  People who have a weakened body defense system (immune system). SYMPTOMS Plantar warts may be flat or slightly raised. They may grow into the deeper layers of skin or rise above the surface of the skin. Most plantar warts have a rough surface. They may cause pain when you use your foot to support your body weight. DIAGNOSIS A plantar wart can usually be diagnosed from its appearance. In some cases, a tissue sample may be removed (biopsy) to be looked at under a microscope. TREATMENT In many cases, warts do not need treatment. Without treatment, they often go away over a period of many months to a couple years. If treatment is needed, options may include:  Applying medicated solutions, creams, or patches to the wart. These may be over-the-counter or prescription medicines that make the skin soft so that layers will  gradually shed away. In many cases, the medicine is applied one or two times per day and covered with a bandage.  Putting duct tape over the top of the wart (occlusion). You will leave the tape in place for as long as told by your health care provider, then you will replace it with a new strip of tape. This is done until the wart goes away.  Freezing the wart with liquid nitrogen (cryotherapy).  Burning the wart with:  Laser treatment.  An electrified probe (electrocautery).  Injection of a medicine (Candida antigen) into the wart to help the body's immune system to fight off the wart.  Surgery to remove the wart. HOME CARE INSTRUCTIONS  Apply medicated creams or solutions only as told by your health care provider. This may involve:  Soaking the affected area in warm water.  Removing the top layer of softened skin before you apply the medicine. A pumice stone works well for removing the tissue.  Applying a bandage over the affected area after you apply the medicine.  Repeating the process daily or as told by your health care provider.  Do not scratch or pick at a wart.  Wash your hands after you touch a wart.  If a wart is painful, try applying a bandage with a hole in the middle over the wart. The helps to take pressure off the wart.  Keep all follow-up visits as told by your health care provider. This is important. PREVENTION   Take these actions to help prevent warts:  Wear shoes and socks. Change your socks daily.  Keep your feet clean and dry.  Check your feet regularly.  Avoid direct contact with warts on other people. SEEK MEDICAL CARE IF:  Your warts do not improve after treatment.  You have redness, swelling, or pain at the site of a wart.  You have bleeding from a wart that does not stop with light pressure.  You have diabetes and you develop a wart.   This information is not intended to replace advice given to you by your health care provider. Make sure  you discuss any questions you have with your health care provider.   Document Released: 04/05/2003 Document Revised: 10/04/2014 Document Reviewed: 04/10/2014 Elsevier Interactive Patient Education 2016 Elsevier Inc.  

## 2015-10-24 NOTE — Progress Notes (Signed)
   Subjective:   Kimberly Guerrero is a 65 y.o. female with a history of Depression, osteoarthritis here for sore on left foot  Sore to touch on bottom of L foot No ulcer or wound No known injury Worse in shoes without support Present about 2 weeks Never had anything like this before  Review of Systems:  Per HPI.   Social History: Never smoker  Objective:  BP 117/70   Pulse (!) 55   Wt 162 lb (73.5 kg)   BMI 23.92 kg/m   Gen:  65 y.o. female in NAD HEENT: NCAT, MMM CV: RRR, no MRG Resp: Non-labored, CTAB, no wheezes noted Ext: WWP, no edema MSK: plantar wart on the plantar surface of left foot, TTP Neuro: Alert and oriented, speech normal    Assessment & Plan:     Kimberly Guerrero is a 65 y.o. female here for plantar wart  Plantar wart of left foot Plantar wart removed on bottom of left foot with cryotherapy and scalpel scraping Follow-up in 2 months with possible need to retreat at that time      Virginia Crews, MD MPH PGY-3,  [facility] 10/24/2015  2:27 PM

## 2015-10-24 NOTE — Progress Notes (Deleted)
   Subjective:   Kimberly Guerrero is a 65 y.o. female with a history of depression, OA she here for ***  ***  Left foot sore - present for *** - ***  Review of Systems:  Per HPI.   Social History: never smoker  Objective:  There were no vitals taken for this visit.  Gen:  65 y.o. female in NAD *** HEENT: NCAT, MMM, EOMI, PERRL, anicteric sclerae CV: RRR, no MRG, no JVD Resp: Non-labored, CTAB, no wheezes noted Abd: Soft, NTND, BS present, no guarding or organomegaly Ext: WWP, no edema MSK: Full ROM, strength intact Neuro: Alert and oriented, speech normal      Chemistry      Component Value Date/Time   NA 136 02/27/2015 0957   K 4.4 02/27/2015 0957   CL 100 02/27/2015 0957   CO2 26 02/27/2015 0957   BUN 23 02/27/2015 0957   CREATININE 1.03 (H) 02/27/2015 0957      Component Value Date/Time   CALCIUM 10.0 02/27/2015 0957   ALKPHOS 92 02/27/2015 0957   AST 16 02/27/2015 0957   ALT 10 02/27/2015 0957   BILITOT 0.3 02/27/2015 0957      Lab Results  Component Value Date   WBC 6.0 02/27/2015   HGB 12.4 02/27/2015   HCT 38.6 02/27/2015   MCV 82.1 02/27/2015   PLT 207 02/27/2015   Lab Results  Component Value Date   TSH 0.688 12/29/2009   Lab Results  Component Value Date   HGBA1C 6.0 02/27/2015   Assessment & Plan:     Kimberly Guerrero is a 65 y.o. female here for ***  No problem-specific Assessment & Plan notes found for this encounter.      Virginia Crews, MD MPH PGY-3,  [facility] 10/24/2015  1:26 PM

## 2015-10-24 NOTE — Assessment & Plan Note (Signed)
Plantar wart removed on bottom of left foot with cryotherapy and scalpel scraping Follow-up in 2 months with possible need to retreat at that time

## 2015-11-07 ENCOUNTER — Ambulatory Visit (INDEPENDENT_AMBULATORY_CARE_PROVIDER_SITE_OTHER): Payer: PPO | Admitting: Student

## 2015-11-07 ENCOUNTER — Encounter: Payer: Self-pay | Admitting: Student

## 2015-11-07 ENCOUNTER — Ambulatory Visit: Payer: No Typology Code available for payment source | Admitting: Student

## 2015-11-07 ENCOUNTER — Ambulatory Visit: Payer: No Typology Code available for payment source | Admitting: Family Medicine

## 2015-11-07 DIAGNOSIS — B070 Plantar wart: Secondary | ICD-10-CM

## 2015-11-07 NOTE — Patient Instructions (Addendum)
Human Papillomavirus Human papillomavirus (HPV) is the most common sexually transmitted infection (STI) and is highly contagious. HPV infections cause genital warts and cancers to the outlet of the womb (cervix), birth canal (vagina), opening of the birth canal (vulva), and anus. There are over 100 types of HPV. Unless wartlike lesions are present in the throat or there are genital warts that you can see or feel, HPV usually does not cause symptoms. It is possible to be infected for long periods and pass it on to others without knowing it. CAUSES  HPV is spread from person to person through sexual contact. This includes oral, vaginal, or anal sex. RISK FACTORS  Having unprotected sex. HPV can be spread by oral, vaginal, or anal sex.  Having several sex partners.  Having a sex partner who has other sex partners.  Having or having had another sexually transmitted infection. SIGNS AND SYMPTOMS  Most people carrying HPV do not have any symptoms. If symptoms are present, symptoms may include:  Wartlike lesions in the throat (from having oral sex).  Warts in the infected skin or mucous membranes.  Genital warts that may itch, burn, or bleed.  Genital warts that may be painful or bleed during sexual intercourse. DIAGNOSIS  If wartlike lesions are present in the throat or genital warts are present, your health care provider can usually diagnose HPV by physical examination.   Genital warts are easily seen with the naked eye.  Currently, there is no FDA-approved test to detect HPV in males.  In females, a Pap test can show cells that are infected with HPV.  In females, a scope can be used to view the cervix (colposcopy). A colposcopy can be performed if the pelvic exam or Pap test is abnormal. A sample of tissue may be removed (biopsy) during the colposcopy. TREATMENT  There is no treatment for the virus itself. However, there are treatments for the health problems and symptoms HPV can cause.  Your health care provider will follow you closely after you are treated. This is because the HPV can come back and may need treatment again. Treatment of HPV may include:   Medicines, which may be injected or applied in a cream, lotion, or gel form.  Use of a probe to apply extreme cold (cryotherapy).  Application of an intense beam of light (laser treatment).  Use of a probe to apply extreme heat (electrocautery).  Surgery. HOME CARE INSTRUCTIONS   Take medicines only as directed by your health care provider.  Use over-the-counter creams for itching or irritation as directed by your health care provider.  Keep all follow-up visits as directed by your health care provider. This is important.  Do not touch or scratch the warts.  Do not treat genital warts with medicines used for treating hand warts.  Do not have sex while you are being treated.  Do not douche or use tampons during treatment of HPV.  Tell your sex partner about your infection because he or she may also need treatment.  If you become pregnant, tell your health care provider that you have had HPV. Your health care provider will monitor you closely during pregnancy to be sure your baby is safe.  After treatment, use condoms during sex to prevent future infections.  Have only one sex partner.  Have a sex partner who does not have other sex partners. PREVENTION   Talk to your health care provider about getting the HPV vaccines. These vaccines prevent some HPV infections and cancers.  It is recommended that the vaccine be given to males and females between the ages of 54 and 79 years old. It will not work if you already have HPV, and it is not recommended for pregnant women.  A Pap test is done to screen for cervical cancer in women.  The first Pap test should be done at age 84 years.  Between ages 74 and 71 years, Pap tests are repeated every 2 years.  Beginning at age 42, you are advised to have a Pap test every  3 years as long as your past 3 Pap tests have been normal.  Some women have medical problems that increase the chance of getting cervical cancer. Talk to your health care provider about these problems. It is especially important to talk to your health care provider if a new problem develops soon after your last Pap test. In these cases, your health care provider may recommend more frequent screening and Pap tests.  The above recommendations are the same for women who have or have not gotten the vaccine for HPV.  If you had a hysterectomy for a problem that was not a cancer or a condition that could lead to cancer, then you no longer need Pap tests. However, even if you no longer need a Pap test, a regular exam is a good idea to make sure no other problems are starting.   If you are between the ages of 53 and 37 years and you have had normal Pap tests going back 10 years, you no longer need Pap tests. However, even if you no longer need a Pap test, a regular exam is a good idea to make sure no other problems are starting.  If you have had past treatment for cervical cancer or a condition that could lead to cancer, you need Pap tests and screening for cancer for at least 20 years after your treatment.  If Pap tests have been discontinued, risk factors (such as a new sexual partner)need to be reassessed to determine if screening should be resumed.  Some women may need screenings more often if they are at high risk for cervical cancer. SEEK MEDICAL CARE IF:   The treated skin becomes red, swollen, or painful.  You have a fever.  You feel generally ill.  You feel lumps or pimple-like projections in and around your genital area.  You develop bleeding of the vagina or the treatment area.  You have painful sexual intercourse. MAKE SURE YOU:   Understand these instructions.  Will watch your condition.  Will get help if you are not doing well or get worse.   This information is not  intended to replace advice given to you by your health care provider. Make sure you discuss any questions you have with your health care provider.   Document Released: 04/05/2003 Document Revised: 02/03/2014 Document Reviewed: 04/20/2013 Elsevier Interactive Patient Education 2016 Elsevier Inc.   Varicose Veins Varicose veins are veins that have become enlarged and twisted. They are usually seen in the legs but can occur in other parts of the body as well. CAUSES This condition is the result of valves in the veins not working properly. Valves in the veins help to return blood from the leg to the heart. If these valves are damaged, blood flows backward and backs up into the veins in the leg near the skin. This causes the veins to become larger. RISK FACTORS People who are on their feet a lot, who are pregnant, or  who are overweight are more likely to develop varicose veins. SIGNS AND SYMPTOMS Bulging, twisted-appearing, bluish veins, most commonly found on the legs. Leg pain or a feeling of heaviness. These symptoms may be worse at the end of the day. Leg swelling. Changes in skin color. DIAGNOSIS A health care provider can usually diagnose varicose veins by examining your legs. Your health care provider may also recommend an ultrasound of your leg veins. TREATMENT Most varicose veins can be treated at home.However, other treatments are available for people who have persistent symptoms or want to improve the cosmetic appearance of the varicose veins. These treatment options include: Sclerotherapy. A solution is injected into the vein to close it off. Laser treatment. A laser is used to heat the vein to close it off. Radiofrequency vein ablation. An electrical current produced by radio waves is used to close off the vein. Phlebectomy. The vein is surgically removed through small incisions made over the varicose vein. Vein ligation and stripping. The vein is surgically removed through  incisions made over the varicose vein after the vein has been tied (ligated). HOME CARE INSTRUCTIONS Do not stand or sit in one position for long periods of time. Do not sit with your legs crossed. Rest with your legs raised during the day. Wear compression stockings as directed by your health care provider. These stockings help to prevent blood clots and reduce swelling in your legs. Do not wear other tight, encircling garments around your legs, pelvis, or waist. Walk as much as possible to increase blood flow. Raise the foot of your bed at night with 2-inch blocks. If you get a cut in the skin over the vein and the vein bleeds, lie down with your leg raised and press on it with a clean cloth until the bleeding stops. Then place a bandage (dressing) on the cut. See your health care provider if it continues to bleed. SEEK MEDICAL CARE IF: The skin around your ankle starts to break down. You have pain, redness, tenderness, or hard swelling in your leg over a vein. You are uncomfortable because of leg pain.   This information is not intended to replace advice given to you by your health care provider. Make sure you discuss any questions you have with your health care provider.   Document Released: 10/23/2004 Document Revised: 02/03/2014 Document Reviewed: 05/31/2013 Elsevier Interactive Patient Education [employer].

## 2015-11-07 NOTE — Assessment & Plan Note (Signed)
Written consent was obtained. The overlying skin was cleaned by alcohol swab. See hyper-keratinized skin was scraped using scalpel. Three cycles of cryotherapy done at 2 points. Each cycle lasted as long as patient tolerates which is about 20-30 minutes. Patient tolerated the procedure well. Return precautions was discussed

## 2015-11-07 NOTE — Progress Notes (Signed)
   Subjective:    Patient ID: Kimberly Guerrero is a 65 y.o. old female.  HPI #Wart on her left foot: First noted about 6 weeks ago. Had cryotherapy and scalpel scrapping about two weeks ago. She denies injury or trauma. She says it is painful when she walks on it.   PMH: reviewed  SH: Denies smoking  Review of Systems Per HPI Objective:   Vitals:   11/07/15 0947  BP: 130/72  Pulse: 65  Temp: 98.7 F (37.1 C)  TempSrc: Oral  SpO2: 92%  Weight: 161 lb 6.4 oz (73.2 kg)  Height: 5\' 9"  (1.753 m)    GEN: appears well, no apparent distress. CVS: RRR, normal s1 and s2 RESP: no increased work of breathing MSK: Areas of hyper-keratinized skin over the plantar aspect of left foot. This tenderness to palpation. No skin changes. She also have bilateral varicose pain and swelling around her ankles. Full range of motion in the ankles. No tenderness to palpation. No skin erythema. No increased warmth.  NEURO: alert and oriented appropriately, no gross defecits     Assessment & Plan:  Plantar wart of left foot Written consent was obtained. The overlying skin was cleaned by alcohol swab. See hyper-keratinized skin was scraped using scalpel. Three cycles of cryotherapy done at 2 points. Each cycle lasted as long as patient tolerates which is about 20-30 minutes. Patient tolerated the procedure well. Return precautions was discussed

## 2015-12-14 ENCOUNTER — Ambulatory Visit (INDEPENDENT_AMBULATORY_CARE_PROVIDER_SITE_OTHER): Payer: Medicare Other | Admitting: Family Medicine

## 2015-12-14 ENCOUNTER — Encounter: Payer: Self-pay | Admitting: Family Medicine

## 2015-12-14 ENCOUNTER — Ambulatory Visit
Admission: RE | Admit: 2015-12-14 | Discharge: 2015-12-14 | Disposition: A | Discharge status: Home / Self Care | Payer: Medicare Other | Source: Ambulatory Visit | Attending: Family Medicine | Admitting: Family Medicine

## 2015-12-14 VITALS — BP 121/58 | HR 50 | Ht 69.0 in | Wt 161.0 lb

## 2015-12-14 DIAGNOSIS — M4125 Other idiopathic scoliosis, thoracolumbar region: Secondary | ICD-10-CM

## 2015-12-14 DIAGNOSIS — B070 Plantar wart: Secondary | ICD-10-CM

## 2015-12-14 DIAGNOSIS — M545 Low back pain, unspecified: Secondary | ICD-10-CM

## 2015-12-14 DIAGNOSIS — G8929 Other chronic pain: Secondary | ICD-10-CM

## 2015-12-14 MED ORDER — METHYLPREDNISOLONE ACETATE 40 MG/ML IJ SUSP
40.0000 mg | Freq: Once | INTRAMUSCULAR | Status: AC
  Administered 2015-12-14: 40 mg via INTRA_ARTICULAR

## 2015-12-14 NOTE — Progress Notes (Signed)
  Kimberly Guerrero - 65 y.o. female MRN [id number]  Date of birth: 31-Jan-1950  CC: Chronic back pain f/u  SUBJECTIVE:  Kimberly Guerrero is a 65 yo female presenting with right sided chronic back pain. She was last seen here in 2014 with similar symptoms and received a trigger point injection. This helped for a month before her pain returned. She has tried lidocaine patches, advil, heating pads, and has completed a course of PT without any relief.  Pain is made worse with bending. Pain does not radiate. No weakness/numbness, nighttime sx, bowel/bladder sx.  She also reports a painful lesion on her left foot. It has been shaved and treated with liquid nitrogen twice in the past with short term relief (~1 week). She reports the second application of liquid nitro left her in pain for several days.   ROS: No weight loss, sweats, or chills Per HPI  PHYSICAL EXAM:  VS: BP:(!) 121/58  HR:(!) 50bpm  HT:5\' 9"  (175.3 cm)   WT:161 lb (73 kg)  BMI:23.8  Well appearing female in no acute distress.   Back:  No bony deformities. Nontender to palpation along spinous processes. Nontender along paraspinous or hip musculature.  Full active ROM without pain FABER negative SLR negative  Neuro: DTR 2+ bilateral LE. 5/5 strength bilateral hip and knee flexion and extension. Sensation to light touch intact bilaterally.   L foot: 1cm singular flat, skin-colored hyperkeratotic papule with overlying callus at base of 2nd and 3rd toe  ASSESSMENT & PLAN:  1. Low back pain: no radicular signs/symptoms. Negative provocative testing. - XR lumbar spine 4 view - MRI lumbar spine - Trigger point injection today  - Will call with results and plan going forward  2. Plantar wart of left foot: previously seen by PCP and had shaved x2. Reports painful reaction to liquid nitro lasting several days - Will see in procedures clinic - Alternative to liquid nitro (imiquimod, etc.)

## 2015-12-14 NOTE — Patient Instructions (Addendum)
Threasa Beards See if you can schedule her in [facility] clinic on [address] at [facility] Dx plantar wart

## 2015-12-17 ENCOUNTER — Telehealth: Payer: Self-pay | Admitting: *Deleted

## 2015-12-17 NOTE — Telephone Encounter (Signed)
Is there something you would like me to tell this patient about her xray? She is scheduled for the MRI on Nov 26

## 2015-12-17 NOTE — Progress Notes (Signed)
[place] Attending Note: I have seen and examined this patient with the medical student. I have  reviewed the history, physical examination, assessment and plan as documented in the medical student's note.  I agree with the medical student's note and findings, assessment and treatment plan as documented with the following additions or changes: 1. MSK low back pain--some underlying scoliosis with DJD, mild anterolisthesis. I think she would likely benefit from epidural steroid injections. Will get XRAYS and MRI to set her up for probably ESI. Today I gave her a trigger point injection in low back muscles. 2. Plantar wart--I will see at [facility] in procedure clinic  INJECTION: Patient was given informed consent, signed copy in the chart. Appropriate time out was taken. Area prepped and draped in usual sterile fashion. 1 cc of methylprednisolone 40 mg/ml plus  3 cc of 1% lidocaine without epinephrine was injected into the iliocostalis muscle group using a(n) perpendicular approach. The patient tolerated the procedure well. There were no complications. Post procedure instructions were given.

## 2015-12-17 NOTE — Telephone Encounter (Signed)
-----   Message from Carolyne Littles sent at 12/17/2015 11:45 AM EST ----- Regarding: results Contact: [phone] Pt is looking for back xray results.

## 2015-12-23 ENCOUNTER — Ambulatory Visit
Admission: RE | Admit: 2015-12-23 | Discharge: 2015-12-23 | Disposition: A | Discharge status: Home / Self Care | Payer: Medicare Other | Source: Ambulatory Visit | Attending: Family Medicine | Admitting: Family Medicine

## 2015-12-23 DIAGNOSIS — M545 Low back pain: Principal | ICD-10-CM

## 2015-12-23 DIAGNOSIS — M4125 Other idiopathic scoliosis, thoracolumbar region: Secondary | ICD-10-CM

## 2015-12-23 DIAGNOSIS — M5126 Other intervertebral disc displacement, lumbar region: Secondary | ICD-10-CM | POA: Diagnosis not present

## 2015-12-23 DIAGNOSIS — G8929 Other chronic pain: Secondary | ICD-10-CM

## 2015-12-24 NOTE — Telephone Encounter (Signed)
Have you reviewed her xray yet?  She called again asking for results

## 2015-12-24 NOTE — Telephone Encounter (Signed)
Her mri was yesterday, she is looking for those results. Thanks!

## 2015-12-25 ENCOUNTER — Other Ambulatory Visit: Payer: Self-pay | Admitting: *Deleted

## 2015-12-25 NOTE — Telephone Encounter (Signed)
Spoke with the patient about her results and she is ok with [place] sending her for a lumbar ESI.  I will set this up at [place].

## 2015-12-25 NOTE — Telephone Encounter (Signed)
Rhea Please let her know the MRI showed a LOT of arthritic change as we expected. She might very well benefit from epidural steroid injection--I had originally mentioned this to her---reason we got MRI--if she has questions let me know but otherwise see if she wants you to set that up [facility]! Dorcas Mcmurray

## 2015-12-27 ENCOUNTER — Ambulatory Visit (INDEPENDENT_AMBULATORY_CARE_PROVIDER_SITE_OTHER): Payer: Medicare Other | Admitting: Family Medicine

## 2015-12-27 VITALS — BP 132/65 | HR 57 | Temp 98.2°F | Ht 69.0 in | Wt 160.0 lb

## 2015-12-27 DIAGNOSIS — B070 Plantar wart: Secondary | ICD-10-CM | POA: Diagnosis not present

## 2015-12-27 NOTE — Progress Notes (Signed)
Plantar wart that is painful, longstanding, LEFT foot. Patient given informed consent, signed copy in the chart. Appropriate time out taken. Area cleaned with betadine and alcohol. Ethyl chloride spray used initially for some anesthesia as patient was extremely nervous. Large callous surronding plantar wart on the MT head area of left foot was debrided with a #11 scalpel blade. No bleeding and no pain. Once significant amount of callous was removed, a wet silver nitrate stick was applied to the area.   I recommend she come back in 3-4 weeks for a second treatment. This will likely require 3 or more treatments as she is very nervous and I debrided minimally (no bleeding). She does not want to use liquid nitrogen as she had large blister formation in past.

## 2016-01-01 DIAGNOSIS — H5203 Hypermetropia, bilateral: Secondary | ICD-10-CM | POA: Diagnosis not present

## 2016-02-07 ENCOUNTER — Ambulatory Visit (INDEPENDENT_AMBULATORY_CARE_PROVIDER_SITE_OTHER): Payer: Medicare Other | Admitting: Family Medicine

## 2016-02-07 ENCOUNTER — Other Ambulatory Visit: Payer: Self-pay | Admitting: Family Medicine

## 2016-02-07 ENCOUNTER — Encounter: Payer: Self-pay | Admitting: Family Medicine

## 2016-02-07 VITALS — BP 128/62 | HR 53 | Temp 98.4°F | Ht 69.0 in | Wt 165.2 lb

## 2016-02-07 DIAGNOSIS — E878 Other disorders of electrolyte and fluid balance, not elsewhere classified: Secondary | ICD-10-CM | POA: Diagnosis not present

## 2016-02-07 MED ORDER — IBUPROFEN 800 MG PO TABS: 800.0000 mg | ORAL_TABLET | Freq: Three times a day (TID) | ORAL | 0 refills | Status: DC | PRN

## 2016-02-07 MED ORDER — IBUPROFEN 800 MG PO TABS: 800.0000 mg | ORAL_TABLET | Freq: Three times a day (TID) | ORAL | 0 refills | Status: AC | PRN

## 2016-02-08 NOTE — Progress Notes (Signed)
Wart is resolved I debrided small amount of callous rtc prn  Re her back pain--will check w my nurse as she never heard back from [facility] re possible ESI

## 2016-02-19 ENCOUNTER — Encounter: Payer: Self-pay | Admitting: Family Medicine

## 2016-02-22 ENCOUNTER — Telehealth: Payer: Self-pay | Admitting: Family Medicine

## 2016-02-22 ENCOUNTER — Other Ambulatory Visit: Payer: Self-pay | Admitting: Family Medicine

## 2016-02-22 DIAGNOSIS — J209 Acute bronchitis, unspecified: Secondary | ICD-10-CM

## 2016-02-22 MED ORDER — FLUTICASONE PROPIONATE 50 MCG/ACT NA SUSP: 2.0000 | Freq: Every day | NASAL | 11 refills | Status: DC | PRN

## 2016-02-22 NOTE — Telephone Encounter (Signed)
Pt would like someone to call her about lab results. ep

## 2016-02-22 NOTE — Telephone Encounter (Signed)
Pt needs a refill on Flonase. Pt uses [facility] on [address]. ep

## 2016-02-22 NOTE — Telephone Encounter (Signed)
Notes from Dr Nori Riis who ordered the labs:  Notes Recorded by Dickie La, MD on 02/21/2016 at 11:51 AM EST Creatinine normal. Calcium elevation insignificant / incidental finding.  Please relay to patient.  Thanks!

## 2016-02-26 NOTE — Telephone Encounter (Signed)
LMOVM for pt to call us back. Enora Trillo, CMA  

## 2016-02-27 NOTE — Telephone Encounter (Signed)
Pt called back and stated not to call her after all. ep

## 2016-02-27 NOTE — Telephone Encounter (Signed)
Message given to pt but she wants it explained. pleaese call her

## 2016-03-24 ENCOUNTER — Encounter: Payer: Medicare Other | Admitting: Family Medicine

## 2016-03-27 LAB — BASIC METABOLIC PANEL
BUN / CREAT RATIO: 23 (ref 12–28)
BUN: 22 mg/dL (ref 8–27)
CO2: 21 mmol/L (ref 18–29)
Calcium: 10.4 mg/dL — ABNORMAL HIGH (ref 8.7–10.3)
Chloride: 102 mmol/L (ref 96–106)
Creatinine, Ser: 0.94 mg/dL (ref 0.57–1.00)
GFR calc Af Amer: 74 mL/min/{1.73_m2} (ref >59)
GFR, EST NON AFRICAN AMERICAN: 64 mL/min/{1.73_m2} (ref >59)
GLUCOSE: 97 mg/dL (ref 65–99)
POTASSIUM: 4.7 mmol/L (ref 3.5–5.2)
Sodium: 145 mmol/L — ABNORMAL HIGH (ref 134–144)

## 2016-04-07 ENCOUNTER — Encounter: Payer: Medicare Other | Admitting: Family Medicine

## 2016-04-22 ENCOUNTER — Encounter: Payer: Self-pay | Admitting: Family Medicine

## 2016-04-22 ENCOUNTER — Ambulatory Visit (INDEPENDENT_AMBULATORY_CARE_PROVIDER_SITE_OTHER): Payer: Medicare Other | Admitting: Family Medicine

## 2016-04-22 VITALS — BP 106/60 | HR 62 | Temp 98.6°F | Ht 69.0 in | Wt 155.0 lb

## 2016-04-22 DIAGNOSIS — R5382 Chronic fatigue, unspecified: Secondary | ICD-10-CM | POA: Diagnosis not present

## 2016-04-22 DIAGNOSIS — Z0000 Encounter for general adult medical examination without abnormal findings: Secondary | ICD-10-CM

## 2016-04-22 DIAGNOSIS — E785 Hyperlipidemia, unspecified: Secondary | ICD-10-CM | POA: Diagnosis not present

## 2016-04-22 DIAGNOSIS — R7303 Prediabetes: Secondary | ICD-10-CM | POA: Diagnosis not present

## 2016-04-22 DIAGNOSIS — Z23 Encounter for immunization: Secondary | ICD-10-CM

## 2016-04-22 LAB — POCT GLYCOSYLATED HEMOGLOBIN (HGB A1C): HEMOGLOBIN A1C: 6.2

## 2016-04-22 NOTE — Assessment & Plan Note (Signed)
Doing well Healthy diet and regular exercise - urge patient to continue this Up-to-date on immunizations and cancer screening with the exception of FOBT cards to be given today for colon cancer screening, DEXA for bone density screening ordered, 13 valent pneumococcal vaccine given today Will need 23 valent pneumococcal vaccine next year Screening A1c, lipid panel, CMP, CBC PHQ 2 negative Information given about living wills/POA Annual CPE

## 2016-04-22 NOTE — Patient Instructions (Signed)
Nice to see you again today. We are giving you some information about a living will and power of attorney.  We are getting some labs today and someone will call you or send you a letter with the results when they're available.  We are giving you the stool cards to complete again this year. These will be done if the only unless he decided to do a colonoscopy.  We will schedule a bone density scan to screen for osteoporosis.  Your getting a pneumonia shot today. You will get another one of these in one year and then you'll be done with them.  Next wellness checkup in 1 year.  Take care, Dr. B    Things to do to keep yourself healthy  - Exercise at least 30-45 minutes a day, 3-4 days a week.  - Eat a low-fat diet with lots of fruits and vegetables, up to 7-9 servings per day.  - Seatbelts can save your life. Wear them always.  - Smoke detectors on every level of your home, check batteries every year.  - Eye Doctor - have an eye exam every 1-2 years  - Safe sex - if you may be exposed to STDs, use a condom.  - Alcohol -  If you drink, do it moderately, less than 2 drinks per day.  - [place]. Choose someone to speak for you if you are not able.  - Depression is common in our stressful world.If you're feeling down or losing interest in things you normally enjoy, please come in for a visit.  - Violence - If anyone is threatening or hurting you, please call immediately.

## 2016-04-22 NOTE — Progress Notes (Signed)
   Subjective:   Kimberly Guerrero is a 66 y.o. female with a history of Depression, chronic fatigue, Plantar warts here for well woman/preventative visit  Acute Concerns: none   Diet: mostly veggies, some chicken/fish, not a lot of carbs  Exercise: aerobics 3-4 times weekly  Sexual/Birth History: G1P1001, not currently sexually active   Birth Control: Postmenopausal  POA/Living Will: none   Review of Systems: Per HPI.    PMH, PSH, Medications, Allergies, and FmHx reviewed and updated in EMR.  Social History: Never smoker  Immunization:  Tdap/TD: UTD  Influenza: 09/2015  Pneumococcal: needs 13 valent today and 23 valent in 1 yr  Herpes Zoster: UTD  Cancer Screening:  Pap Smear: normal in 04/26/15, adequate screening, no further cervical cancer screening required  Mammogram: completed in 09/2015  Colonoscopy: stool cards annual - to repeat today  Dexa: needs   Objective:  BP 106/60 (BP Location: Right Arm, Patient Position: Sitting, Cuff Size: Normal)   Pulse 62   Temp 98.6 F (37 C) (Oral)   Ht 5\' 9"  (1.753 m)   Wt 155 lb (70.3 kg)   SpO2 98%   BMI 22.89 kg/m   Gen:  66 y.o. female in NAD HEENT: NCAT, MMM, EOMI, PERRL, anicteric sclerae CV: RRR, no MRG Resp: Non-labored, CTAB, no wheezes noted Abd: Soft, NTND, BS present, no guarding or organomegaly Ext: WWP, no edema MSK: No obvious deformities, gait intact Neuro: Alert and oriented, speech normal        Chemistry      Component Value Date/Time   NA 145 (H) 02/07/2016 0000   K 4.7 02/07/2016 0000   CL 102 02/07/2016 0000   CO2 21 02/07/2016 0000   BUN 22 02/07/2016 0000   CREATININE 0.94 02/07/2016 0000   CREATININE 1.03 (H) 02/27/2015 0957      Component Value Date/Time   CALCIUM 10.4 (H) 02/07/2016 0000   ALKPHOS 92 02/27/2015 0957   AST 16 02/27/2015 0957   ALT 10 02/27/2015 0957   BILITOT 0.3 02/27/2015 0957      Lab Results  Component Value Date   WBC 6.0 02/27/2015   HGB 12.4 02/27/2015    HCT 38.6 02/27/2015   MCV 82.1 02/27/2015   PLT 207 02/27/2015   Lab Results  Component Value Date   TSH 0.688 12/29/2009   Lab Results  Component Value Date   HGBA1C 6.0 02/27/2015    Assessment & Plan:     Kimberly Guerrero is a 66 y.o. female here for annual well woman/preventative exam.  Preventative health care Doing well Healthy diet and regular exercise - urge patient to continue this Up-to-date on immunizations and cancer screening with the exception of FOBT cards to be given today for colon cancer screening, DEXA for bone density screening ordered, 13 valent pneumococcal vaccine given today Will need 23 valent pneumococcal vaccine next year Screening A1c, lipid panel, CMP, CBC PHQ 2 negative Information given about living wills/POA Annual CPE    Virginia Crews, MD MPH PGY-3,  [facility] 04/22/2016  9:07 AM

## 2016-04-23 ENCOUNTER — Telehealth: Payer: Self-pay | Admitting: Family Medicine

## 2016-04-23 LAB — CMP14+EGFR
A/G RATIO: 1.8 (ref 1.2–2.2)
ALT: 11 IU/L (ref 0–32)
AST: 18 IU/L (ref 0–40)
Albumin: 4.5 g/dL (ref 3.6–4.8)
Alkaline Phosphatase: 115 IU/L (ref 39–117)
BUN/Creatinine Ratio: 21 (ref 12–28)
BUN: 19 mg/dL (ref 8–27)
Bilirubin Total: 0.2 mg/dL (ref 0.0–1.2)
CHLORIDE: 100 mmol/L (ref 96–106)
CO2: 26 mmol/L (ref 18–29)
Calcium: 10.1 mg/dL (ref 8.7–10.3)
Creatinine, Ser: 0.91 mg/dL (ref 0.57–1.00)
GFR calc non Af Amer: 66 mL/min/{1.73_m2} (ref >59)
GFR, EST AFRICAN AMERICAN: 77 mL/min/{1.73_m2} (ref >59)
GLOBULIN, TOTAL: 2.5 g/dL (ref 1.5–4.5)
Glucose: 88 mg/dL (ref 65–99)
Potassium: 4.6 mmol/L (ref 3.5–5.2)
SODIUM: 141 mmol/L (ref 134–144)
Total Protein: 7 g/dL (ref 6.0–8.5)

## 2016-04-23 LAB — CBC
HEMOGLOBIN: 12.4 g/dL (ref 11.1–15.9)
Hematocrit: 39.2 % (ref 34.0–46.6)
MCH: 26.2 pg — ABNORMAL LOW (ref 26.6–33.0)
MCHC: 31.6 g/dL (ref 31.5–35.7)
MCV: 83 fL (ref 79–97)
Platelets: 176 10*3/uL (ref 150–379)
RBC: 4.74 x10E6/uL (ref 3.77–5.28)
RDW: 14.2 % (ref 12.3–15.4)
WBC: 6.6 10*3/uL (ref 3.4–10.8)

## 2016-04-23 LAB — LIPID PANEL
Chol/HDL Ratio: 2.5 ratio units (ref 0.0–4.4)
Cholesterol, Total: 195 mg/dL (ref 100–199)
HDL: 78 mg/dL (ref >39)
LDL Calculated: 98 mg/dL (ref 0–99)
Triglycerides: 93 mg/dL (ref 0–149)
VLDL Cholesterol Cal: 19 mg/dL (ref 5–40)

## 2016-04-23 NOTE — Telephone Encounter (Signed)
Called patient to discuss lab results. A1c has been slowly rising from 6 to 6.2 which is in the prediabetes range. Need to discuss with patient about low carbohydrate diet.  Electrolytes, kidney function, liver function all within normal limits. Blood counts within normal limits. Lipid panel shows ASCVD 10 year risk is 6.1%. Patient will likely benefit from a moderate intensity statin such as Lipitor. It does not seem she has ever taken a statin and we should discuss this.  Patient was unavailable, voicemail was left asking patient to call back to the clinic. If she returns the call, please let me know when I could call her back to discuss, or schedule her an appointment in the next month to discuss possibility of taking a statin and diet control for her prediabetes.  Virginia Crews, MD, MPH PGY-3,  [facility] 04/23/2016 11:13 AM

## 2016-04-28 ENCOUNTER — Other Ambulatory Visit: Payer: Self-pay | Admitting: Family Medicine

## 2016-04-28 DIAGNOSIS — E2839 Other primary ovarian failure: Secondary | ICD-10-CM

## 2016-04-28 NOTE — Telephone Encounter (Signed)
Patient stopped by clinic today to discuss labs, patient informed of message from PCP and appointment scheduled for 5/1 but patient would still like for MD to give her a call to discuss.

## 2016-04-29 ENCOUNTER — Telehealth: Payer: Self-pay | Admitting: Family Medicine

## 2016-04-29 NOTE — Telephone Encounter (Signed)
Patient came to office request pcp send fax to [facility] for bone density test. Patient stated last one sent they said it needed to be reason code other than the preventative.  Appt. Can not be made until they receive this. Any questions patient may be reached at [phone]

## 2016-04-30 ENCOUNTER — Other Ambulatory Visit (INDEPENDENT_AMBULATORY_CARE_PROVIDER_SITE_OTHER): Payer: Medicare Other | Admitting: Family Medicine

## 2016-04-30 DIAGNOSIS — Z1211 Encounter for screening for malignant neoplasm of colon: Secondary | ICD-10-CM

## 2016-04-30 LAB — POC HEMOCCULT BLD/STL (HOME/3-CARD/SCREEN)
FECAL OCCULT BLD: NEGATIVE
FECAL OCCULT BLD: NEGATIVE
Fecal Occult Blood, POC: NEGATIVE

## 2016-04-30 NOTE — Addendum Note (Signed)
Addended by: [place] Pink on: 04/30/2016 11:13 AM   Modules accepted: Orders

## 2016-04-30 NOTE — Telephone Encounter (Signed)
New order was placed earlier this week.  Please attempt to schedule appt or let patient know she can schedule it.  Virginia Crews, MD, MPH PGY-3,  [facility] 04/30/2016 10:47 AM

## 2016-04-30 NOTE — Telephone Encounter (Signed)
Attempted to return patient call. No answer. Left VM asking to call back if she still wished to discuss.  Virginia Crews, MD, MPH PGY-3,  [facility] 04/30/2016 10:53 AM

## 2016-05-01 NOTE — Telephone Encounter (Signed)
Reported to patient. She will call to schedule DEXA.

## 2016-05-05 ENCOUNTER — Ambulatory Visit: Payer: Medicare Other | Admitting: Family Medicine

## 2016-05-05 NOTE — Progress Notes (Deleted)
   Subjective:   Kimberly Guerrero is a 66 y.o. female with a history of *** here for ***  ***  Pre-diabetes - Diet: mostly veggies, some chicken/fish, not a lot of carbs - Exercise: aerobics 3-4 times weekly - denies symptoms of hypoglycemia, polyuria, polydipsia, numbness extremities, foot ulcers/trauma ***  HLD - ASCVD 2yr risk 6.1% based on lipid panel 04/22/16 - has never taken a statin *** - diet/exercise: as above   Review of Systems:  Per HPI.   Social History: *** smoker  Objective:  There were no vitals taken for this visit.  Gen:  67 y.o. female in NAD *** HEENT: NCAT, MMM, EOMI, PERRL, anicteric sclerae CV: RRR, no MRG, no JVD Resp: Non-labored, CTAB, no wheezes noted Abd: Soft, NTND, BS present, no guarding or organomegaly Ext: WWP, no edema MSK: Full ROM, strength intact Neuro: Alert and oriented, speech normal       Chemistry      Component Value Date/Time   NA 141 04/22/2016 0907   K 4.6 04/22/2016 0907   CL 100 04/22/2016 0907   CO2 26 04/22/2016 0907   BUN 19 04/22/2016 0907   CREATININE 0.91 04/22/2016 0907   CREATININE 1.03 (H) 02/27/2015 0957      Component Value Date/Time   CALCIUM 10.1 04/22/2016 0907   ALKPHOS 115 04/22/2016 0907   AST 18 04/22/2016 0907   ALT 11 04/22/2016 0907   BILITOT 0.2 04/22/2016 0907      Lab Results  Component Value Date   WBC 6.6 04/22/2016   HGB 12.4 02/27/2015   HCT 39.2 04/22/2016   MCV 83 04/22/2016   PLT 176 04/22/2016   Lab Results  Component Value Date   TSH 0.688 12/29/2009   Lab Results  Component Value Date   HGBA1C 6.2 04/22/2016   Assessment & Plan:     Kimberly Guerrero is a 66 y.o. female here for ***  No problem-specific Assessment & Plan notes found for this encounter.     Virginia Crews, MD MPH PGY-3,  [facility] 05/05/2016  9:41 AM

## 2016-05-12 ENCOUNTER — Ambulatory Visit
Admission: RE | Admit: 2016-05-12 | Discharge: 2016-05-12 | Disposition: A | Discharge status: Home / Self Care | Payer: Medicare Other | Source: Ambulatory Visit | Attending: Family Medicine | Admitting: Family Medicine

## 2016-05-12 DIAGNOSIS — E2839 Other primary ovarian failure: Secondary | ICD-10-CM

## 2016-05-12 DIAGNOSIS — M8589 Other specified disorders of bone density and structure, multiple sites: Secondary | ICD-10-CM | POA: Diagnosis not present

## 2016-05-12 DIAGNOSIS — Z780 Asymptomatic menopausal state: Secondary | ICD-10-CM | POA: Diagnosis not present

## 2016-05-27 ENCOUNTER — Encounter: Payer: Self-pay | Admitting: Family Medicine

## 2016-05-27 ENCOUNTER — Ambulatory Visit (INDEPENDENT_AMBULATORY_CARE_PROVIDER_SITE_OTHER): Payer: Medicare Other | Admitting: Family Medicine

## 2016-05-27 VITALS — BP 99/60 | HR 57 | Temp 98.1°F | Ht 69.0 in | Wt 155.0 lb

## 2016-05-27 DIAGNOSIS — M85859 Other specified disorders of bone density and structure, unspecified thigh: Secondary | ICD-10-CM | POA: Diagnosis not present

## 2016-05-27 DIAGNOSIS — R7303 Prediabetes: Secondary | ICD-10-CM | POA: Diagnosis not present

## 2016-05-27 DIAGNOSIS — E785 Hyperlipidemia, unspecified: Secondary | ICD-10-CM | POA: Insufficient documentation

## 2016-05-27 DIAGNOSIS — M8580 Other specified disorders of bone density and structure, unspecified site: Secondary | ICD-10-CM | POA: Insufficient documentation

## 2016-05-27 DIAGNOSIS — E7800 Pure hypercholesterolemia, unspecified: Secondary | ICD-10-CM | POA: Diagnosis not present

## 2016-05-27 MED ORDER — ATORVASTATIN CALCIUM 40 MG PO TABS: 40.0000 mg | ORAL_TABLET | Freq: Every day | ORAL | 3 refills | Status: DC

## 2016-05-27 NOTE — Patient Instructions (Signed)
Nice to see you again today. You have prediabetes. Watch your carbohydrate intake. Your overall risk is elevated, though your cholesterol looks okay. We will start a cholesterol pill daily. Also try taking a baby aspirin daily. Your bone density scan shows osteopenia, which is not normal or osteoporosis, but on the way to osteoporosis. Continue take your calcium and vitamin D supplements.  We'll follow-up in 6 months.  Take care, Dr. Jacinto Reap

## 2016-05-27 NOTE — Assessment & Plan Note (Signed)
Continue calcium and vitamin D supplementation and increase to twice daily Plan to repeat DEXA scan in 2 years

## 2016-05-27 NOTE — Assessment & Plan Note (Signed)
A1c rising from 6.0-6.2 Discussed diet and exercise lifestyle modifications Long discussion about carbohydrates and which foods contain them Follow-up in 6 months

## 2016-05-27 NOTE — Progress Notes (Signed)
   Subjective:   Kimberly Guerrero is a 66 y.o. female with a history of Allergic rhinitis, OA, postmenopausal here for prediabetes and hyperlipidemia follow-up  Last seen 04/22/16 for CPE.  Labs revealed: -  A1c has been slowly rising from 6 to 6.2 which is in the prediabetes range.  - CMP, CBC wnl - Lipid panel shows ASCVD 10 year risk is 6.1%  Patient has never taken a statin previously Diet: has been working on it - trying sugar free deserts, avoiding bread, does eat potatoes, not much meat Exercise: walking, aerobics 3 days per week  DEXA scan 05/12/16 shows T score -1.5 at L femoral neck -> Osteopenia - patient just started taking Ca and Vit D supplementation - does not get much Calcium in diet  Review of Systems:  Per HPI.   Social History: never smoker  Objective:  BP 99/60 (BP Location: Left Arm, Patient Position: Sitting, Cuff Size: Normal)   Pulse (!) 57   Temp 98.1 F (36.7 C) (Oral)   Ht 5\' 9"  (1.753 m)   Wt 155 lb (70.3 kg)   SpO2 98%   BMI 22.89 kg/m   Gen:  66 y.o. female in NAD HEENT: NCAT, MMM, anicteric sclerae CV: RRR, no MRG Resp: Non-labored, CTAB, no wheezes noted Ext: WWP, no edema MSK: No obvious deformities, gait intact Neuro: Alert and oriented, speech normal       Chemistry      Component Value Date/Time   NA 141 04/22/2016 0907   K 4.6 04/22/2016 0907   CL 100 04/22/2016 0907   CO2 26 04/22/2016 0907   BUN 19 04/22/2016 0907   CREATININE 0.91 04/22/2016 0907   CREATININE 1.03 (H) 02/27/2015 0957      Component Value Date/Time   CALCIUM 10.1 04/22/2016 0907   ALKPHOS 115 04/22/2016 0907   AST 18 04/22/2016 0907   ALT 11 04/22/2016 0907   BILITOT 0.2 04/22/2016 0907      Lab Results  Component Value Date   WBC 6.6 04/22/2016   HGB 12.4 02/27/2015   HCT 39.2 04/22/2016   MCV 83 04/22/2016   PLT 176 04/22/2016   Lab Results  Component Value Date   TSH 0.688 12/29/2009   Lab Results  Component Value Date   HGBA1C 6.2  04/22/2016   Assessment & Plan:     Kimberly Guerrero is a 66 y.o. female here for   Prediabetes A1c rising from 6.0-6.2 Discussed diet and exercise lifestyle modifications Long discussion about carbohydrates and which foods contain them Follow-up in 6 months  Hyperlipemia ASCVD 10 year risk is 6.1% Discussed risks and benefit with patient and we decided to start moderate intensity statin with Lipitor daily Patient will also start daily baby aspirin Follow-up in 6 months and recheck LDL  Osteopenia Continue calcium and vitamin D supplementation and increase to twice daily Plan to repeat DEXA scan in 2 years   Virginia Crews, MD MPH PGY-3,  [facility] 05/27/2016  2:24 PM

## 2016-05-27 NOTE — Assessment & Plan Note (Signed)
ASCVD 10 year risk is 6.1% Discussed risks and benefit with patient and we decided to start moderate intensity statin with Lipitor daily Patient will also start daily baby aspirin Follow-up in 6 months and recheck LDL

## 2016-08-14 ENCOUNTER — Other Ambulatory Visit: Payer: Self-pay | Admitting: Student in an Organized Health Care Education/Training Program

## 2016-09-08 ENCOUNTER — Encounter: Payer: Self-pay | Admitting: Family Medicine

## 2016-09-08 ENCOUNTER — Ambulatory Visit (INDEPENDENT_AMBULATORY_CARE_PROVIDER_SITE_OTHER): Payer: Medicare Other | Admitting: Family Medicine

## 2016-09-08 VITALS — BP 100/60 | HR 58 | Temp 98.8°F | Ht 69.0 in | Wt 150.2 lb

## 2016-09-08 DIAGNOSIS — H1132 Conjunctival hemorrhage, left eye: Secondary | ICD-10-CM | POA: Diagnosis not present

## 2016-09-08 NOTE — Patient Instructions (Signed)
Kimberly Guerrero, you were seen today for redness in your eye.  This is called subconjunctival hemorrhage.  This will go away on its own after several weeks.    Unfortunately, I was not able to check your vision because you did not have your regular glasses with you.  If you notice any changes in vision, swelling of your eye, pain, fevers or chills please call the office and make an appointment to be seen urgently.   Very nice to see you today, Kimberly Guerrero L. Rosalyn Gess, [place] Resident PGY-2 09/08/2016 4:54 PM

## 2016-09-08 NOTE — Assessment & Plan Note (Signed)
Significant subconjunctival hemorrhage of left eye. Discussed return precautions with patient. Should be self-limiting. Follow up as needed.

## 2016-09-08 NOTE — Progress Notes (Signed)
    Subjective:  Kimberly Guerrero is a 66 y.o. female who presents to the [facility] today with a chief complaint of red eye  HPI:  Kimberly Guerrero is a 66yo F with no significant PMH who presents today with concern for "pink eye".  She woke up this morning and her eye was completely bloodshot.  Reports no changes in vision, pain with extraocular movements, SOB, CP or headache, but does endorse increased tear production, but no drainage.    Tobacco use reviewed Medication: reviewed and updated ROS: see HPI   Objective:  Physical Exam: BP 100/60   Pulse (!) 58   Temp 98.8 F (37.1 C) (Oral)   Ht 5\' 9"  (1.753 m)   Wt 150 lb 3.2 oz (68.1 kg)   SpO2 97%   BMI 22.18 kg/m   Gen: 65yo F in NAD, resting comfortably HEENT:  EOMI, subconjunctival hemorrhage left eye. No periorbital edema or erythema Skin: no bruising or petechiae Neuro: CN 2-12 WNL, no changes in sensation, strength 5/5 throughout  No results found for this or any previous visit (from the past 72 hour(s)).     Assessment/Plan:  Subconjunctival hemorrhage of left eye Significant subconjunctival hemorrhage of left eye. Discussed return precautions with patient. Should be self-limiting. Follow up as needed.

## 2016-09-11 ENCOUNTER — Other Ambulatory Visit: Payer: Self-pay | Admitting: Family Medicine

## 2016-09-11 DIAGNOSIS — Z1231 Encounter for screening mammogram for malignant neoplasm of breast: Secondary | ICD-10-CM

## 2016-10-20 ENCOUNTER — Ambulatory Visit: Payer: Medicare Other

## 2016-10-21 ENCOUNTER — Ambulatory Visit
Admission: RE | Admit: 2016-10-21 | Discharge: 2016-10-21 | Disposition: A | Discharge status: Home / Self Care | Payer: Medicare Other | Source: Ambulatory Visit | Attending: Family Medicine | Admitting: Family Medicine

## 2016-10-21 DIAGNOSIS — Z1231 Encounter for screening mammogram for malignant neoplasm of breast: Secondary | ICD-10-CM | POA: Diagnosis not present

## 2016-12-01 ENCOUNTER — Encounter: Payer: Self-pay | Admitting: Family Medicine

## 2016-12-01 ENCOUNTER — Ambulatory Visit (INDEPENDENT_AMBULATORY_CARE_PROVIDER_SITE_OTHER): Payer: Medicare Other | Admitting: Family Medicine

## 2016-12-01 VITALS — BP 120/72 | HR 48 | Temp 98.8°F | Ht 69.0 in | Wt 147.6 lb

## 2016-12-01 DIAGNOSIS — R7303 Prediabetes: Secondary | ICD-10-CM | POA: Diagnosis not present

## 2016-12-01 DIAGNOSIS — Z23 Encounter for immunization: Secondary | ICD-10-CM

## 2016-12-01 LAB — POCT GLYCOSYLATED HEMOGLOBIN (HGB A1C): Hemoglobin A1C: 5.7

## 2016-12-01 NOTE — Patient Instructions (Signed)
It was nice meeting you today! You were seen in clinic for an A1c check and it has improved to 5.7 indicating you are doing a great job.  Continue the current lifestyle modifications you have made as this is working.  You do not need to repeat this bloodwork as it is now normal and we can recheck in about 1 year.    The food I had mentioned to you which is a good substitute if you do not like brown rice is quinoa.  It is a grain which you can eat with your meats and vegetables and is a good source of protein.   Please call clinic if you have any questions.

## 2016-12-01 NOTE — Progress Notes (Signed)
   Subjective:   Patient ID: Kimberly Guerrero    DOB: [birth year], 66 y.o. female   MRN: [id number]  CC: f/u diabetes   HPI: Kimberly Guerrero is a 66 y.o. female who presents to clinic today for A1c check.  Prediabetes  -Last A1c 6.2 on 03/2016  -H/o prediabetes with rising A1c 6 months ago, had discussed diet and exercise lifestyle modifications at that time with her previous PCP in addition to discussion about carbohydrate containing foods, was told to f/u in 6 months  -Medications: none currently  -Diet: has been well balanced, mostly vegetables, grilled broiled and baked chicken, less sweets, no soda, drinks mostly water, limits pasta rice and bread, sugar-free jellos and desserts  -Exercise - walking daily about 20-30 min  -A1c checked today - 5.7   Health Maintenance: due for flu shot, she would like one today   ROS: Denies fevers, chills, nausea, vomiting, diarrhea, abdominal pain.  Denies shortness of breath, chest pain, palpitations.   [place]: Pertinent past medical, surgical, family, and social history were reviewed and updated as appropriate. Smoking status reviewed - patient is a never smoker.  Medications reviewed. Objective:   BP 120/72   Pulse (!) 48   Temp 98.8 F (37.1 C) (Oral)   Ht 5\' 9"  (1.753 m)   Wt 147 lb 9.6 oz (67 kg)   SpO2 99%   BMI 21.80 kg/m  Vitals and nursing note reviewed.  General: 66 yo female CV: RRR no MRG  Lungs: CTAB, no wheeze  Abdomen: soft, NTND, +bs  Skin: warm, dry, no rash    Assessment & Plan:   Prediabetes A1c today improved to 5.7 from previous 6.2 in March.  She is very pleased that her hard work has paid off.  She is not currently on medications and has made several improvements to her lifestyle through diet and exercise. - Encouraged her to continue lifestyle modifications - Repeat A1c in 1 year  Health maintenance: -Flu shot administered today  Orders Placed This Encounter  Procedures  . Flu Vaccine QUAD 36+ mos IM  . HgB  A1c   Follow-up: 6 months  Lovenia Kim, MD [place], PGY-2 12/03/2016 7:35 PM

## 2016-12-03 NOTE — Assessment & Plan Note (Signed)
A1c today improved to 5.7 from previous 6.2 in March.  She is very pleased that her hard work has paid off.  She is not currently on medications and has made several improvements to her lifestyle through diet and exercise. - Encouraged her to continue lifestyle modifications - Repeat A1c in 1 year

## 2017-01-01 DIAGNOSIS — H40033 Anatomical narrow angle, bilateral: Secondary | ICD-10-CM | POA: Diagnosis not present

## 2017-01-01 DIAGNOSIS — H04123 Dry eye syndrome of bilateral lacrimal glands: Secondary | ICD-10-CM | POA: Diagnosis not present

## 2017-03-02 ENCOUNTER — Other Ambulatory Visit: Payer: Self-pay | Admitting: Family Medicine

## 2017-03-02 DIAGNOSIS — J301 Allergic rhinitis due to pollen: Secondary | ICD-10-CM

## 2017-03-05 ENCOUNTER — Other Ambulatory Visit: Payer: Self-pay | Admitting: Family Medicine

## 2017-03-05 DIAGNOSIS — J301 Allergic rhinitis due to pollen: Secondary | ICD-10-CM

## 2017-03-17 ENCOUNTER — Other Ambulatory Visit: Payer: Self-pay | Admitting: Family Medicine

## 2017-03-17 DIAGNOSIS — J209 Acute bronchitis, unspecified: Secondary | ICD-10-CM

## 2017-04-02 ENCOUNTER — Other Ambulatory Visit: Payer: Self-pay | Admitting: *Deleted

## 2017-04-02 DIAGNOSIS — J301 Allergic rhinitis due to pollen: Secondary | ICD-10-CM

## 2017-04-02 MED ORDER — CETIRIZINE HCL 10 MG PO TABS: 10.0000 mg | ORAL_TABLET | Freq: Every day | ORAL | 0 refills | Status: DC

## 2017-04-30 ENCOUNTER — Other Ambulatory Visit: Payer: Self-pay

## 2017-04-30 DIAGNOSIS — J301 Allergic rhinitis due to pollen: Secondary | ICD-10-CM

## 2017-05-01 ENCOUNTER — Other Ambulatory Visit: Payer: Self-pay | Admitting: Family Medicine

## 2017-05-01 MED ORDER — CETIRIZINE HCL 10 MG PO TABS: 10.0000 mg | ORAL_TABLET | Freq: Every day | ORAL | 3 refills | Status: DC

## 2017-06-05 ENCOUNTER — Encounter: Payer: Self-pay | Admitting: Family Medicine

## 2017-06-05 ENCOUNTER — Ambulatory Visit (INDEPENDENT_AMBULATORY_CARE_PROVIDER_SITE_OTHER): Payer: Medicare Other | Admitting: Family Medicine

## 2017-06-05 DIAGNOSIS — M533 Sacrococcygeal disorders, not elsewhere classified: Secondary | ICD-10-CM | POA: Diagnosis not present

## 2017-06-05 DIAGNOSIS — G8929 Other chronic pain: Secondary | ICD-10-CM

## 2017-06-05 DIAGNOSIS — M47816 Spondylosis without myelopathy or radiculopathy, lumbar region: Secondary | ICD-10-CM | POA: Insufficient documentation

## 2017-06-05 MED ORDER — METHYLPREDNISOLONE ACETATE 40 MG/ML IJ SUSP
40.0000 mg | Freq: Once | INTRAMUSCULAR | Status: AC
  Administered 2017-06-05: 40 mg via INTRA_ARTICULAR

## 2017-06-05 NOTE — Progress Notes (Signed)
   HPI  CC: Follow-up low back pain Patient is here with recurrent chronic low back pain along the right side.  She states that she has been dealing with this issue for many years.  She was last seen here nearly 2 years ago and received a trigger point injection within the musculature of her right low back.  Patient states that she had 3 to 4 months of relief from this injection.  Unfortunately she has continued to have discomfort in this area.  She denies any radicular symptoms or radiation into the buttock.  Pain is not changed since last being seen 2 years ago.  She denies any new injury, trauma, or event which may have exacerbated this pain.  Pain is worse with activity and prolonged standing.  It is described as mostly constant, aching, and occasionally sharp.  She denies any weakness, numbness, paresthesias, or bladder/bowel incontinence.  Medications/Interventions Tried: Trigger point injection, anti-inflammatories, ice, heat, relative rest.  See HPI and/or previous note for associated ROS.  Objective: BP 118/78   Ht 5\' 9"  (1.753 m)   Wt 146 lb (66.2 kg)   BMI 21.56 kg/m  Gen: NAD, well groomed, a/o x3, normal affect.  CV: Well-perfused. Warm.  Resp: Non-labored.  Neuro: Sensation intact throughout. No gross coordination deficits.  Gait: Nonpathologic posture, unremarkable stride without signs of limp or balance issues. Back: Inspection reveals no evidence of erythema, ecchymosis, or bony deformity.  Tenderness to palpation along the superior most aspect of the SI joint on the right side.  Very small fascial nodule noted in this area, palpation here seem to reproduce some of her symptoms.  Range of motion intact fully.  Negative Faber/Fadir.  Strength 5/5 bilaterally.  Straight leg raise negative.  DTRs +2 bilaterally.  Neurovascular intact distally.  INJECTION: Right-sided low back trigger point Patient was given informed consent, signed copy in the chart. Appropriate time out was  taken.  Myofascial nodule identified under ultrasound.  Area prepped and draped in usual sterile fashion. 1 cc of methylprednisolone 40 mg/ml plus  3 cc of 1% Marcaine was injected into the previously identified myofascial nodule and surrounding paralumbar musculature (iliocostalis muscle group) using a(n) perpendicular approach. The patient tolerated the procedure well. There were no complications. Post procedure instructions were given.   Assessment and plan:  Chronic right SI joint pain Patient is here with signs and symptoms consistent with myofascial pain just superior to the right SI joint.  Fibrous nodule (episacral lipoma) palpable in this area and positively reproduced patient's discomfort.  Much of our time today was spent discussing expectations and treatment options.  Patient is aware that primary treatment course will be directed towards symptomatic control. -Right-sided paralumbar trigger point injection provided today.  Patient tolerated well. -Patient to follow-up PRN.  Next: Patient was informed to contact our office if she has no significant relief over the next 2 weeks.  If this is the case then the next step will be to refer for facet joint injection.  [facility] has already been contacted and informed us that "no new MRI is required if pain is unchanged from the time of the last MRI, and patient has not experienced any new injuries to the low back."  Osteoarthritis of facet joint of lumbar spine See plan above.   Meds ordered this encounter  Medications  . methylPREDNISolone acetate (DEPO-MEDROL) injection 40 mg     Elberta Leatherwood, MD,MS [place] [facility] Fellow 06/05/2017 11:21 AM

## 2017-06-05 NOTE — Assessment & Plan Note (Signed)
See plan above.

## 2017-06-05 NOTE — Assessment & Plan Note (Signed)
Patient is here with signs and symptoms consistent with myofascial pain just superior to the right SI joint.  Fibrous nodule (episacral lipoma) palpable in this area and positively reproduced patient's discomfort.  Much of our time today was spent discussing expectations and treatment options.  Patient is aware that primary treatment course will be directed towards symptomatic control. -Right-sided paralumbar trigger point injection provided today.  Patient tolerated well. -Patient to follow-up PRN.  Next: Patient was informed to contact our office if she has no significant relief over the next 2 weeks.  If this is the case then the next step will be to refer for facet joint injection.  [facility] has already been contacted and informed us that "no new MRI is required if pain is unchanged from the time of the last MRI, and patient has not experienced any new injuries to the low back."

## 2017-06-05 NOTE — Patient Instructions (Signed)
   I hope this injection helps her quite a bit.  If you are not having any relief within the next 1 to 2 weeks and want to consider the other type of injection that is done through [facility] imaging, please call me and we will set that up.  If this injection works and you get several months of relief and then need a repeat, just make an appointment to see me.  Great to see you!

## 2017-06-08 NOTE — Progress Notes (Signed)
[facility]: Attending Note: I have reviewed the chart, discussed wit the Sports Medicine Fellow. I agree with assessment and treatment plan as detailed in the [place] note. Long discussion. I assisted with CSI using ultrasound guidance. If not improving, could consider facet joint injection under fluoroscopy ([facility]). They said for facet inj she would NOT need updated MRI.  Greater than 50% of our 25 minute office visit was spent in counseling and education regarding these issues.  If this works, will f/u PRN

## 2017-06-23 ENCOUNTER — Other Ambulatory Visit: Payer: Self-pay

## 2017-06-24 MED ORDER — IBUPROFEN 800 MG PO TABS: 800.0000 mg | ORAL_TABLET | Freq: Every day | ORAL | 2 refills | Status: DC | PRN

## 2017-07-07 ENCOUNTER — Ambulatory Visit (INDEPENDENT_AMBULATORY_CARE_PROVIDER_SITE_OTHER): Payer: Medicare Other | Admitting: Family Medicine

## 2017-07-07 ENCOUNTER — Encounter: Payer: Self-pay | Admitting: Family Medicine

## 2017-07-07 ENCOUNTER — Telehealth: Payer: Self-pay | Admitting: Family Medicine

## 2017-07-07 VITALS — BP 100/70 | HR 59 | Temp 98.8°F | Wt 140.6 lb

## 2017-07-07 DIAGNOSIS — M47816 Spondylosis without myelopathy or radiculopathy, lumbar region: Secondary | ICD-10-CM | POA: Diagnosis not present

## 2017-07-07 DIAGNOSIS — R7303 Prediabetes: Secondary | ICD-10-CM | POA: Diagnosis not present

## 2017-07-07 LAB — POCT GLYCOSYLATED HEMOGLOBIN (HGB A1C): HbA1c, POC (prediabetic range): 5.7 % (ref 5.7–6.4)

## 2017-07-07 NOTE — Patient Instructions (Signed)
It was nice seeing you again today.  You were seen in clinic for follow-up of your back pain and a physical.  We discussed options which you have already tried such as antiinflammatories, heating pads and range of motion exercises to maintain good mobility.  I also reviewed your previous imaging which showed osteoarthritic changes in your lower spine and is likely causing your lower back pain.  I would recommend trying physical therapy again which you may find helpful.  Please call clinic if you change your mind about this and I will place the referral.    Continue to exercise with light walking as you have been and I would advise taking one 800 mg tablet of ibuprofen daily.   Please call clinic if you have any questions.  Be well, Lovenia Kim MD

## 2017-07-07 NOTE — Telephone Encounter (Signed)
Pt called and said she dicussed wanting stool cards. She would like to know if there is any way for them to be left up front and picked up in the office? She would like to be contacted to know when she would be able to pick them up.

## 2017-07-07 NOTE — Progress Notes (Signed)
   Subjective:   Patient ID: Kimberly Guerrero    DOB: 1951/01/21, 67 y.o. female   MRN: [id number]  CC: physical, f/u back pain   HPI: Kimberly Guerrero is a 67 y.o. female who presents to clinic today for the following issue.  Back pain Patient reports chronic back pain x15 years.  Pain is right-sided lumbar.  She describes the pain as dull and achy in nature.  She denies any radiation into the buttock or legs.  No h/o fall or injury prior to onset.    No acute worsening reported.  He has been taking ibuprofen, 800 mg daily.  She denies numbness or tingling, weakness in legs.  Denies loss of bowel or bladder control.  She has been walking, doing stretching exercises, range of motion exercises and using heating pads.  She has also try topical IcyHot and capsaicin without any relief.  She states she has tried physical therapy in the past which did provide some relief.  She had a trigger point injection done last week which she feels did not help much.   Prediabetes She is also concerned about her A1c and would like it checked today.  Has been borderline in the past.  No polyuria or polydipsia reported.  She has not had any recent changes in appetite or exercise.     ROS: Fever, chills, nausea, vomiting.  No chest pain, shortness of breath, leg swelling.  No numbness or tingling.  Social: Patient is a never smoker.  Medications reviewed.   Objective:   BP 100/70 (BP Location: Right Arm, Patient Position: Sitting, Cuff Size: Normal)   Pulse (!) 59   Temp 98.8 F (37.1 C) (Oral)   Wt 140 lb 9.6 oz (63.8 kg)   SpO2 98%   BMI 20.76 kg/m  Vitals and nursing note reviewed.  General: Well-appearing 67 y/o female, NAD  Neck: supple, normal range of motion  CV: RRR no MRG  Lungs: CTAB, non-laboured  Back: no evidence of ecchymosis or obvious bony deformity on inspection.  Tenderness along right SI joint.  ROM is intact.  Strength 5/5 bilaterally.  Straight leg test is negative.  Neurovascularly  intact distally.  Skin: warm, dry, no rash  Extremities: warm and well perfused, normal tone   Assessment & Plan:   Osteoarthritis of facet joint of lumbar spine Stable, no acute worsening but pt reports ongoing pain.  XR and MRI reviewed - shows osteosarthritic changes at her SI joints bilaterally.  No red flags on exam.  S/p R-sided paralumbar trigger point injection at [facility] about a month ago on 5/10 which she tolerated well but feels has not provided much relief.  Discussed other conservative measures as well as referral for facet joint injection.  She would like to think about this-- will continue to monitor and advised pt to follow up with Sports Medicine if she desires injection.  Prediabetes Check A1c today  Orders Placed This Encounter  Procedures  . POCT glycosylated hemoglobin (Hb A1C)    Lovenia Kim, MD [place], PGY-2

## 2017-07-10 ENCOUNTER — Other Ambulatory Visit: Payer: Self-pay | Admitting: Family Medicine

## 2017-07-10 DIAGNOSIS — Z0000 Encounter for general adult medical examination without abnormal findings: Secondary | ICD-10-CM

## 2017-07-10 DIAGNOSIS — Z1211 Encounter for screening for malignant neoplasm of colon: Secondary | ICD-10-CM

## 2017-07-10 NOTE — Assessment & Plan Note (Signed)
Stable, no acute worsening but pt reports ongoing pain.  XR and MRI reviewed - shows osteosarthritic changes at her SI joints bilaterally.  No red flags on exam.  S/p R-sided paralumbar trigger point injection at [facility] about a month ago on 5/10 which she tolerated well but feels has not provided much relief.  Discussed other conservative measures as well as referral for facet joint injection.  She would like to think about this-- will continue to monitor and advised pt to follow up with Sports Medicine if she desires injection.

## 2017-07-10 NOTE — Assessment & Plan Note (Signed)
Check A1c today.

## 2017-07-14 NOTE — Addendum Note (Signed)
Addended by: [place] Pink on: 07/14/2017 02:31 PM   Modules accepted: Orders

## 2017-07-15 DIAGNOSIS — Z1211 Encounter for screening for malignant neoplasm of colon: Secondary | ICD-10-CM | POA: Diagnosis not present

## 2017-07-15 NOTE — Addendum Note (Signed)
Addended by: Francene Castle on: 07/15/2017 11:08 AM   Modules accepted: Orders

## 2017-07-16 NOTE — Telephone Encounter (Signed)
Discussed with Herbie [place] who has called patient and will provide these.

## 2017-07-17 LAB — FECAL OCCULT BLOOD, IMMUNOCHEMICAL: FECAL OCCULT BLD: NEGATIVE

## 2017-07-31 ENCOUNTER — Telehealth: Payer: Self-pay | Admitting: Family Medicine

## 2017-07-31 NOTE — Telephone Encounter (Signed)
Pt called requesting her results from her colonoscopy. Please call pt back to discuss these.

## 2017-07-31 NOTE — Telephone Encounter (Signed)
Per my review, she did not have a colonoscopy but had completed stool card testing which was negative.  Please inform pt of result, thanks

## 2017-07-31 NOTE — Telephone Encounter (Signed)
[facility] informing pt of negative stool test. Delray Alt, CMA

## 2017-08-11 ENCOUNTER — Encounter (HOSPITAL_COMMUNITY): Payer: Self-pay | Admitting: Emergency Medicine

## 2017-08-11 ENCOUNTER — Emergency Department (HOSPITAL_COMMUNITY)
Admission: EM | Admit: 2017-08-11 | Discharge: 2017-08-11 | Disposition: A | Discharge status: Home / Self Care | Payer: Medicare Other | Attending: Emergency Medicine | Admitting: Emergency Medicine

## 2017-08-11 ENCOUNTER — Other Ambulatory Visit: Payer: Self-pay

## 2017-08-11 DIAGNOSIS — Z79899 Other long term (current) drug therapy: Secondary | ICD-10-CM | POA: Diagnosis not present

## 2017-08-11 DIAGNOSIS — M545 Low back pain: Secondary | ICD-10-CM | POA: Diagnosis not present

## 2017-08-11 DIAGNOSIS — G8929 Other chronic pain: Secondary | ICD-10-CM | POA: Diagnosis not present

## 2017-08-11 DIAGNOSIS — M533 Sacrococcygeal disorders, not elsewhere classified: Secondary | ICD-10-CM | POA: Diagnosis not present

## 2017-08-11 DIAGNOSIS — M7918 Myalgia, other site: Secondary | ICD-10-CM | POA: Insufficient documentation

## 2017-08-11 MED ORDER — IBUPROFEN 400 MG PO TABS
600.0000 mg | ORAL_TABLET | Freq: Once | ORAL | Status: AC
  Administered 2017-08-11: 600 mg via ORAL
  Filled 2017-08-11: qty 1

## 2017-08-11 NOTE — ED Notes (Signed)
ED Provider at bedside. 

## 2017-08-11 NOTE — ED Triage Notes (Signed)
Patient with left leg pain, it goes straight down her left buttock to her left leg.  She states that she has a hard time walking when she does have it.  She states that it has been going on since Saturday.  No injury per patient.

## 2017-08-11 NOTE — Discharge Instructions (Addendum)
You may use over-the-counter Motrin (Ibuprofen), Acetaminophen (Tylenol), topical muscle creams such as SalonPas, Icy Hot, Bengay, etc. Please stretch, apply heat, and have massage therapy for additional assistance. ° °

## 2017-08-11 NOTE — ED Provider Notes (Signed)
[facility] Provider Note  CSN: [id number] Arrival date & time: 08/11/17 0451  Chief Complaint(s) Leg Pain  HPI Kimberly Guerrero is a 67 y.o. female with a history of depression, osteoarthritis, chronic back pain who presents to the emergency department with new intermittent left buttock pain that is been ongoing for several days.  Pain is intermittent in nature.  She states that she  usually feels the pain in the morning while trying to get up out of bed.  Pain is exacerbated with movement and palpation of the left buttock.  States that it is a cramping sensation that radiates down to the back of her thigh.  She denies any falls or trauma.  No fevers or chills.  She denies any history of cancer.  No bladder/bowel incontinence.  No lower extremity weakness or loss of sensation.  HPI  Past Medical History Past Medical History:  Diagnosis Date  . Chronic fatigue   . Depression   . Osteoarthritis    Patient Active Problem List   Diagnosis Date Noted  . Chronic right SI joint pain 06/05/2017  . Osteoarthritis of facet joint of lumbar spine 06/05/2017  . Subconjunctival hemorrhage of left eye 09/08/2016  . Prediabetes 05/27/2016  . Hyperlipemia 05/27/2016  . Osteopenia 05/27/2016  . Allergic rhinitis 11/03/2014  . Chronic fatigue 03/27/2014  . Preventative health care 03/27/2014  . Osteoarthritis 06/06/2011  . DEPRESSION, MODERATE, RECURRENT 09/24/2009   Home Medication(s) Prior to Admission medications   Medication Sig Start Date End Date Taking? Authorizing Provider  atorvastatin (LIPITOR) 40 MG tablet TAKE 1 TABLET(40 MG) BY MOUTH DAILY 05/01/17   Lovenia Kim, MD  fluticasone (FLONASE) 50 MCG/ACT nasal spray SHAKE LIQUID AND USE 2 SPRAYS IN EACH NOSTRIL DAILY AS NEEDED FOR ALLERGIES OR RHINITIS 03/17/17   Lovenia Kim, MD  ibuprofen (ADVIL,MOTRIN) 800 MG tablet Take 1 tablet (800 mg total) by mouth daily as needed. 06/24/17   Lovenia Kim, MD    mometasone (NASONEX) 50 MCG/ACT nasal spray Place 2 sprays into the nose daily. 09/14/15   Mayo, Pete Pelt, MD                                                                                                                                    Past Surgical History History reviewed. No pertinent surgical history. Family History Family History  Problem Relation Age of Onset  . Breast cancer Neg Hx     Social History Social History   Tobacco Use  . Smoking status: Never Smoker  . Smokeless tobacco: Never Used  Substance Use Topics  . Alcohol use: No    Alcohol/week: 0.0 oz  . Drug use: No   Allergies Tramadol  Review of Systems Review of Systems As noted in HPI Physical Exam Vital Signs  I have reviewed the triage vital signs BP 120/75 (BP Location: Right Arm)   Pulse (!) 50  Temp 98.4 F (36.9 C) (Oral)   Ht 5\' 9"  (1.753 m)   Wt 63.5 kg (140 lb)   SpO2 100%   BMI 20.67 kg/m   Physical Exam  Constitutional: She is oriented to person, place, and time. She appears well-developed and well-nourished. No distress.  HENT:  Head: Normocephalic and atraumatic.  Right Ear: External ear normal.  Left Ear: External ear normal.  Nose: Nose normal.  Eyes: Conjunctivae and EOM are normal. No scleral icterus.  Neck: Normal range of motion and phonation normal.  Cardiovascular: Normal rate and regular rhythm.  Pulmonary/Chest: Effort normal. No stridor. No respiratory distress.  Abdominal: She exhibits no distension.  Musculoskeletal: Normal range of motion. She exhibits no edema.       Left hip: She exhibits no tenderness and no bony tenderness.       Lumbar back: She exhibits no tenderness and no bony tenderness.  Neurological: She is alert and oriented to person, place, and time.  Skin: She is not diaphoretic.  Psychiatric: She has a normal mood and affect. Her behavior is normal.  Vitals reviewed.   ED Results and Treatments Labs (all labs ordered are listed, but only  abnormal results are displayed) Labs Reviewed - No data to display                                                                                                                       EKG  EKG Interpretation  Date/Time:    Ventricular Rate:    PR Interval:    QRS Duration:   QT Interval:    QTC Calculation:   R Axis:     Text Interpretation:        Radiology No results found. Pertinent labs & imaging results that were available during my care of the patient were reviewed by me and considered in my medical decision making (see chart for details).  Medications Ordered in ED Medications  ibuprofen (ADVIL,MOTRIN) tablet 600 mg (600 mg Oral Given 08/11/17 6295)                                                                                                                                    Procedures Procedures  (including critical care time)  Medical Decision Making / ED Course I have reviewed the nursing notes for this encounter and the patient's prior records (if available in EHR or on provided paperwork).  67 y.o. female presents with new left gluteal pain for several days. No acute traumatic onset. No red flag symptoms of fever, weight loss, saddle anesthesia, weakness, fecal/urinary incontinence or urinary retention.   Suspect MSK etiology. No indication for imaging emergently. Patient was recommended to take short course of scheduled NSAIDs and engage in early mobility as definitive treatment. Return precautions discussed for worsening or new concerning symptoms.   The patient is safe for discharge with strict return precautions.  Final Clinical Impression(s) / ED Diagnoses Final diagnoses:  Chronic bilateral low back pain without sciatica  Left buttock pain   Disposition: Discharge  Condition: Good  I have discussed the results, Dx and Tx plan with the patient who expressed understanding and agree(s) with the plan. Discharge instructions discussed at great length.  The patient was given strict return precautions who verbalized understanding of the instructions. No further questions at time of discharge.    ED Discharge Orders    None       Follow Up: Primary care provider  Schedule an appointment as soon as possible for a visit  If symptoms do not improve or  worsen      This chart was dictated using voice recognition software.  Despite best efforts to proofread,  errors can occur which can change the documentation meaning.   Fatima Blank, MD 08/11/17 [phone]

## 2017-08-12 ENCOUNTER — Ambulatory Visit (INDEPENDENT_AMBULATORY_CARE_PROVIDER_SITE_OTHER): Payer: Medicare Other | Admitting: Family Medicine

## 2017-08-12 ENCOUNTER — Encounter: Payer: Self-pay | Admitting: Family Medicine

## 2017-08-12 VITALS — BP 110/80 | HR 52 | Temp 98.6°F | Wt 142.8 lb

## 2017-08-12 DIAGNOSIS — M62838 Other muscle spasm: Secondary | ICD-10-CM | POA: Diagnosis not present

## 2017-08-12 MED ORDER — METHOCARBAMOL 500 MG PO TABS: ORAL_TABLET | ORAL | 0 refills | Status: DC

## 2017-08-12 NOTE — Patient Instructions (Addendum)
It was great meeting you today! I think you are having muscle cramps caused by dehydration. It is very hot outside and I think some of your electrolytes are getting a little low. I would recommend drinking half to a full bottle of gatorade or powerade per day if you are going to be outside or even outside running errands. You can also eat bananas, mangoes, or any other fruits that has high potassium. Some people try pickle juice so this is also an option. I will also send in a short supply of muscle relaxers in case you do have a spasm at night. I would actually take one prior to going to sleep for the next night or two to prevent a cramp. After the next two nights I would only use them as needed.  For sleep aides I would try either chamomile tea shortly before bed, or melatonin gummies. The gummies can be bought over the counter from any drug store or [employer].

## 2017-08-13 DIAGNOSIS — M62838 Other muscle spasm: Secondary | ICD-10-CM | POA: Insufficient documentation

## 2017-08-13 NOTE — Progress Notes (Signed)
   HPI 67 year old who presents as a same day for left leg pain. She states that the pain almost always starts at night and has happened for 2-3 nights in a row. It will wake her up. The pain is a constant tightening of her left buttock and left hamstrings. It will continue to tighten, causing extreme pain, until it eventually loosens up 30 minutes to 2 hours later. She has not been eating any salt in her diet and states that she has been outside a lot recently. She tried ibuprofen to relief the pain which did not help.  She was seen in the ed on 7/16 and diagnosed with msk pain and told to take nsaids.  CC: left leg pain   ROS:   Review of Systems See HPI for ROS.   CC, SH/smoking status, and VS noted  Objective: BP 110/80 (BP Location: Left Arm, Patient Position: Sitting, Cuff Size: Normal)   Pulse (!) 52   Temp 98.6 F (37 C) (Oral)   Wt 142 lb 12.8 oz (64.8 kg)   SpO2 99%   BMI 21.09 kg/m  Gen: NAD, alert, cooperative, and pleasant. Well appearing AA female resting comfortably in chair. NO acute pain HEENT: NCAT, EOMI, PERRL CV: RRR, no murmur Resp: CTAB, no wheezes, non-labored Abd: SNTND, BS present, no guarding or organomegaly Ext: No edema, warm Neuro: Alert and oriented, Speech clear, No gross deficits MSK: no limitation of motion, no tenderness in left hamstring or buttock  Assessment and plan:  Muscle spasm of left lower extremity Likely having muscle spasms. In context of limiting her electrolytes due to low salt diet and spending some time outside she is likely low in either sodium or potassium. Encouraged her to drink 1 bottle of gatorade per day for the next couple of weeks to keep electrolytes replenished. Also encouraged to eat potassium-rich foods. Gave her very short supply of robaxin 500mg  for acute exacerbations. - robaxin 500mg  prn - gatorade daily + water hydration - encouraged to eat potassium rich foods - follow up prn   No orders of the defined  types were placed in this encounter.   Meds ordered this encounter  Medications  . methocarbamol (ROBAXIN) 500 MG tablet    Sig: Can take 1 500mg  tablet as needed for muscle spasm. Can take up to 4 times per day.    Dispense:  20 tablet    Refill:  0     Guadalupe Dawn MD PGY-2 Family Medicine Resident  08/13/2017 1:18 PM

## 2017-08-13 NOTE — Assessment & Plan Note (Signed)
Likely having muscle spasms. In context of limiting her electrolytes due to low salt diet and spending some time outside she is likely low in either sodium or potassium. Encouraged her to drink 1 bottle of gatorade per day for the next couple of weeks to keep electrolytes replenished. Also encouraged to eat potassium-rich foods. Gave her very short supply of robaxin 500mg  for acute exacerbations. - robaxin 500mg  prn - gatorade daily + water hydration - encouraged to eat potassium rich foods - follow up prn

## 2017-08-15 DIAGNOSIS — R202 Paresthesia of skin: Secondary | ICD-10-CM | POA: Diagnosis not present

## 2017-08-16 DIAGNOSIS — S76312A Strain of muscle, fascia and tendon of the posterior muscle group at thigh level, left thigh, initial encounter: Secondary | ICD-10-CM | POA: Diagnosis not present

## 2017-08-17 ENCOUNTER — Telehealth: Payer: Self-pay | Admitting: Family Medicine

## 2017-08-17 NOTE — Telephone Encounter (Signed)
Pt went to [facility] Saturday and Sunday. She was given hydrocodone for the pain. She was told she had a strained hamstring. She needs dr Reesa Chew to prescribe her some more pain meds and schedule PT for her. She is not happy with the performace at [facility] and may have to leave the practice.  She also needs a refill on atorvastatin.  [facility] on [address].  She needs help ASAP

## 2017-08-17 NOTE — Telephone Encounter (Signed)
She took an appt with Dr Reesa Chew Friday July 26.  She is also requesting Dr Nori Riis  To call her back

## 2017-08-18 ENCOUNTER — Other Ambulatory Visit: Payer: Self-pay | Admitting: Family Medicine

## 2017-08-18 ENCOUNTER — Other Ambulatory Visit: Payer: Self-pay

## 2017-08-18 MED ORDER — ATORVASTATIN CALCIUM 40 MG PO TABS: ORAL_TABLET | ORAL | 0 refills | Status: DC

## 2017-08-18 NOTE — Progress Notes (Signed)
Opened in error

## 2017-08-18 NOTE — Telephone Encounter (Signed)
Atorvastatin refilled.  

## 2017-08-18 NOTE — Telephone Encounter (Signed)
Needs refill on atorvastatin. [facility] on [address]

## 2017-08-21 ENCOUNTER — Other Ambulatory Visit: Payer: Self-pay | Admitting: Family Medicine

## 2017-08-21 ENCOUNTER — Ambulatory Visit (INDEPENDENT_AMBULATORY_CARE_PROVIDER_SITE_OTHER): Payer: Medicare Other | Admitting: Family Medicine

## 2017-08-21 ENCOUNTER — Ambulatory Visit: Payer: Medicare Other | Admitting: Family Medicine

## 2017-08-21 ENCOUNTER — Other Ambulatory Visit: Payer: Self-pay

## 2017-08-21 VITALS — BP 112/60 | Temp 98.1°F | Wt 139.8 lb

## 2017-08-21 DIAGNOSIS — T148XXA Other injury of unspecified body region, initial encounter: Secondary | ICD-10-CM

## 2017-08-21 DIAGNOSIS — M62838 Other muscle spasm: Secondary | ICD-10-CM

## 2017-08-21 MED ORDER — BACLOFEN 5 MG PO TABS: 5.0000 mg | ORAL_TABLET | Freq: Three times a day (TID) | ORAL | 0 refills | Status: DC | PRN

## 2017-08-21 NOTE — Telephone Encounter (Signed)
Pt called nurse line stating she is still in a lot of pain and would like a refill on Robaxin. This request is pending for Kimberly Guerrero, as he saw her for this issue. Pt has an apt with pcp on Monday. Please advise.

## 2017-08-21 NOTE — Patient Instructions (Signed)
It was a pleasure seeing you today.   Today we discussed your muscle pain  For your pain: I have prescribed baclofen to be taken three times a day as needed. Do not operate heavy machinery or drive a car after as you can be sleepy. Continue ibuprofen and heat. I have referred you to physical therapy.   Please follow up in 2 weeks if no improvement or sooner if symptoms persist or worsen. Please call the clinic immediately if you have any concerns.   Our clinic's number is [phone]. Please call with questions or concerns.    Thank you,  Caroline More, DO

## 2017-08-24 ENCOUNTER — Ambulatory Visit: Payer: Medicare Other | Admitting: Family Medicine

## 2017-08-24 NOTE — Progress Notes (Signed)
   Subjective:    Patient ID: Kimberly Guerrero, female    DOB: Dec 11, 1950, 67 y.o.   MRN: [id number]   CC: left leg pain  HPI: Left leg pain Patient today presenting for left leg pain, although patient points to left gluteal area.  Patient reports pain is occurring for the past 2 weeks.  Patient was recently seen on 7/17 by Dr. Kris Mouton who diagnosed patient with muscle spasms of left lower extremity and prescribed Robaxin 500 mg as needed.  Patient says Robaxin did not help.  Says that muscle pain occurs daily usually all day.  Pain is worse when bending or leaning on the area.  Says that there are days that she does not have pain, most recently Tuesday was a "good day".  Patient has tried ibuprofen with out help.  Tylenol does not help.  Patient says it is difficult to stand sometimes when it is very painful.  Patient says she has tried stretching and ice but that does not help much either.  Patient denies any trauma to the area or falls.  Patient usually leads a very active lifestyle doing yoga, aerobics, and walking twice a week but has not been as active since pain started.  Denies fevers or chills.  Denies nausea vomiting diarrhea.  Denies any weakness to the area.   Objective:  BP 112/60 (BP Location: Left Arm, Patient Position: Sitting, Cuff Size: Normal)   Temp 98.1 F (36.7 C) (Oral)   Wt 139 lb 12.8 oz (63.4 kg)   SpO2 98%   BMI 20.64 kg/m  Vitals and nursing note reviewed  General: well nourished, in no acute distress Extremities: no edema or cyanosis. Warm, well perfused. 2+ radial and PT pulses bilaterally MSK: Full range of motion in lower extremities bilaterally in both active and passive.  No tenderness to palpation of left lower extremity at rest.  During flexion of left lower extremity patient reports some tenderness to palpation of gluteus maximus muscle and around IT band. Skin: warm and dry, no rashes noted Neuro: alert and oriented, no focal deficits   Assessment &  Plan:    Muscle spasm of left lower extremity Patient likely with muscle spasms as pain is worsened when flexing to maximum range of motion but there is no pain at rest.  Printed prescription for baclofen given to patient as patient says she would prefer a written prescription over electronically sent.  Will give referral to physical therapy as patient reports no improvement with Robaxin and other conservative measures at this time.  Patient led a very active lifestyle prior to pain and there may be a component of patient overexerting herself which led to muscle pain.  Patient requesting hydrocodone however informed patient that as this is musculoskeletal a muscle relaxant will likely help more with pain.  Patient says she has a follow-up appointment on 7-29 with PCP Dr. Reesa Chew -Baclofen 5 mg 3 times daily as needed -Amatory referral to physical therapy -Follow-up in 2 weeks if no improvement    Return in about 2 weeks (around 09/04/2017).   Caroline More, DO, PGY-2

## 2017-08-24 NOTE — Assessment & Plan Note (Signed)
Patient likely with muscle spasms as pain is worsened when flexing to maximum range of motion but there is no pain at rest.  Printed prescription for baclofen given to patient as patient says she would prefer a written prescription over electronically sent.  Will give referral to physical therapy as patient reports no improvement with Robaxin and other conservative measures at this time.  Patient led a very active lifestyle prior to pain and there may be a component of patient overexerting herself which led to muscle pain.  Patient requesting hydrocodone however informed patient that as this is musculoskeletal a muscle relaxant will likely help more with pain.  Patient says she has a follow-up appointment on 7-29 with PCP Dr. Reesa Chew -Baclofen 5 mg 3 times daily as needed -Amatory referral to physical therapy -Follow-up in 2 weeks if no improvement

## 2017-08-27 MED ORDER — METHOCARBAMOL 500 MG PO TABS: ORAL_TABLET | ORAL | 0 refills | Status: DC

## 2017-08-28 ENCOUNTER — Ambulatory Visit (INDEPENDENT_AMBULATORY_CARE_PROVIDER_SITE_OTHER): Payer: Medicare Other | Admitting: Family Medicine

## 2017-08-28 DIAGNOSIS — M47816 Spondylosis without myelopathy or radiculopathy, lumbar region: Secondary | ICD-10-CM

## 2017-08-28 MED ORDER — PREDNISONE 10 MG PO TABS: ORAL_TABLET | ORAL | 0 refills | Status: DC

## 2017-08-28 MED ORDER — HYDROCODONE-ACETAMINOPHEN 5-325 MG PO TABS: 1.0000 | ORAL_TABLET | Freq: Four times a day (QID) | ORAL | 0 refills | Status: AC | PRN

## 2017-08-28 NOTE — Assessment & Plan Note (Signed)
I think her current left hamstring area pain is related to her relatively significant facet joint and other arthritic changes in her back.  We will try prednisone burst.  May need to get new MRI so we could consider epidural steroid.  When I saw her at last office visit, most of her pain was actually in the back and we were considering facet joint injections and would not need a new MRI for that.  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  She might need a new MRI for that.  I will see her back next week.  We spent greater than 50% of our 25-minute office visit counseling education regarding these issues and coordination of care.

## 2017-08-28 NOTE — Patient Instructions (Signed)
In about a week, cal the office--[phone] and let me know if you are improving or not. If you are not getting better at all, we may need to order an MRI.  I want  You to take the prednisone as we have written out--and see me back here at sports medicine in about a month or so.   If anything gets a lot worse, call my office and we will see you sooner.

## 2017-08-28 NOTE — Progress Notes (Signed)
    CHIEF COMPLAINT / HPI: Left posterior leg pain.  10 out of 10 pain.  Constant has been keeping her awake does not radiate past the left knee.  Aching in nature no bowel or bladder incontinence  REVIEW OF SYSTEMS: No fever.  PERTINENT  PMH / PSH: I have reviewed the patient's medications, allergies, past medical and surgical history, smoking status and updated in the EMR as appropriate.   OBJECTIVE: GENERAL: Well-developed female no acute distress NEURO: Straight leg raise positive in seated position on the left with some pain contralateral straight leg raise as well.   DTRs 1+ symmetrical at knee and ankle Hip flexor strength 5 out of 5, knee extension, knee flexion 5 out of 5 symmetrical.  Ankle dorsiflexion plantarflexion 5 out of 5 symmetrical.  Gait antalgic: Stooped upper body posture  ASSESSMENT / PLAN:  Osteoarthritis of facet joint of lumbar spine I think her current left hamstring area pain is related to her relatively significant facet joint and other arthritic changes in her back.  We will try prednisone burst.  May need to get new MRI so we could consider epidural steroid.  When I saw her at last office visit, most of her pain was actually in the back and we were considering facet joint injections and would not need a new MRI for that.  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  She might need a new MRI for that.  I will see her back next week.  We spent greater than 50% of our 25-minute office visit counseling education regarding these issues and coordination of care.

## 2017-09-01 ENCOUNTER — Telehealth: Payer: Self-pay | Admitting: *Deleted

## 2017-09-01 NOTE — Telephone Encounter (Signed)
Rx refill request for cetrizine 10mg  tablets, not on med list. Please advise. Ahliya Glatt Kennon Holter, CMA

## 2017-09-04 ENCOUNTER — Other Ambulatory Visit: Payer: Self-pay | Admitting: Family Medicine

## 2017-09-04 MED ORDER — CETIRIZINE HCL 10 MG PO TABS: 10.0000 mg | ORAL_TABLET | Freq: Every day | ORAL | 11 refills | Status: DC

## 2017-09-10 ENCOUNTER — Other Ambulatory Visit: Payer: Self-pay | Admitting: Family Medicine

## 2017-09-10 DIAGNOSIS — Z1231 Encounter for screening mammogram for malignant neoplasm of breast: Secondary | ICD-10-CM

## 2017-10-09 ENCOUNTER — Ambulatory Visit: Payer: Medicare Other | Admitting: Family Medicine

## 2017-10-23 ENCOUNTER — Ambulatory Visit
Admission: RE | Admit: 2017-10-23 | Discharge: 2017-10-23 | Disposition: A | Discharge status: Home / Self Care | Payer: Medicare Other | Source: Ambulatory Visit | Attending: Family Medicine | Admitting: Family Medicine

## 2017-10-23 DIAGNOSIS — Z1231 Encounter for screening mammogram for malignant neoplasm of breast: Secondary | ICD-10-CM | POA: Diagnosis not present

## 2017-11-05 ENCOUNTER — Ambulatory Visit: Payer: Medicare Other

## 2017-11-09 ENCOUNTER — Other Ambulatory Visit: Payer: Self-pay

## 2017-11-09 MED ORDER — ATORVASTATIN CALCIUM 40 MG PO TABS: ORAL_TABLET | ORAL | 0 refills | Status: DC

## 2017-12-18 ENCOUNTER — Ambulatory Visit: Payer: Medicare Other | Admitting: Family Medicine

## 2018-01-25 DIAGNOSIS — H5203 Hypermetropia, bilateral: Secondary | ICD-10-CM | POA: Diagnosis not present

## 2018-01-25 DIAGNOSIS — H524 Presbyopia: Secondary | ICD-10-CM | POA: Diagnosis not present

## 2018-01-25 DIAGNOSIS — H5213 Myopia, bilateral: Secondary | ICD-10-CM | POA: Diagnosis not present

## 2018-02-12 ENCOUNTER — Other Ambulatory Visit: Payer: Self-pay

## 2018-02-12 MED ORDER — IBUPROFEN 800 MG PO TABS: 800.0000 mg | ORAL_TABLET | Freq: Every day | ORAL | 2 refills | Status: DC | PRN

## 2018-02-16 ENCOUNTER — Other Ambulatory Visit: Payer: Self-pay | Admitting: Family Medicine

## 2018-02-22 DIAGNOSIS — H524 Presbyopia: Secondary | ICD-10-CM | POA: Diagnosis not present

## 2018-02-24 ENCOUNTER — Other Ambulatory Visit: Payer: Self-pay | Admitting: Family Medicine

## 2018-02-24 ENCOUNTER — Other Ambulatory Visit: Payer: Self-pay

## 2018-02-24 ENCOUNTER — Ambulatory Visit (INDEPENDENT_AMBULATORY_CARE_PROVIDER_SITE_OTHER): Payer: Medicare Other | Admitting: Family Medicine

## 2018-02-24 ENCOUNTER — Encounter: Payer: Self-pay | Admitting: Family Medicine

## 2018-02-24 VITALS — BP 98/62 | HR 62 | Temp 98.6°F | Ht 69.0 in | Wt 147.0 lb

## 2018-02-24 DIAGNOSIS — M47816 Spondylosis without myelopathy or radiculopathy, lumbar region: Secondary | ICD-10-CM | POA: Diagnosis not present

## 2018-02-24 DIAGNOSIS — Z0000 Encounter for general adult medical examination without abnormal findings: Secondary | ICD-10-CM

## 2018-02-24 DIAGNOSIS — Z23 Encounter for immunization: Secondary | ICD-10-CM

## 2018-02-24 DIAGNOSIS — Z1211 Encounter for screening for malignant neoplasm of colon: Secondary | ICD-10-CM

## 2018-02-24 DIAGNOSIS — Z131 Encounter for screening for diabetes mellitus: Secondary | ICD-10-CM

## 2018-02-24 LAB — POCT GLYCOSYLATED HEMOGLOBIN (HGB A1C): Hemoglobin A1C: 5.7 % — AB (ref 4.0–5.6)

## 2018-02-24 MED ORDER — MELOXICAM 7.5 MG PO TABS: 7.5000 mg | ORAL_TABLET | Freq: Every day | ORAL | 1 refills | Status: DC

## 2018-02-24 NOTE — Assessment & Plan Note (Signed)
Significant arthritic changes in her back causing pain.  No acute worsening at this time.  No red flags on exam. -Rx: will trial Mobic 7.5 mg daily, may consider increasing to 15 mg if needed  Advised to continue heating pad and gentle range of motion exercises for her lower back

## 2018-02-24 NOTE — Progress Notes (Signed)
   Subjective:   Patient ID: Kimberly Guerrero    DOB: [birth year], 68 y.o. female   MRN: [id number]  CC: back pain   HPI: Kimberly Guerrero is a 68 y.o. female who presents to clinic today for the following issue.   Back pain  Chronic.  No acute worsening, she has been taking ibuprofen for her back.   She has a history of arthritis and is able to walk short distances, does not use walker or cane for assistance.   No numbness or tingling.  No h/o recent falls or trauma.  No bowel or bladder incontinence.   Health maintenance: -due for flu shot today   ROS: No fever, chills, nausea, vomiting.  No CP, SOB, palpitations.  No numbness or tingling.    Social: pt is a never smoker.  Medications reviewed. Objective:   BP 98/62   Pulse 62   Temp 98.6 F (37 C) (Oral)   Ht 5\' 9"  (1.753 m)   Wt 147 lb (66.7 kg)   SpO2 98%   BMI 21.71 kg/m  Vitals and nursing note reviewed.  General: 68 year old female, NAD Neck: supple, normal range of motion CV: RRR no MRG  Lungs: CTAB, normal effort  Abdomen: soft, NTND, +bs  Skin: warm, dry, no rash Extremities: warm and well perfused, normal tone MSK:- Lumbar spine: No obvious bony deformities, range of motion is normal with flexion and extension, no tenderness over lumbar vertebrae or paraspinal musculature, negative straight leg test, normal sensation, normal gait Neuro: alert, no focal deficits  Assessment & Plan:   OA of facet joint of lumbar spine  Significant arthritic changes in her back causing pain.  No acute worsening at this time.  No red flags on exam. -Rx: will trial Mobic 7.5 mg daily, may consider increasing to 15 mg if needed  Advised to continue heating pad and gentle range of motion exercises for her lower back Follow up: prn   Health maintenance:  Flu shot given today  -fecal occult blood ordered   Orders Placed This Encounter  Procedures  . Fecal occult blood, imunochemical([facility])    Standing Status:   Future      Standing Expiration Date:   02/25/2019  . Flu Vaccine QUAD 36+ mos IM  . HgB A1c   Meds ordered this encounter  Medications  . DISCONTD: meloxicam (MOBIC) 7.5 MG tablet    Sig: Take 1 tablet (7.5 mg total) by mouth daily.    Dispense:  30 tablet    Refill:  1   Lovenia Kim, MD [place] PGY-3

## 2018-02-24 NOTE — Patient Instructions (Signed)
It was nice seeing you again today.  You were seen in clinic for routine physical and discussing health maintenance issues.  We screened you for diabetes today.  Additionally, I have sent in a new medication called Mobic to your pharmacy for you to try for your back pain.  As we discussed, I would like you to continue heat and massage along with gentle range of motion exercises as these will prevent muscle spasm.  Please call clinic if you have any questions.

## 2018-02-26 DIAGNOSIS — Z1211 Encounter for screening for malignant neoplasm of colon: Secondary | ICD-10-CM | POA: Diagnosis not present

## 2018-02-26 NOTE — Addendum Note (Signed)
Addended by: Francene Castle on: 02/26/2018 01:36 PM   Modules accepted: Orders

## 2018-03-01 LAB — FECAL OCCULT BLOOD, IMMUNOCHEMICAL: Fecal Occult Bld: NEGATIVE

## 2018-03-08 ENCOUNTER — Telehealth: Payer: Self-pay | Admitting: Family Medicine

## 2018-03-08 NOTE — Telephone Encounter (Signed)
Pt would like for someone to call her with the results from her last appointment.

## 2018-03-15 NOTE — Telephone Encounter (Signed)
Called patient to inform her of negative FOBT, she was appreciative of the call

## 2018-04-08 ENCOUNTER — Telehealth: Payer: Self-pay

## 2018-04-08 NOTE — Telephone Encounter (Signed)
She will need an appointment as it has been since August THANKS! Kimberly Guerrero

## 2018-04-08 NOTE — Telephone Encounter (Signed)
Does she need an appt or can you send in a refill?

## 2018-04-26 ENCOUNTER — Other Ambulatory Visit: Payer: Self-pay | Admitting: Family Medicine

## 2018-04-26 DIAGNOSIS — T148XXA Other injury of unspecified body region, initial encounter: Secondary | ICD-10-CM

## 2018-05-19 ENCOUNTER — Other Ambulatory Visit: Payer: Self-pay | Admitting: Family Medicine

## 2018-06-12 DIAGNOSIS — M65312 Trigger thumb, left thumb: Secondary | ICD-10-CM | POA: Diagnosis not present

## 2018-06-13 DIAGNOSIS — M65312 Trigger thumb, left thumb: Secondary | ICD-10-CM | POA: Diagnosis not present

## 2018-06-23 ENCOUNTER — Encounter: Payer: Medicare Other | Admitting: Family Medicine

## 2018-07-23 ENCOUNTER — Other Ambulatory Visit: Payer: Self-pay | Admitting: Family Medicine

## 2018-07-23 DIAGNOSIS — T148XXA Other injury of unspecified body region, initial encounter: Secondary | ICD-10-CM

## 2018-08-02 ENCOUNTER — Other Ambulatory Visit: Payer: Self-pay

## 2018-08-02 ENCOUNTER — Ambulatory Visit (INDEPENDENT_AMBULATORY_CARE_PROVIDER_SITE_OTHER): Payer: Medicare Other

## 2018-08-02 VITALS — Ht 69.0 in | Wt 145.0 lb

## 2018-08-02 DIAGNOSIS — Z0000 Encounter for general adult medical examination without abnormal findings: Secondary | ICD-10-CM | POA: Diagnosis not present

## 2018-08-02 NOTE — Progress Notes (Addendum)
Subjective:   Kimberly Guerrero is a 68 y.o. female who presents for initial Medicare Annual preventive examination.  The patient consented to a virtual visit.  Review of Systems: Defer to PCP  Cardiac Risk Factors include: advanced age (>12men, >24 women)     Objective:     Vitals: Ht 5\' 9"  (1.753 m)   Wt 145 lb (65.8 kg)   BMI 21.41 kg/m   Body mass index is 21.41 kg/m.  Advanced Directives 08/02/2018 02/24/2018 09/08/2016 04/22/2016 02/07/2016 12/27/2015 12/14/2015  Does Patient Have a Medical Advance Directive? No No No No No No No  Would patient like information on creating a medical advance directive? Yes (MAU/Ambulatory/Procedural Areas - Information given) No - Patient declined No - Patient declined No - Patient declined No - Patient declined No - Patient declined No - patient declined information    Tobacco Social History   Tobacco Use  Smoking Status Never Smoker  Smokeless Tobacco Never Used     Clinical Intake:  Pre-visit preparation completed: Yes  Pain Score: 6  Chronic back pain    How often do you need to have someone help you when you read instructions, pamphlets, or other written materials from your doctor or pharmacy?: 2 - Rarely What is the last grade level you completed in school?: high school  Interpreter Needed?: No  Past Medical History:  Diagnosis Date  . Back pain    Chronic back pain   . Chronic fatigue   . Osteoarthritis    No past surgical history on file. Family History  Problem Relation Age of Onset  . Breast cancer Neg Hx    Social History   Socioeconomic History  . Marital status: Single    Spouse name: Not on file  . Number of children: Not on file  . Years of education: 30  . Highest education level: High school graduate  Occupational History  . 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 Financial resource strain: Not hard at all  . Food insecurity    Worry: Never true    Inability: Never true   . Transportation needs    Medical: No    Non-medical: No  Tobacco Use  . Smoking status: Never Smoker  . Smokeless tobacco: Never Used  Substance and Sexual Activity  . Alcohol use: No    Alcohol/week: 0.0 standard drinks  . Drug use: No  . Sexual activity: Not Currently  Lifestyle  . Physical activity    Days per week: 7 days    Minutes per session: 10 min  . Stress: Only a little  Relationships  . Social Herbalist on phone: More than three times a week    Gets together: Twice a week    Attends religious service: Never    Active member of club or organization: No    Attends meetings of clubs or organizations: Never    Relationship status: Never married  Other Topics Concern  . Not on file  Social History Narrative   Patient lives alone here in [place].    Patient still drives herself to run errands and medical apts.    Patient is close with her daughter Lenna Sciara.    Patient practices Yoga daily to help with her chronic pain.    Patient enjoys reading and cooking.     Outpatient Encounter Medications as of 08/02/2018  Medication Sig  . atorvastatin (LIPITOR) 40 MG tablet  TAKE 1 TABLET(40 MG) BY MOUTH DAILY  . Baclofen 5 MG TABS TAKE 1 TABLET BY MOUTH THREE TIMES DAILY AS NEEDED FOR MUSCLE SPASMS  . cetirizine (ZYRTEC) 10 MG tablet Take 1 tablet (10 mg total) by mouth daily.  . fluticasone (FLONASE) 50 MCG/ACT nasal spray SHAKE LIQUID AND USE 2 SPRAYS IN EACH NOSTRIL DAILY AS NEEDED FOR ALLERGIES OR RHINITIS  . ibuprofen (ADVIL,MOTRIN) 800 MG tablet Take 1 tablet (800 mg total) by mouth daily as needed.  . [DISCONTINUED] Baclofen 5 MG TABS TAKE 1 TABLET BY MOUTH THREE TIMES DAILY AS NEEDED FOR MUSCLE SPASM  . meloxicam (MOBIC) 7.5 MG tablet TAKE 1 TABLET(7.5 MG) BY MOUTH DAILY (Patient not taking: Reported on 08/02/2018)  . methocarbamol (ROBAXIN) 500 MG tablet Can take 1 500mg  tablet as needed for muscle spasm. Can take up to 4 times per day. (Patient not taking:  Reported on 08/28/2017)  . mometasone (NASONEX) 50 MCG/ACT nasal spray Place 2 sprays into the nose daily. (Patient not taking: Reported on 08/28/2017)  . predniSONE (DELTASONE) 10 MG tablet Take by mouth 6 a day 1 week, 4 a day 1 week, 2 a day 1 week and one a day 1 week (Patient not taking: Reported on 08/02/2018)   No facility-administered encounter medications on file as of 08/02/2018.     Activities of Daily Living In your present state of health, do you have any difficulty performing the following activities: 08/02/2018  Hearing? N  Vision? Y  Comment wears glases  Difficulty concentrating or making decisions? N  Walking or climbing stairs? N  Dressing or bathing? N  Doing errands, shopping? N  Preparing Food and eating ? N  Using the Toilet? N  In the past six months, have you accidently leaked urine? N  Do you have problems with loss of bowel control? N  Managing your Medications? N  Managing your Finances? N  Housekeeping or managing your Housekeeping? N  Some recent data might be hidden    Patient Care Team: Danna Hefty, DO as PCP - General (Family Medicine)    Assessment:   This is a routine wellness examination for Kimberly Guerrero.  Exercise Activities and Dietary recommendations Current Exercise Habits: Home exercise routine, Type of exercise: yoga, Time (Minutes): 20, Frequency (Times/Week): 7, Weekly Exercise (Minutes/Week): 140, Exercise limited by: orthopedic condition(s)  Goals    . DIET - INCREASE WATER INTAKE       Fall Risk Fall Risk  08/02/2018 02/24/2018 09/08/2016 04/22/2016 02/07/2016  Falls in the past year? 0 0 No No No   Is the patient's home free of loose throw rugs in walkways, pet beds, electrical cords, etc?   yes      Grab bars in the bathroom? yes      Handrails on the stairs?   yes      Adequate lighting?   yes  Patient rating of health (0-10) scale: 7  Depression Screen PHQ 2/9 Scores 08/02/2018 02/24/2018 09/08/2016 04/22/2016  PHQ - 2 Score 0 0 0 0   Exception Documentation - - - -     Cognitive Function   6CIT Screen 08/02/2018  What Year? 0 points  What month? 0 points  What time? 0 points  Count back from 20 0 points  Months in reverse 0 points  Repeat phrase 0 points  Total Score 0    Immunization History  Administered Date(s) Administered  . Influenza,inj,Quad PF,6+ Mos 02/19/2015, 10/24/2015, 12/01/2016, 02/24/2018  . Pneumococcal Conjugate-13 04/22/2016  Screening Tests Health Maintenance  Topic Date Due  . COLONOSCOPY  11/07/2000  . PNA vac Low Risk Adult (2 of 2 - PPSV23) 04/22/2017  . INFLUENZA VACCINE  08/28/2018  . MAMMOGRAM  10/24/2019  . TETANUS/TDAP  06/05/2021  . DEXA SCAN  Completed  . Hepatitis C Screening  Completed    Cancer Screenings: Lung: Low Dose CT Chest recommended if Age 4-80 years, 30 pack-year currently smoking OR have quit w/in 15years. Patient does not qualify. Breast:  Up to date on Mammogram? Yes   Up to date of Bone Density/Dexa? Yes Colorectal: Has never had one, routinely does stool cards, recent negative 2019.  Additional Screenings: Hepatitis C Screening: 2017     Plan:   I have mailed you an Advanced Directive packet. Please look over this with your daughter. Once completed you can return to our office.   I have personally reviewed and noted the following in the patient's chart:   . Medical and social history . Use of alcohol, tobacco or illicit drugs  . Current medications and supplements . Functional ability and status . Nutritional status . Physical activity . Advanced directives . List of other physicians . Hospitalizations, surgeries, and ER visits in previous 12 months . Vitals . Screenings to include cognitive, depression, and falls . Referrals and appointments  In addition, I have reviewed and discussed with patient certain preventive protocols, quality metrics, and best practice recommendations. A written personalized care plan for preventive services  as well as general preventive health recommendations were provided to patient.    This visit was conducted virtually in the setting of the [place] pandemic.    [place]  08/02/2018   Attestation: I have reviewed this visit and agree with the documentation.   [name], [place], PGY2

## 2018-08-02 NOTE — Patient Instructions (Addendum)
You spoke to Kimberly Guerrero, [name] over the phone for your annual wellness visit.  We discussed goals:  Goals    . DIET - INCREASE WATER INTAKE       We also discussed recommended health maintenance. Please call our office and schedule a visit. As discussed, you are due for your annual flu vaccine in August, you can discuss your next PNA vaccine at this visit. You will be able to meet your new PCP, [name], as well. Continue doing stool cards if you are not interested in getting a colonoscopy.   Health Maintenance  Topic Date Due  . COLONOSCOPY  11/07/2000  . PNA vac Low Risk Adult (2 of 2 - PPSV23) 04/22/2017  . INFLUENZA VACCINE  08/28/2018  . MAMMOGRAM  10/24/2019  . TETANUS/TDAP  06/05/2021  . DEXA SCAN  Completed  . Hepatitis C Screening  Completed    We also discussed your recent A1C and labs, which all were within normal limits. I have enclosed ways to help lower your A1C, since you have been "prediabetic" in the past.   Bring the completed advanced directive packet with you to your next office visit.   Take Care!    Prediabetes Prediabetes is the condition of having a blood sugar (blood glucose) level that is higher than it should be, but not high enough for you to be diagnosed with type 2 diabetes. Having prediabetes puts you at risk for developing type 2 diabetes (type 2 diabetes mellitus). Prediabetes may be called impaired glucose tolerance or impaired fasting glucose. Prediabetes usually does not cause symptoms. Your health care provider can diagnose this condition with blood tests. You may be tested for prediabetes if you are overweight and if you have at least one other risk factor for prediabetes. What is blood glucose, and how is it measured? Blood glucose refers to the amount of glucose in your bloodstream. Glucose comes from eating foods that contain sugars and starches (carbohydrates), which the body breaks down into glucose. Your blood glucose level may be  measured in mg/dL (milligrams per deciliter) or mmol/L (millimoles per liter). Your blood glucose may be checked with one or more of the following blood tests:  A fasting blood glucose (FBG) test. You will not be allowed to eat (you will fast) for 8 hours or longer before a blood sample is taken. ? A normal range for FBG is 70-100 mg/dl (3.9-5.6 mmol/L).  An A1c (hemoglobin A1c) blood test. This test provides information about blood glucose control over the previous 2?32months.  An oral glucose tolerance test (OGTT). This test measures your blood glucose at two times: ? After fasting. This is your baseline level. ? Two hours after you drink a beverage that contains glucose. You may be diagnosed with prediabetes:  If your FBG is 100?125 mg/dL (5.6-6.9 mmol/L).  If your A1c level is 5.7?6.4%.  If your OGTT result is 140?199 mg/dL (7.8-11 mmol/L). These blood tests may be repeated to confirm your diagnosis. How can this condition affect me? The pancreas produces a hormone (insulin) that helps to move glucose from the bloodstream into cells. When cells in the body do not respond properly to insulin that the body makes (insulin resistance), excess glucose builds up in the blood instead of going into cells. As a result, high blood glucose (hyperglycemia) can develop, which can cause many complications. Hyperglycemia is a symptom of prediabetes. Having high blood glucose for a long time is dangerous. Too much glucose in your blood  can damage your nerves and blood vessels. Long-term damage can lead to complications from diabetes, which may include:  Heart disease.  Stroke.  Blindness.  Kidney disease.  Depression.  Poor circulation in the feet and legs, which could lead to surgical removal (amputation) in severe cases. What can increase my risk? Risk factors for prediabetes include:  Having a family member with type 2 diabetes.  Being overweight or obese.  Being older than age  47.  Being of American [place], African-American, Hispanic/Latino, or Asian/Pacific Islander descent.  Having an inactive (sedentary) lifestyle.  Having a history of heart disease.  History of gestational diabetes or polycystic ovary syndrome (PCOS), in women.  Having low levels of good cholesterol (HDL-C) or high levels of blood fats (triglycerides).  Having high blood pressure. What actions can I take to prevent diabetes?      Be physically active. ? Do moderate-intensity physical activity for 30 or more minutes on 5 or more days of the week, or as much as told by your health care provider. This could be brisk walking, biking, or water aerobics. ? Ask your health care provider what activities are safe for you. A mix of physical activities may be best, such as walking, swimming, cycling, and strength training.  Lose weight as told by your health care provider. ? Losing 5-7% of your body weight can reverse insulin resistance. ? Your health care provider can determine how much weight loss is best for you and can help you lose weight safely.  Follow a healthy meal plan. This includes eating lean proteins, complex carbohydrates, fresh fruits and vegetables, low-fat dairy products, and healthy fats. ? Follow instructions from your health care provider about eating or drinking restrictions. ? Make an appointment to see a diet and nutrition specialist (registered dietitian) to help you create a healthy eating plan that is right for you.  Do not smoke or use any tobacco products, such as cigarettes, chewing tobacco, and e-cigarettes. If you need help quitting, ask your health care provider.  Take over-the-counter and prescription medicines as told by your health care provider. You may be prescribed medicines that help lower the risk of type 2 diabetes.  Keep all follow-up visits as told by your health care provider. This is important. Summary  Prediabetes is the condition of having a  blood sugar (blood glucose) level that is higher than it should be, but not high enough for you to be diagnosed with type 2 diabetes.  Having prediabetes puts you at risk for developing type 2 diabetes (type 2 diabetes mellitus).  To help prevent type 2 diabetes, make lifestyle changes such as being physically active and eating a healthy diet. Lose weight as told by your health care provider. This information is not intended to replace advice given to you by your health care provider. Make sure you discuss any questions you have with your health care provider. Document Released: 05/07/2015 Document Revised: 05/07/2018 Document Reviewed: 03/06/2015 Elsevier Patient Education  [place].  Here is an example of what a healthy plate looks like:    ? Make half your plate fruits and vegetables.     ? Focus on whole fruits.     ? Vary your veggies.  ? Make half your grains whole grains. -     ? Look for the word "whole" at the beginning of the ingredients list    ? Some whole-grain ingredients include whole oats, whole-wheat flour,        whole-grain  corn, whole-grain brown rice, and whole rye.  ? Move to low-fat and fat-free milk or yogurt.  ? Vary your protein routine. - Meat, fish, poultry (chicken, [place]), eggs, beans (kidney, pinto), dairy.  ? Drink and eat less sodium, saturated fat, and added sugars.     Our clinic's number is [phone]. Please call with questions or concerns about what we discussed today.

## 2018-08-04 NOTE — Addendum Note (Signed)
Addended by: Danna Hefty on: 08/04/2018 04:22 PM   Modules accepted: Orders

## 2018-08-10 ENCOUNTER — Other Ambulatory Visit: Payer: Self-pay | Admitting: Family Medicine

## 2018-08-10 NOTE — Telephone Encounter (Signed)
Pt called for a refill on her medication atorvastatin, pt states shw will run out by this Friday. Please give pt a call back.

## 2018-09-06 ENCOUNTER — Other Ambulatory Visit: Payer: Self-pay | Admitting: *Deleted

## 2018-09-07 MED ORDER — CETIRIZINE HCL 10 MG PO TABS: 10.0000 mg | ORAL_TABLET | Freq: Every day | ORAL | 11 refills | Status: DC

## 2018-09-16 ENCOUNTER — Other Ambulatory Visit: Payer: Self-pay | Admitting: Family Medicine

## 2018-09-16 DIAGNOSIS — Z1231 Encounter for screening mammogram for malignant neoplasm of breast: Secondary | ICD-10-CM

## 2018-09-25 DIAGNOSIS — M25552 Pain in left hip: Secondary | ICD-10-CM | POA: Diagnosis not present

## 2018-09-25 DIAGNOSIS — M5136 Other intervertebral disc degeneration, lumbar region: Secondary | ICD-10-CM | POA: Diagnosis not present

## 2018-10-05 ENCOUNTER — Ambulatory Visit (INDEPENDENT_AMBULATORY_CARE_PROVIDER_SITE_OTHER): Payer: Medicare Other

## 2018-10-05 ENCOUNTER — Other Ambulatory Visit: Payer: Self-pay

## 2018-10-05 DIAGNOSIS — Z23 Encounter for immunization: Secondary | ICD-10-CM

## 2018-10-05 NOTE — Progress Notes (Signed)
Patient presents in nurse clinic for flu vaccine and PNA 23. Patient tolerated injections well.

## 2018-10-18 ENCOUNTER — Other Ambulatory Visit: Payer: Self-pay | Admitting: Family Medicine

## 2018-10-18 DIAGNOSIS — T148XXA Other injury of unspecified body region, initial encounter: Secondary | ICD-10-CM

## 2018-10-22 ENCOUNTER — Other Ambulatory Visit: Payer: Self-pay

## 2018-10-22 ENCOUNTER — Ambulatory Visit (INDEPENDENT_AMBULATORY_CARE_PROVIDER_SITE_OTHER): Payer: Medicare Other | Admitting: Family Medicine

## 2018-10-22 VITALS — BP 96/72 | Ht 69.0 in | Wt 148.0 lb

## 2018-10-22 DIAGNOSIS — M5416 Radiculopathy, lumbar region: Secondary | ICD-10-CM

## 2018-10-22 MED ORDER — IBUPROFEN 800 MG PO TABS: 800.0000 mg | ORAL_TABLET | Freq: Two times a day (BID) | ORAL | 2 refills | Status: DC | PRN

## 2018-10-22 MED ORDER — GABAPENTIN 100 MG PO CAPS: 100.0000 mg | ORAL_CAPSULE | Freq: Every day | ORAL | 3 refills | Status: DC

## 2018-10-22 NOTE — Progress Notes (Signed)
  [facility]: Attending Note: I have reviewed the chart, discussed wit the Sports Medicine Fellow. I agree with assessment and treatment plan as detailed in the [place] note. With her recurrent pain, I  the think we need an MRI and she agrees to get that.  We will start her on very low-dose gabapentin and see her back after MRI.  We can also increase her ibuprofen to twice daily for right now.  Creatinine clearance 1.3.  CKD 3.  I would not want to keep her on long-term higher dose ibuprofen.

## 2018-10-22 NOTE — Progress Notes (Signed)
  Kimberly Guerrero - 68 y.o. female MRN [id number]  Date of birth: 04/17/1950  SUBJECTIVE:   CC: lower back pain with left leg pain   68 yo female presenting with chronic lower back pain with sciatica down left leg to mid thigh. She has had lower back pain for years but sciatica started several months ago. Describes the pain as dull and achy. Unable to sleep on left side. She is currently taking tylenol, ibuprofen, and baclofen for pain. Feels like this is only giving her mild relief. She walks daily - 1.5- 2 miles. She has done PT in the past and feels like it aggravated her back. She presented to [facility] on August 29th and had x-rays of lumbar spine which she brought with her today. She also reports getting 2 back injections (likely SI joint injections) and a prednisone dose pack. These helped for weeks but have now worn off.   ROS: No unexpected weight loss, fever, chills, swelling, instability, muscle pain, numbness/tingling, redness, otherwise see HPI   PMHx - Updated and reviewed.  Contributory factors include: Negative PSHx - Updated and reviewed.  Contributory factors include:  Negative FHx - Updated and reviewed.  Contributory factors include:  Negative Social Hx - Updated and reviewed. Contributory factors include: Negative Medications - reviewed   DATA REVIEWED: Personally reviewed X-rays brought on CD- showed mild scoliosis and arthritis at L5-S1  PHYSICAL EXAM:  VS: BP:96/72  HR: bpm  TEMP: ( )  RESP:   HT:5\' 9"  (175.3 cm)   WT:148 lb (67.1 kg)  BMI:21.85 PHYSICAL EXAM: Gen: NAD, alert, cooperative with exam, well-appearing HEENT: clear conjunctiva,  CV:  no edema, capillary refill brisk, normal rate Resp: non-labored Skin: no rashes, normal turgor  Neuro: no gross deficits.  Psych:  alert and oriented  Lumbar spine: - Inspection: no gross deformity or asymmetry, swelling or ecchymosis - Palpation: TTP over the L5-S1 spinous processes. No TTP over  paraspinal muscles, or SI joints b/l - ROM: full active ROM of the lumbar spine in flexion and extension without pain - Strength: 5/5 strength of lower extremity in L4-S1 nerve root distributions b/l; normal gait - Neuro: sensation intact in the L4-S1 nerve root distribution b/l, 2+ L4 and S1 reflexes - Special testing: Negative straight leg raise, negative slump, negative Stork test, Negative FABER  ASSESSMENT & PLAN:  Lower back pain with radiculopathy- likely worsening facet arthritis. She has tried conservative measures (PT, ibuprofen/gabapentin, as well as SI joint injection) without improvement. Personally reviewed x-rays today that showed  L4-S1 facet arthritis  As well as lumbar scoliosis. Will start gabapentin 100 mg nightly to see if this improves neuropathy and will obtain MRI. If these measures do not work, will proceed with epidural steroid injections. Return in 1 month.

## 2018-10-22 NOTE — Patient Instructions (Signed)
You can increase your ibprofen to twice a day, still take tylenol AND I am adding a pill---gabapentin_---at night. Let's get the MRI and see you in one month! CALL if anything changes

## 2018-11-01 ENCOUNTER — Ambulatory Visit: Payer: Medicare Other

## 2018-11-05 ENCOUNTER — Ambulatory Visit
Admission: RE | Admit: 2018-11-05 | Discharge: 2018-11-05 | Disposition: A | Discharge status: Home / Self Care | Payer: Medicare Other | Source: Ambulatory Visit | Attending: Family Medicine | Admitting: Family Medicine

## 2018-11-05 ENCOUNTER — Other Ambulatory Visit: Payer: Self-pay

## 2018-11-05 DIAGNOSIS — M5416 Radiculopathy, lumbar region: Secondary | ICD-10-CM

## 2018-11-05 DIAGNOSIS — M48061 Spinal stenosis, lumbar region without neurogenic claudication: Secondary | ICD-10-CM | POA: Diagnosis not present

## 2018-11-09 ENCOUNTER — Telehealth: Payer: Self-pay | Admitting: *Deleted

## 2018-11-10 NOTE — Telephone Encounter (Signed)
Talked with patient about MRI results and what the plan would be going forward. She is going to thing about the [facility] referral and contact me back if she wants to got that route.

## 2018-11-10 NOTE — Telephone Encounter (Signed)
Kimberly Guerrero She has a LOT of arthritic changes and some disc issues which likely account for her pain. She MIGHT benefit from some steroid injections under fluoroscope bit I would like her to see a neurosurgeon for their opinion. Please apologize for me not calliing her earlier--somehow I did not see the MRI results. I appreciate her calling us --that is always my saftety net for results. I would recommend some pne at [facility] across the street. [facility]? [facility]?They are all good. If she wants to discuss personally with me I will when I get back next week, bu I REALLY want the input from a surgeon. Kimberly Guerrero

## 2018-12-09 ENCOUNTER — Other Ambulatory Visit: Payer: Self-pay

## 2018-12-09 ENCOUNTER — Ambulatory Visit
Admission: RE | Admit: 2018-12-09 | Discharge: 2018-12-09 | Disposition: A | Discharge status: Home / Self Care | Payer: Medicare Other | Source: Ambulatory Visit | Attending: *Deleted | Admitting: *Deleted

## 2018-12-09 DIAGNOSIS — Z1231 Encounter for screening mammogram for malignant neoplasm of breast: Secondary | ICD-10-CM

## 2018-12-30 ENCOUNTER — Other Ambulatory Visit: Payer: Self-pay

## 2018-12-30 DIAGNOSIS — Z20822 Contact with and (suspected) exposure to covid-19: Secondary | ICD-10-CM

## 2018-12-31 DIAGNOSIS — Z719 Counseling, unspecified: Secondary | ICD-10-CM | POA: Diagnosis not present

## 2018-12-31 LAB — NOVEL CORONAVIRUS, NAA: SARS-CoV-2, NAA: NOT DETECTED

## 2018-12-31 NOTE — Progress Notes (Signed)
Please inform patient of negative test result. Thank you.

## 2019-01-03 ENCOUNTER — Telehealth: Payer: Self-pay | Admitting: Family Medicine

## 2019-01-03 NOTE — Telephone Encounter (Signed)
-----   Message from Danna Hefty, [place] sent at 12/31/2018  7:55 PM EST ----- Please inform patient of negative test result. Thank you.

## 2019-01-03 NOTE — Telephone Encounter (Signed)
Patient called to receive her negative COVID test results.Patient expressed understanding. °

## 2019-01-03 NOTE — Telephone Encounter (Signed)
Pt informed. Darreon Lutes, CMA  

## 2019-01-31 ENCOUNTER — Telehealth: Payer: Self-pay

## 2019-01-31 ENCOUNTER — Other Ambulatory Visit: Payer: Self-pay | Admitting: Family Medicine

## 2019-01-31 DIAGNOSIS — M5416 Radiculopathy, lumbar region: Secondary | ICD-10-CM

## 2019-01-31 NOTE — Telephone Encounter (Signed)
Order placed

## 2019-02-02 ENCOUNTER — Ambulatory Visit
Admission: RE | Admit: 2019-02-02 | Discharge: 2019-02-02 | Disposition: A | Discharge status: Home / Self Care | Payer: Medicare Other | Source: Ambulatory Visit | Attending: Family Medicine | Admitting: Family Medicine

## 2019-02-02 ENCOUNTER — Other Ambulatory Visit: Payer: Self-pay

## 2019-02-02 DIAGNOSIS — M5416 Radiculopathy, lumbar region: Secondary | ICD-10-CM

## 2019-02-02 MED ORDER — IOPAMIDOL (ISOVUE-M 200) INJECTION 41%: 1.0000 mL | Freq: Once | INTRAMUSCULAR | Status: DC

## 2019-02-02 MED ORDER — METHYLPREDNISOLONE ACETATE 40 MG/ML INJ SUSP (RADIOLOG: 120.0000 mg | Freq: Once | INTRAMUSCULAR | Status: DC

## 2019-02-02 NOTE — Discharge Instructions (Signed)

## 2019-02-07 ENCOUNTER — Other Ambulatory Visit: Payer: Self-pay | Admitting: *Deleted

## 2019-02-08 ENCOUNTER — Other Ambulatory Visit: Payer: Self-pay

## 2019-02-08 NOTE — Telephone Encounter (Signed)
Opened in error.   Askari Kinley C Andrey Hoobler, RN  

## 2019-02-09 MED ORDER — ATORVASTATIN CALCIUM 40 MG PO TABS: 40.0000 mg | ORAL_TABLET | Freq: Every day | ORAL | 2 refills | Status: DC

## 2019-02-09 NOTE — Telephone Encounter (Signed)
Refill approved.

## 2019-02-09 NOTE — Telephone Encounter (Signed)
Pt is calling to check to check on the status of having her Atorvastatin refilled.

## 2019-02-10 ENCOUNTER — Other Ambulatory Visit: Payer: Self-pay

## 2019-02-10 MED ORDER — GABAPENTIN 100 MG PO CAPS: 100.0000 mg | ORAL_CAPSULE | Freq: Every day | ORAL | 3 refills | Status: DC

## 2019-03-08 ENCOUNTER — Other Ambulatory Visit: Payer: Self-pay | Admitting: *Deleted

## 2019-03-09 ENCOUNTER — Other Ambulatory Visit: Payer: Self-pay

## 2019-03-10 ENCOUNTER — Other Ambulatory Visit: Payer: Self-pay | Admitting: Sports Medicine

## 2019-03-10 DIAGNOSIS — M545 Low back pain, unspecified: Secondary | ICD-10-CM

## 2019-03-10 DIAGNOSIS — G8929 Other chronic pain: Secondary | ICD-10-CM

## 2019-03-10 DIAGNOSIS — M65312 Trigger thumb, left thumb: Secondary | ICD-10-CM | POA: Diagnosis not present

## 2019-03-16 DIAGNOSIS — H43811 Vitreous degeneration, right eye: Secondary | ICD-10-CM | POA: Diagnosis not present

## 2019-03-16 DIAGNOSIS — H2513 Age-related nuclear cataract, bilateral: Secondary | ICD-10-CM | POA: Diagnosis not present

## 2019-03-17 ENCOUNTER — Inpatient Hospital Stay: Admission: RE | Admit: 2019-03-17 | Payer: Medicare Other | Source: Ambulatory Visit

## 2019-03-22 ENCOUNTER — Ambulatory Visit
Admission: RE | Admit: 2019-03-22 | Discharge: 2019-03-22 | Disposition: A | Discharge status: Home / Self Care | Payer: Medicare Other | Source: Ambulatory Visit | Attending: Sports Medicine | Admitting: Sports Medicine

## 2019-03-22 DIAGNOSIS — G8929 Other chronic pain: Secondary | ICD-10-CM | POA: Diagnosis not present

## 2019-03-22 DIAGNOSIS — M545 Low back pain: Secondary | ICD-10-CM | POA: Diagnosis not present

## 2019-03-22 MED ORDER — METHYLPREDNISOLONE ACETATE 40 MG/ML INJ SUSP (RADIOLOG: 120.0000 mg | Freq: Once | INTRAMUSCULAR | Status: DC

## 2019-03-22 MED ORDER — IOPAMIDOL (ISOVUE-M 200) INJECTION 41%
1.0000 mL | Freq: Once | INTRAMUSCULAR | Status: AC
  Administered 2019-03-22: 12:00:00 1 mL via EPIDURAL

## 2019-03-22 MED ORDER — IOPAMIDOL (ISOVUE-M 200) INJECTION 41%: 1.0000 mL | Freq: Once | INTRAMUSCULAR | Status: DC

## 2019-03-22 MED ORDER — METHYLPREDNISOLONE ACETATE 40 MG/ML INJ SUSP (RADIOLOG
120.0000 mg | Freq: Once | INTRAMUSCULAR | Status: AC
  Administered 2019-03-22: 120 mg via EPIDURAL

## 2019-03-22 NOTE — Discharge Instructions (Signed)

## 2019-04-01 ENCOUNTER — Encounter: Payer: Self-pay | Admitting: Family Medicine

## 2019-04-01 ENCOUNTER — Other Ambulatory Visit: Payer: Self-pay

## 2019-04-01 ENCOUNTER — Ambulatory Visit (INDEPENDENT_AMBULATORY_CARE_PROVIDER_SITE_OTHER): Payer: Medicare Other | Admitting: Family Medicine

## 2019-04-01 VITALS — BP 118/60 | HR 52 | Wt 144.4 lb

## 2019-04-01 DIAGNOSIS — R7303 Prediabetes: Secondary | ICD-10-CM

## 2019-04-01 DIAGNOSIS — M85859 Other specified disorders of bone density and structure, unspecified thigh: Secondary | ICD-10-CM

## 2019-04-01 DIAGNOSIS — Z1211 Encounter for screening for malignant neoplasm of colon: Secondary | ICD-10-CM

## 2019-04-01 DIAGNOSIS — G4700 Insomnia, unspecified: Secondary | ICD-10-CM

## 2019-04-01 DIAGNOSIS — E7800 Pure hypercholesterolemia, unspecified: Secondary | ICD-10-CM | POA: Diagnosis not present

## 2019-04-01 DIAGNOSIS — Z0000 Encounter for general adult medical examination without abnormal findings: Secondary | ICD-10-CM | POA: Diagnosis not present

## 2019-04-01 DIAGNOSIS — M47816 Spondylosis without myelopathy or radiculopathy, lumbar region: Secondary | ICD-10-CM

## 2019-04-01 DIAGNOSIS — M1990 Unspecified osteoarthritis, unspecified site: Secondary | ICD-10-CM

## 2019-04-01 DIAGNOSIS — F331 Major depressive disorder, recurrent, moderate: Secondary | ICD-10-CM

## 2019-04-01 LAB — POCT GLYCOSYLATED HEMOGLOBIN (HGB A1C): HbA1c, POC (controlled diabetic range): 5.8 % (ref 0.0–7.0)

## 2019-04-01 MED ORDER — DULOXETINE HCL 30 MG PO CPEP: 30.0000 mg | ORAL_CAPSULE | Freq: Every day | ORAL | 3 refills | Status: DC

## 2019-04-01 NOTE — Progress Notes (Signed)
Subjective:   Patient ID: Kimberly Guerrero    DOB: 08-14-1950, 69 y.o. female   MRN: [id number]  Kimberly Guerrero is a 69 y.o. female with a history of allergic rhinitis, osteoarthritis, osteopenia, chronic low back pain, depression, HLD, and prediabetes  here for annual physical.   Prediabetes: Last three A1C's below. Managing with life style modifications. Denies any polyuria, polydipsia, polyphagia.  Lab Results  Component Value Date   HGBA1C 5.8 04/01/2019   HGBA1C 5.7 (A) 02/24/2018   HGBA1C 5.7 07/07/2017   HLD: Last lipid panel below. Currently on Atorvastatin 40mg  QD. Endorses compliance. Denies any muscles aches or weakness.  Lab Results  Component Value Date   CHOL 130 04/01/2019   HDL 85 04/01/2019   LDLCALC 34 04/01/2019   TRIG 42 04/01/2019   CHOLHDL 1.5 04/01/2019   Osteopenia: Last DEXA scan in 2018 noteable for -1.5 at femur neck. Currently on Calcium and Vitamin D supplement (she is unsure of the dosage).   Insomnia: Notes difficulty falling a sleep and staying a sleep at night occasionally. Uses Melatonin but doesn't find it helpful. She does note she has the TV on all night. She notes she exercises in the morning. Denies caffeine use.    Health Maintenance: Due for colon cancer screen   ROS: Patient reports no  vision/ hearing changes,anorexia, weight change, fever ,adenopathy, persistant / recurrent hoarseness, swallowing issues, chest pain, edema,persistant / recurrent cough, hemoptysis, dyspnea(rest, exertional, paroxysmal nocturnal), gastrointestinal  bleeding (melena, rectal bleeding), abdominal pain, excessive heart burn, GU symptoms(dysuria, hematuria, pyuria, voiding/incontinence  Issues) syncope, focal weakness, severe memory loss, concerning skin lesions, depression, anxiety, abnormal bruising/bleeding, major joint swelling, breast masses or abnormal vaginal bleeding.    [place], medications and smoking status reviewed.  Objective:   BP 118/60   Pulse  (!) 52   Wt 144 lb 6.4 oz (65.5 kg)   SpO2 98%   BMI 21.32 kg/m  Vitals and nursing note reviewed.  General: pleasant older female, sitting comfortably in exam chair, well nourished, well developed, in no acute distress with non-toxic appearance HEENT: normocephalic, atraumatic, moist mucous membranes, oropharynx clear without erythema or exudate Neck: supple, non-tender without lymphadenopathy, thyroid not enlarged, no nodules appreciated CV: regular rate and rhythm without murmurs, rubs, or gallops, no lower extremity edema Lungs: clear to auscultation bilaterally with normal work of breathing Abdomen: soft, non-tender, non-distended, normoactive bowel sounds Skin: warm, dry Extremities: warm and well perfused MSK: gait normal Neuro: Alert and oriented, speech normal  Assessment & Plan:   Annual Physical Patient presenting today for annual physical. Her primary concerns were her chronic low back pain and insomnia. Please see plan for each below.   Osteopenia Last DEXA scan in 2018 notable for Z score of -1.5 at femur neck - classified as moderate osteopenia. Currently on Calcium and Vitamin D. Recommend repeat DEXA scan in 3-5 years - will plan for 2022 or 2023, sooner if indicated.  - continue  dietary calcium (1200 mg/d) and vitamin D (800 IU daily)  Prediabetes A1C 5.8 today. Currently managing with lifestyle modifications. Has lost 4lbs since 09/2018.  - continue life style management with diet and exercise - Repeat A1C in 1 year, sooner if needed   - BMP with normal kidney function  Hyperlipemia Lipid panel obtained and WNL. Continue Atorvastatin 40mg  QD.   Osteoarthritis of facet joint of lumbar spine Worsening low back pain that has responded well to steroid injections. She notes she has days that pain  is much more severe and debilitating. OTC Tylenol and Ibuprofen are only minimally effective. Do not advice starting opioid for chronic pain management. Will do trial of  Cymbalta 30mg  QD. Can increase to 60mg  in 6-8 weeks if limited improvement. Can also transition to Nortriptyline if patient fails to see benefit with Cymbalta. BMP obtained to monitor kidney function given daily use of ibuprofen. Manage acute pain with back stretches, OTC tylenol/ibuprofen, but patient was cautioned to try to limit use of Ibuprofen due to GI and kidney effects. She may also treat with topical analgesics such as icey-hot, voltaren gel, salonpas, capsaicin. She may schedule follow up appointment with Dr. Nori Riis for additional lumbar ICS when desires. She understood and agreed to plan.   Insomnia The problem of insomnia was discussed. Sleep hygiene issues were reviewed. Recommended improving sleep hygiene practices and using OTC Melatonin PRN. If continues to suffer from insomnia, then can trial PRN Mirtazapine.  Patient understood and agreed to plan.  - Patient instructed to go to bed at the same time each night and get up at the same time each morning, including on the weekends (7 1/2 to 8 1/2 hours for the average adult) - Make sure bedroom is quiet, dark, relaxing, and at a comfortable temperature - Remove electronic devices, such as TVs, computers, and smart phones, from the bedroom. Try not to use or play on electronic devices 1-hour prior to bedtime.  - Avoid large meals, caffeine, and alcohol before bedtime (at least 2 hours before bedtime).  - Patient recommended to exercise daily, but avoid exercise 2 hours prior to bedtime - For stress relief, try meditation, deep breathing exercises (there are many books and CDs available), a white noise machine or fan can help to diffuse other noise distractors, such as traffic noise. - Avoid narcotic pain medication close to bedtime, as opioids/narcotics can suppress breathing drive and breathing effort. - Melatonin PRN for insomnia  Health Maintenance: Annual FOBT order placed. Patient instructed to bring sample to lab when completed. She  understood and agreed to plan.   Orders Placed This Encounter  Procedures  . Fecal occult blood, imunochemical([facility])  . Lipid panel  . Basic metabolic panel  . POCT glycosylated hemoglobin (Hb A1C)   Meds ordered this encounter  Medications  . DULoxetine (CYMBALTA) 30 MG capsule    Sig: Take 1 capsule (30 mg total) by mouth daily.    Dispense:  30 capsule    Refill:  [place], DO PGY-2, [facility] 04/04/2019 9:53 AM

## 2019-04-01 NOTE — Patient Instructions (Signed)
Thank you for coming to see me today. It was a pleasure to see you.   Make sure to take adequate intake of dietary calcium (1200 mg/d) and vitamin D (800 IU daily) for bone health.  Please start taking Duloxetine 30mg : 1 pill every day.   Work on sleep hygiene which no screen for 1 hour prior to bed, avoid caffeine after 2pm, or exercising late at night. Take Melatonin as needed. If no improvement in sleep 1-2 weeks, let me know and we will re-evaluate.  Please follow-up with me in 1 year for your annual exam.   If you have any questions or concerns, please do not hesitate to call the office at [phone].  Take Care,   Dr. [name], DO Resident Physician [place] [phone]

## 2019-04-02 LAB — LIPID PANEL
Chol/HDL Ratio: 1.5 ratio (ref 0.0–4.4)
Cholesterol, Total: 130 mg/dL (ref 100–199)
HDL: 85 mg/dL (ref >39)
LDL Chol Calc (NIH): 34 mg/dL (ref 0–99)
Triglycerides: 42 mg/dL (ref 0–149)
VLDL Cholesterol Cal: 11 mg/dL (ref 5–40)

## 2019-04-02 LAB — BASIC METABOLIC PANEL
BUN/Creatinine Ratio: 34 — ABNORMAL HIGH (ref 12–28)
BUN: 27 mg/dL (ref 8–27)
CO2: 22 mmol/L (ref 20–29)
Calcium: 9.8 mg/dL (ref 8.7–10.3)
Chloride: 105 mmol/L (ref 96–106)
Creatinine, Ser: 0.79 mg/dL (ref 0.57–1.00)
GFR calc Af Amer: 89 mL/min/{1.73_m2} (ref >59)
GFR calc non Af Amer: 77 mL/min/{1.73_m2} (ref >59)
Glucose: 96 mg/dL (ref 65–99)
Potassium: 4.3 mmol/L (ref 3.5–5.2)
Sodium: 141 mmol/L (ref 134–144)

## 2019-04-04 DIAGNOSIS — Z1211 Encounter for screening for malignant neoplasm of colon: Secondary | ICD-10-CM | POA: Diagnosis not present

## 2019-04-04 NOTE — Assessment & Plan Note (Signed)
Lipid panel obtained and WNL. Continue Atorvastatin 40mg  QD.

## 2019-04-04 NOTE — Assessment & Plan Note (Addendum)
Worsening low back pain that has responded well to steroid injections. She notes she has days that pain is much more severe and debilitating. OTC Tylenol and Ibuprofen are only minimally effective. Do not advice starting opioid for chronic pain management. Will do trial of Cymbalta 30mg  QD. Can increase to 60mg  in 6-8 weeks if limited improvement. Can also transition to Nortriptyline if patient fails to see benefit with Cymbalta. BMP obtained to monitor kidney function given daily use of ibuprofen. Manage acute pain with back stretches, OTC tylenol/ibuprofen, but patient was cautioned to try to limit use of Ibuprofen due to GI and kidney effects. She may also treat with topical analgesics such as icey-hot, voltaren gel, salonpas, capsaicin. She may schedule follow up appointment with Dr. Nori Riis for additional lumbar ICS when desires. She understood and agreed to plan.

## 2019-04-04 NOTE — Assessment & Plan Note (Addendum)
A1C 5.8 today. Currently managing with lifestyle modifications. Has lost 4lbs since 09/2018.  - continue life style management with diet and exercise - Repeat A1C in 1 year, sooner if needed   - BMP with normal kidney function

## 2019-04-04 NOTE — Assessment & Plan Note (Addendum)
The problem of insomnia was discussed. Sleep hygiene issues were reviewed. Recommended improving sleep hygiene practices and using OTC Melatonin PRN. If continues to suffer from insomnia, then can trial PRN Mirtazapine.  Patient understood and agreed to plan.  - Patient instructed to go to bed at the same time each night and get up at the same time each morning, including on the weekends (7 1/2 to 8 1/2 hours for the average adult) - Make sure bedroom is quiet, dark, relaxing, and at a comfortable temperature - Remove electronic devices, such as TVs, computers, and smart phones, from the bedroom. Try not to use or play on electronic devices 1-hour prior to bedtime.  - Avoid large meals, caffeine, and alcohol before bedtime (at least 2 hours before bedtime).  - Patient recommended to exercise daily, but avoid exercise 2 hours prior to bedtime - For stress relief, try meditation, deep breathing exercises (there are many books and CDs available), a white noise machine or fan can help to diffuse other noise distractors, such as traffic noise. - Avoid narcotic pain medication close to bedtime, as opioids/narcotics can suppress breathing drive and breathing effort. - Melatonin PRN for insomnia

## 2019-04-04 NOTE — Assessment & Plan Note (Addendum)
Last DEXA scan in 2018 notable for Z score of -1.5 at femur neck - classified as moderate osteopenia. Currently on Calcium and Vitamin D. Recommend repeat DEXA scan in 3-5 years - will plan for 2022 or 2023, sooner if indicated.  - continue  dietary calcium (1200 mg/d) and vitamin D (800 IU daily)

## 2019-04-06 LAB — FECAL OCCULT BLOOD, IMMUNOCHEMICAL: Fecal Occult Bld: NEGATIVE

## 2019-04-07 ENCOUNTER — Encounter: Payer: Self-pay | Admitting: Family Medicine

## 2019-04-13 DIAGNOSIS — H43811 Vitreous degeneration, right eye: Secondary | ICD-10-CM | POA: Diagnosis not present

## 2019-04-13 DIAGNOSIS — H2513 Age-related nuclear cataract, bilateral: Secondary | ICD-10-CM | POA: Diagnosis not present

## 2019-04-27 ENCOUNTER — Other Ambulatory Visit: Payer: Self-pay

## 2019-04-27 MED ORDER — IBUPROFEN 800 MG PO TABS: 800.0000 mg | ORAL_TABLET | Freq: Two times a day (BID) | ORAL | 2 refills | Status: DC | PRN

## 2019-04-27 MED ORDER — BACLOFEN 5 MG PO TABS: 5.0000 mg | ORAL_TABLET | Freq: Three times a day (TID) | ORAL | 1 refills | Status: DC

## 2019-04-27 NOTE — Telephone Encounter (Signed)
Patient calls nurse line requesting a refill on Ibuprofen 800mg . Patient also stated she does not like Cymbalta, she does not like the way it makes her feel. Patient is requesting to go back to Baclofen. Please advise.

## 2019-05-06 ENCOUNTER — Ambulatory Visit (INDEPENDENT_AMBULATORY_CARE_PROVIDER_SITE_OTHER): Payer: Medicare Other | Admitting: Family Medicine

## 2019-05-06 ENCOUNTER — Other Ambulatory Visit: Payer: Self-pay

## 2019-05-06 DIAGNOSIS — M47816 Spondylosis without myelopathy or radiculopathy, lumbar region: Secondary | ICD-10-CM | POA: Diagnosis not present

## 2019-05-06 MED ORDER — GABAPENTIN 100 MG PO CAPS: 100.0000 mg | ORAL_CAPSULE | Freq: Three times a day (TID) | ORAL | 11 refills | Status: DC

## 2019-05-06 NOTE — Patient Instructions (Signed)
It was nice seeing you today Ms. Ronnald Ramp!  Please stop taking the baclofen and duloxetine, and you can take gabapentin 2-3 times per day.  Your prescription has been called into your pharmacy.  Please follow-up with me in about 1 month.  If you have any questions or concerns, please feel free to call the clinic.   Be well,  Dr. Nori Riis

## 2019-05-06 NOTE — Assessment & Plan Note (Signed)
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  Since gabapentin is helpful for her at only 100 mg daily, we can increase the frequency of this medication to 3 times daily.  Patient was told that this is still a very low dose of this medication, so we have further room to increase her dose as needed.

## 2019-05-06 NOTE — Progress Notes (Signed)
    SUBJECTIVE:   CHIEF COMPLAINT / HPI:   Radicular back pain Kimberly Guerrero is here for follow up of her lower back pain.  She reports that her pain radiates to the posterior L thigh, which is relatively new.  She says that she has more pain at night and will have pain when first waking up.  Her pain is also exacerbated when she be bends forward frequently.  She says that ibuprofen, baclofen, and the epidural injections have not been significantly helpful.  However, gabapentin is quite helpful for her.  She has been taking it once a day and would like to take it more frequently if that is possible.  PERTINENT  PMH / PSH: Osteoarthritis, chronic right SI joint pain, insomnia  OBJECTIVE:   BP 106/78   Ht 5\' 9"  (1.753 m)   Wt 145 lb (65.8 kg)   BMI 21.41 kg/m   General: well appearing, appears stated age   Back - Normal skin, Spine with normal alignment and no deformity.  No tenderness to vertebral process palpation.  Paraspinous muscles are not tender and without spasm.   Range of motion is full at lumbar sacral regions.  Straight leg testing negative bilaterally.  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  Since gabapentin is helpful for her at only 100 mg daily, we can increase the frequency of this medication to 3 times daily.  Patient was told that this is still a very low dose of this medication, so we have further room to increase her dose as needed.   Follow up in 4 weeks.  Kathrene Alu, MD [place]

## 2019-05-09 ENCOUNTER — Telehealth: Payer: Self-pay

## 2019-05-09 NOTE — Telephone Encounter (Signed)
Please advise 

## 2019-05-10 ENCOUNTER — Telehealth: Payer: Self-pay

## 2019-05-10 MED ORDER — ACETAMINOPHEN-CODEINE 300-30 MG PO TABS: ORAL_TABLET | ORAL | 4 refills | Status: DC

## 2019-05-10 NOTE — Telephone Encounter (Signed)
See previous phone note.  

## 2019-05-10 NOTE — Telephone Encounter (Signed)
[name]  I think the best thing to do is to gradually increase her gabapentin.  We moved it up to 100 mg 3 times a day.  When she has been at that dose for 2 or 3 days, she can start adding an additional tablet every 4 to 5 days until she is up to 2 tabs 3 times a day.  During that time, if she still having pain, I will call in some Tylenol 3.  I would use tramadol but she has an allergy to that.  If this is not working, please let me know.  So she will taper up on the gabapentin to 2 capsules 3 times a day, tapering over the next 1 to 2 weeks.  In the interim she will be taking Tylenol 3 and regular over-the-counter Tylenol as below. Kimberly Guerrero   Make sure she knows that she can still take over-the-counter Tylenol, acetaminophen with the Tylenol No. 3 but there is a 3000 mg maximum per day.  If she takes 3 of the Tylenol 3 that I am calling in, that is 1 every 8 hours, she can still pair that with over-the-counter acetaminophen taking a maximum of 4 of the 500 mg OTC Tylenol.  If she is taking the extra strength Tylenol which are 650, then she needs to adjust and take only 3 of those.  Either way the max for her is 3000 mg a day of acetaminophen

## 2019-05-10 NOTE — Telephone Encounter (Signed)
We made other changes as per other phone note Kimberly Guerrero

## 2019-05-27 ENCOUNTER — Other Ambulatory Visit: Payer: Self-pay

## 2019-05-27 ENCOUNTER — Other Ambulatory Visit: Payer: Self-pay | Admitting: *Deleted

## 2019-05-27 ENCOUNTER — Ambulatory Visit (INDEPENDENT_AMBULATORY_CARE_PROVIDER_SITE_OTHER): Payer: Medicare Other | Admitting: Family Medicine

## 2019-05-27 VITALS — BP 118/70 | Ht 69.0 in | Wt 140.0 lb

## 2019-05-27 DIAGNOSIS — M47816 Spondylosis without myelopathy or radiculopathy, lumbar region: Secondary | ICD-10-CM

## 2019-05-27 MED ORDER — GABAPENTIN 100 MG PO CAPS: ORAL_CAPSULE | ORAL | 1 refills | Status: DC

## 2019-05-27 MED ORDER — GABAPENTIN 100 MG PO CAPS: ORAL_CAPSULE | ORAL | 3 refills | Status: DC

## 2019-05-27 MED ORDER — ACETAMINOPHEN-CODEINE 300-30 MG PO TABS: ORAL_TABLET | ORAL | 4 refills | Status: DC

## 2019-05-27 NOTE — Progress Notes (Signed)
[facility]: Attending Note: I have reviewed the chart, discussed wit the Sports Medicine Fellow. I agree with assessment and treatment plan as detailed in the [place] note. Increase gabapentin and small increase in tylenol #3. Decrease additional acetaminophen. F/u 3-4 weeks. Likely can change to 300 mg tabs of gabapentin then. Hope to taper of tylenol #3 to a prn dose at some point. Will continue taper up gabapentin until maximal improvement or side effects. Her sp[inal stenosis is quite severe and she wishes to avoid any surgical intervention if at all possible.

## 2019-05-27 NOTE — Progress Notes (Signed)
    SUBJECTIVE:   CHIEF COMPLAINT:  Radicular back pain follow up  HPI:  Ms. Kimberly Guerrero is here for follow up of her lower back pain. She was last seen on 05/06/19 and her gabapentin was increased as she reported that this was improving her radicular pain down her posterior left leg. S[place] last visit, she reports that back pain has been worsening. She has been taking 200 mg gabapentin TID, 500 mg tylenol 6 times daily, as well as Tylenol 3 three times a day. She reports that this regimen is mostly managing her pain. She ran out of gabapentin today.  She reports that the radiating pain down her leg bothers her more than low back.She says that she has more pain pain when she wakes up in the morning.  Her pain is also exacerbated when she be bends forward frequently (which she does when doing house work).  She has had baclofen and epidural injections in the past which have been helpful.   PERTINENT  PMH / PSH: Osteoarthritis, chronic right SI joint pain, insomnia  OBJECTIVE:   There were no vitals taken for this visit.  PHYSICAL EXAM: Gen: NAD, alert, cooperative with exam, well-appearing HEENT: clear conjunctiva,  CV:  no edema, capillary refill brisk, normal rate Resp: non-labored Skin: no rashes, normal turgor  Neuro: no gross deficits.  Psych:  alert and oriented  Lumbar spine: - Inspection: no gross deformity or asymmetry, swelling or ecchymosis - Palpation: No TTP over the spinous processes, paraspinal muscles, or SI joints b/l - ROM: full active ROM of the lumbar spine in flexion and extension without pain - Strength: 5/5 strength of lower extremity in L4-S1 nerve root distributions b/l; normal gait - Neuro: sensation intact in the L4-S1 nerve root distribution b/l, 2+ L4 and S1 reflexes - Special testing: Negative straight leg raise, negative slump  ASSESSMENT/PLAN:  Low back pain with radiculopathy, with known facet arthritis and spinal stenosis. Will continue to increase  gabapentin as this seems to be helping with pain. Cautioned against taking too much tylenol as she is currently taking max tylenol in addition to tylenol 3- instructed her to take tylenol 3 OR tylenol but not to take both together. Will attempt to wean off tylenol 3 as we titrate up on gabapentin.   Medication Plan: Gabepentin 300 mg TID 500 mg tylenol TID- QID Tylenol 3 (300 mg-30 mg codeine) BID  Follow up in 4 weeks.  Jerolyn Shin, MD [place]

## 2019-05-27 NOTE — Patient Instructions (Signed)
It was a pleasure to see you today!  For back pain, here is the medication schedule to follow:  Gabapentin: 3 pills three times a day  Tylenol: 500 mg (1 tablet) 3-4 times a day (as needed) Tylenol 3: 2 times a day (as needed)  If you have any problems, please call us!

## 2019-06-03 ENCOUNTER — Other Ambulatory Visit: Payer: Self-pay

## 2019-06-03 ENCOUNTER — Ambulatory Visit: Payer: Medicare Other | Admitting: Family Medicine

## 2019-06-03 ENCOUNTER — Encounter: Payer: Self-pay | Admitting: Family Medicine

## 2019-06-03 ENCOUNTER — Ambulatory Visit (INDEPENDENT_AMBULATORY_CARE_PROVIDER_SITE_OTHER): Payer: Medicare Other | Admitting: Family Medicine

## 2019-06-03 VITALS — BP 108/60 | HR 55 | Ht 69.0 in | Wt 145.8 lb

## 2019-06-03 DIAGNOSIS — H1131 Conjunctival hemorrhage, right eye: Secondary | ICD-10-CM

## 2019-06-03 NOTE — Assessment & Plan Note (Signed)
Assessment: Subconjunctival hemorrhage of the right eye.  This is supported by lack of photophobia, pain, drainage.  No abnormality to the iris on exam.  No infectious symptoms.  Patient endorses no symptoms with this right eye other than the patch of redness in the medial aspect Plan: -Discussed with patient reassurance that her symptoms are consistent with subconjunctival hemorrhage and this will resolve on its own -Discussed return precautions including drainage, particularly pus, pain, photophobia, change in vision. -Patient plans to follow-up in 2 weeks if redness has not improved

## 2019-06-03 NOTE — Patient Instructions (Signed)
It was great to see you!  Our plans for today:  -Today we discussed her right eye symptoms.  I believe this is most likely a subconjunctival hemorrhage.  Unfortunately there is nothing we can really do to help speed up the recovery of this.  This can often be caused by trauma, even in your sleep, sneezing, high blood pressure and will improve over the next 7 to 12 days typically. -I would like for you to let us know if you start having pain, change in your vision, drainage such as pus, or if the eye symptoms do not improve over the next 2 weeks.  Take care and seek immediate care sooner if you develop any concerns.   Dr[facility]   Subconjunctival Hemorrhage Subconjunctival hemorrhage is bleeding that happens between the white part of your eye (sclera) and the clear membrane that covers the outside of your eye (conjunctiva). There are many tiny blood vessels near the surface of your eye. A subconjunctival hemorrhage happens when one or more of these vessels breaks and bleeds, causing a red patch to appear on your eye. This is similar to a bruise. Depending on the amount of bleeding, the red patch may only cover a small area of your eye or it may cover the entire visible part of the sclera. If a lot of blood collects under the conjunctiva, there may also be swelling. Subconjunctival hemorrhages do not affect your vision or cause pain, but your eye may feel irritated if there is swelling. Subconjunctival hemorrhages usually do not require treatment, and they usually disappear on their own within two weeks. What are the causes? This condition may be caused by:  Mild trauma, such as rubbing your eye too hard.  Blunt injuries, such as from playing sports or having contact with a deployed airbag.  Coughing, sneezing, or vomiting.  Straining, such as when lifting a heavy object.  High blood pressure.  Recent eye surgery.  Diabetes.  Certain medicines, especially blood  thinners (anticoagulants).  Other conditions, such as eye tumors, bleeding disorders, or blood vessel abnormalities. Subconjunctival hemorrhages can also happen without an obvious cause. What are the signs or symptoms? Symptoms of this condition include:  A bright red or dark red patch on the white part of the eye. The red area may: ? Spread out to cover a larger area of the eye before it goes away. ? Turn brownish-yellow before it goes away.  Swelling around the eye.  Mild eye irritation. How is this diagnosed? This condition is diagnosed with a physical exam. If your subconjunctival hemorrhage was caused by trauma, your health care provider may refer you to an eye specialist (ophthalmologist) or another specialist to check for other injuries. You may have other tests, including:  An eye exam.  A blood pressure check.  Blood tests to check for bleeding disorders. If your subconjunctival hemorrhage was caused by trauma, X-rays or a CT scan may be done to check for other injuries. How is this treated? Usually, treatment is not needed for this condition. If you have discomfort, your health care provider may recommend eye drops or cold compresses. Follow these instructions at home:  Take over-the-counter and prescription medicines only as directed by your health care provider.  Use eye drops or cold compresses to help with discomfort as directed by your health care provider.  Avoid activities, things, and environments that may irritate or injure your eye.  Keep all follow-up visits as told by your health  care provider. This is important. Contact a health care provider if:  You have pain in your eye.  The bleeding does not go away within 3 weeks.  You keep getting new subconjunctival hemorrhages. Get help right away if:  Your vision changes or you have difficulty seeing.  You suddenly develop severe sensitivity to light.  You develop a severe headache, persistent vomiting,  confusion, or abnormal tiredness (lethargy).  Your eye seems to bulge or protrude from your eye socket.  You develop unexplained bruises on your body.  You have unexplained bleeding in another area of your body. Summary  Subconjunctival hemorrhage is bleeding that happens between the white part of your eye and the clear membrane that covers the outside of your eye.  This condition is similar to a bruise.  Subconjunctival hemorrhages usually do not require treatment, and they usually disappear on their own within two weeks.  Use eye drops or cold compresses to help with discomfort as directed by your health care provider. This information is not intended to replace advice given to you by your health care provider. Make sure you discuss any questions you have with your health care provider. Document Revised: 06/30/2018 Document Reviewed: 10/14/2017 Elsevier Patient Education  [place].

## 2019-06-03 NOTE — Progress Notes (Signed)
    SUBJECTIVE:   CHIEF COMPLAINT / HPI:   "Eye redness" Patient is a very pleasant 69 year old female who presents today to discuss what she believes to be "pinkeye". Eye issue started 2 days ago, no pain, no change in vision. No recent coughing or sneezing. Noticed it when she woke up a few days ago. No discharge.  Patient does not wear contacts and endorses no symptoms whatsoever of her right eye other than the patch of redness which appeared upon awakening 2 days ago.  PERTINENT  PMH / PSH: None pertinent  OBJECTIVE:   BP 108/60   Pulse (!) 55   Ht 5\' 9"  (1.753 m)   Wt 145 lb 12.8 oz (66.1 kg)   SpO2 100%   BMI 21.53 kg/m   Pulse 55 on recheck  General: NAD, pleasant, able to participate in exam HEENT: Right eye with patch of redness in the medial aspect consistent with subconjunctival hemorrhage, no photophobia on exam, no obvious trauma on eye exam, no loss of extraocular movement.  No drainage noted on exam.  Left eye normal  ASSESSMENT/PLAN:   Subconjunctival hemorrhage of right eye Assessment: Subconjunctival hemorrhage of the right eye.  This is supported by lack of photophobia, pain, drainage.  No abnormality to the iris on exam.  No infectious symptoms.  Patient endorses no symptoms with this right eye other than the patch of redness in the medial aspect Plan: -Discussed with patient reassurance that her symptoms are consistent with subconjunctival hemorrhage and this will resolve on its own -Discussed return precautions including drainage, particularly pus, pain, photophobia, change in vision. -Patient plans to follow-up in 2 weeks if redness has not improved     Lurline Del, [place]

## 2019-06-17 ENCOUNTER — Ambulatory Visit: Payer: Medicare Other | Admitting: Family Medicine

## 2019-07-01 ENCOUNTER — Other Ambulatory Visit: Payer: Self-pay

## 2019-07-01 ENCOUNTER — Ambulatory Visit (INDEPENDENT_AMBULATORY_CARE_PROVIDER_SITE_OTHER): Payer: Medicare Other | Admitting: Family Medicine

## 2019-07-01 DIAGNOSIS — M5442 Lumbago with sciatica, left side: Secondary | ICD-10-CM | POA: Diagnosis not present

## 2019-07-01 DIAGNOSIS — G8929 Other chronic pain: Secondary | ICD-10-CM | POA: Diagnosis not present

## 2019-07-01 DIAGNOSIS — M65312 Trigger thumb, left thumb: Secondary | ICD-10-CM | POA: Diagnosis not present

## 2019-07-01 DIAGNOSIS — M6530 Trigger finger, unspecified finger: Secondary | ICD-10-CM | POA: Insufficient documentation

## 2019-07-01 NOTE — Assessment & Plan Note (Signed)
Patient with low back pain consistent with her spinal stenosis.  Pain is improved with bending forward and worse with bending backwards.  Patient seems to be doing well on her current regimen so we will continue this.  Advised to continue Tylenol 3 as well as gabapentin.  Advised that she can use either Aleve twice daily or 4 Advil today but should not use both.  Patient should follow-up in 6 to 8 weeks.

## 2019-07-01 NOTE — Patient Instructions (Signed)
1. For your back pain, you can take EITHER aleve twice a day OR advil 4 times a day. DO NOT DO BOTH 2. Continue the gabapentin as you have been taking it  3. Follow up in 6-8 weeks with Dr. Nori Riis

## 2019-07-01 NOTE — Progress Notes (Signed)
° °  Kimberly Guerrero is a 69 y.o. female who presents to [facility] today for the following:  Back pain Patient presenting for follow-up of her low back pain.  States that it feels much better but is not perfect.  Has been taking gabapentin 300 mg 3 times daily, Tylenol 3 2 pills in the morning and 2 pills in the evening.  Is not using regular Tylenol.  Is also using 2 Aleve or Advil daily.  States that she can do a lot more activities now.  Does report she continues to feel pain in her low back which radiates down her left leg.  She can still walk.  Denies any incontinence.  Denies any fever.  Trigger finger Patient currently has trigger finger in her left thumb.  Is followed by Dr. Alfonso Ramus at [facility].  Patient states that Dr. Alfonso Ramus has provided her with a few shots but stated that if she continued to have pain that surgery with her next step.  She is worried about surgery and would like to discuss further with Dr. Nori Riis.  Reports that in the morning her thumb seems to pop and there is pain when it pops back in.  Does not bother her the rest of the day.  PMH reviewed. osteoarthritis ROS as above. Medications reviewed.  Exam:  BP 114/72    Ht 5\' 9"  (1.753 m)    Wt 140 lb (63.5 kg)    BMI 20.67 kg/m  Gen: Well NAD MSK: Neck/Back: - Inspection: no gross deformity or asymmetry, swelling or ecchymosis - Palpation: no TTP of spinous process - ROM: full active ROM of the thoracic and lumbar spine with neck extension, rotation, flexion. Pain with extension  Hand: Inspection: No obvious deformity. No swelling, erythema or bruising Palpation: no TTP ROM: Full ROM of the digits and wrist. Fully able to extend and flex finger. Strength: 5/5 strength in the forearm, wrist and interosseus muscles Neurovascular: NV intact Fingers:  No swelling in PIP, DIP joints.   Assessment and Plan: 1) Back pain Patient with low back pain consistent with her spinal stenosis.  Pain is improved with bending forward  and worse with bending backwards.  Patient seems to be doing well on her current regimen so we will continue this.  Advised to continue Tylenol 3 as well as gabapentin.  Advised that she can use either Aleve twice daily or 4 Advil today but should not use both.  Patient should follow-up in 6 to 8 weeks.    Trigger finger Normal exam.  Discussed that if patient is hesitant with surgery and is not having pain throughout the day she can avoid surgery at this time.  Advised that if she would like to continue injection she can do so.  Patient states that she will follow-up if she desires further injections but would not like any at this time.  Is only present in the morning so would like to wait on any further invasive measures.   Dalphine Handing, PGY-3 [facility] Resident  07/01/2019 11:54 AM

## 2019-07-01 NOTE — Assessment & Plan Note (Signed)
Normal exam.  Discussed that if patient is hesitant with surgery and is not having pain throughout the day she can avoid surgery at this time.  Advised that if she would like to continue injection she can do so.  Patient states that she will follow-up if she desires further injections but would not like any at this time.  Is only present in the morning so would like to wait on any further invasive measures.

## 2019-07-01 NOTE — Progress Notes (Signed)
[place] Attending Note: I have seen and examined this patient. I have discussed this patient with the resident and reviewed the assessment and plan as documented above. I agree with the resident's findings and plan. Improved back and leg pain on left that is from her severe spinal stenosis. We discussed her meds a tlength, making sure she understands what to take. Will continue current regimen with addition of prn low dose NSAIDS . I will have her do frequent follow up as she may need medication titration as well as she is very concerned about worsening symptoms. Regarding her trigger thumb on left, we discussed options and she is having only intermittent, but daily symptoms. At this time she does not want to consider any more injections or surgery so she elects to follow and I agree.

## 2019-07-08 ENCOUNTER — Ambulatory Visit: Payer: Medicare Other | Admitting: Family Medicine

## 2019-07-29 ENCOUNTER — Other Ambulatory Visit: Payer: Self-pay

## 2019-07-31 MED ORDER — CETIRIZINE HCL 10 MG PO TABS: 10.0000 mg | ORAL_TABLET | Freq: Every day | ORAL | 11 refills | Status: AC

## 2019-08-12 ENCOUNTER — Ambulatory Visit (INDEPENDENT_AMBULATORY_CARE_PROVIDER_SITE_OTHER): Payer: Medicare Other | Admitting: Family Medicine

## 2019-08-12 ENCOUNTER — Other Ambulatory Visit: Payer: Self-pay

## 2019-08-12 VITALS — BP 130/90 | Ht 69.0 in | Wt 145.0 lb

## 2019-08-12 DIAGNOSIS — M48062 Spinal stenosis, lumbar region with neurogenic claudication: Secondary | ICD-10-CM

## 2019-08-12 DIAGNOSIS — K5903 Drug induced constipation: Secondary | ICD-10-CM | POA: Diagnosis not present

## 2019-08-12 DIAGNOSIS — M5416 Radiculopathy, lumbar region: Secondary | ICD-10-CM | POA: Diagnosis not present

## 2019-08-12 MED ORDER — GABAPENTIN 100 MG PO CAPS: ORAL_CAPSULE | ORAL | 0 refills | Status: DC

## 2019-08-12 NOTE — Progress Notes (Signed)
  Kimberly Guerrero - 69 y.o. 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  We have been tapering up her gabapentin.  She still having chronic pain particularly in the left posterior thigh.  Feels like a toothache.  Pain can be 6-8 out of 10 despite her medication regimen.  We had added occasional Tylenol 3 for breakthrough pain but that seems to be causing some constipation.    OBJECTIVE: BP 130/90   Ht 5\' 9"  (1.753 m)   Wt 145 lb (65.8 kg)   BMI 21.41 kg/m   Physical Exam:  Vital signs are reviewed. GENERAL: Well-developed female no acute distress GAIT: Slightly antalgic Lower extremity strength 5 out of 5 in hip flexion.  Positive straight leg raise seated position   ASSESSMENT & PLAN:  See problem based charting & AVS for pt instructions. No problem-specific Assessment & Plan notes found for this encounter.

## 2019-08-12 NOTE — Patient Instructions (Signed)
Increase your gabapentin to  4 tabs AM 3 at mid-day  4 at bedtime.  I think the tylenol #3 is contributing to your constipation so use as little as you have to  ADD some miralax or mettamucil or similar on a DAILY basis. See me in 3-4 weeks

## 2019-08-12 NOTE — Assessment & Plan Note (Signed)
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  We are gently taping up in her gabapentin so we will increase that today.

## 2019-08-12 NOTE — Assessment & Plan Note (Signed)
Unfortunately the Tylenol 3 seems to have given her some side effects constipation.  We discussed using daily fiber.  She has been using as needed enema and that is fine.  She will back off the Tylenol 3 as much as she can.  I will follow up in 3 to 4 weeks.  We have increased her gabapentin and I plan to taper that up further and the future so hopefully we can get her totally off this medication.

## 2019-08-29 ENCOUNTER — Telehealth: Payer: Self-pay | Admitting: *Deleted

## 2019-08-30 MED ORDER — ACETAMINOPHEN-CODEINE 300-30 MG PO TABS: ORAL_TABLET | ORAL | 4 refills | Status: DC

## 2019-08-30 NOTE — Telephone Encounter (Signed)
Refilled Kimberly Guerrero

## 2019-09-27 ENCOUNTER — Telehealth: Payer: Self-pay

## 2019-09-28 MED ORDER — GABAPENTIN 100 MG PO CAPS: ORAL_CAPSULE | ORAL | 2 refills | Status: DC

## 2019-09-28 NOTE — Telephone Encounter (Signed)
refilled 

## 2019-10-24 ENCOUNTER — Other Ambulatory Visit: Payer: Self-pay | Admitting: Family Medicine

## 2019-11-18 ENCOUNTER — Other Ambulatory Visit: Payer: Self-pay | Admitting: Family Medicine

## 2019-11-18 ENCOUNTER — Other Ambulatory Visit: Payer: Self-pay | Admitting: Neonatology

## 2019-11-18 ENCOUNTER — Other Ambulatory Visit: Payer: Self-pay | Admitting: *Deleted

## 2019-11-18 DIAGNOSIS — Z1231 Encounter for screening mammogram for malignant neoplasm of breast: Secondary | ICD-10-CM

## 2019-12-13 ENCOUNTER — Telehealth: Payer: Self-pay

## 2019-12-13 MED ORDER — GABAPENTIN 100 MG PO CAPS: ORAL_CAPSULE | ORAL | 2 refills | Status: DC

## 2019-12-13 NOTE — Telephone Encounter (Signed)
-----   Message from Carolyne Littles sent at 12/13/2019  8:51 AM EST ----- Regarding: refill request Pt is asking for a refill on gabapentin. Same pharmacy per patient.

## 2019-12-14 ENCOUNTER — Ambulatory Visit: Payer: Medicare Other

## 2019-12-27 ENCOUNTER — Other Ambulatory Visit: Payer: Self-pay

## 2019-12-27 ENCOUNTER — Ambulatory Visit
Admission: RE | Admit: 2019-12-27 | Discharge: 2019-12-27 | Disposition: A | Discharge status: Home / Self Care | Payer: Medicare Other | Source: Ambulatory Visit | Attending: Family Medicine | Admitting: Family Medicine

## 2019-12-27 DIAGNOSIS — Z1231 Encounter for screening mammogram for malignant neoplasm of breast: Secondary | ICD-10-CM

## 2020-01-02 ENCOUNTER — Other Ambulatory Visit: Payer: Self-pay | Admitting: *Deleted

## 2020-01-03 MED ORDER — GABAPENTIN 100 MG PO CAPS: ORAL_CAPSULE | ORAL | 2 refills | Status: DC

## 2020-01-05 ENCOUNTER — Other Ambulatory Visit: Payer: Self-pay

## 2020-01-05 ENCOUNTER — Ambulatory Visit (INDEPENDENT_AMBULATORY_CARE_PROVIDER_SITE_OTHER): Payer: Medicare Other

## 2020-01-05 DIAGNOSIS — Z23 Encounter for immunization: Secondary | ICD-10-CM

## 2020-01-05 NOTE — Progress Notes (Signed)
   Covid-19 Vaccination Clinic  Name:  Kimberly Guerrero    MRN: [id number] DOB: May 05, 1950  01/05/2020   Patient presents to nurse clinic for COVID booster and flu vaccination. Patient answers no to all screening questions. Flu shot administered in RD, site unremarkable, tolerated injection well.   COVID vaccine administered in LD, after evaluation by Dr. Nori Riis for left shoulder mass. Per Dr. Nori Riis, vaccine could still be administered in LD. Patient tolerated injection well.   Kimberly Guerrero was observed post Covid-19 immunization for 15 minutes without incident. She was provided with Vaccine Information Sheet and instruction to access the V-Safe system.   Kimberly Guerrero was instructed to call 911 with any severe reactions post vaccine: Marland Kitchen Difficulty breathing  . Swelling of face and throat  . A fast heartbeat  . A bad rash all over body  . Dizziness and weakness  Provided patient with updated immunization record and card.   Talbot Grumbling, RN

## 2020-01-07 DIAGNOSIS — R001 Bradycardia, unspecified: Secondary | ICD-10-CM | POA: Diagnosis not present

## 2020-01-07 DIAGNOSIS — I10 Essential (primary) hypertension: Secondary | ICD-10-CM | POA: Diagnosis not present

## 2020-01-09 ENCOUNTER — Encounter: Payer: Self-pay | Admitting: Family Medicine

## 2020-01-09 ENCOUNTER — Other Ambulatory Visit: Payer: Self-pay

## 2020-01-09 ENCOUNTER — Ambulatory Visit (INDEPENDENT_AMBULATORY_CARE_PROVIDER_SITE_OTHER): Payer: Medicare Other | Admitting: Family Medicine

## 2020-01-09 DIAGNOSIS — R7303 Prediabetes: Secondary | ICD-10-CM | POA: Diagnosis not present

## 2020-01-09 DIAGNOSIS — R011 Cardiac murmur, unspecified: Secondary | ICD-10-CM | POA: Diagnosis not present

## 2020-01-09 DIAGNOSIS — R001 Bradycardia, unspecified: Secondary | ICD-10-CM | POA: Insufficient documentation

## 2020-01-09 DIAGNOSIS — I10 Essential (primary) hypertension: Secondary | ICD-10-CM

## 2020-01-09 HISTORY — DX: Essential (primary) hypertension: I10

## 2020-01-09 LAB — POCT GLYCOSYLATED HEMOGLOBIN (HGB A1C): Hemoglobin A1C: 5.8 % — AB (ref 4.0–5.6)

## 2020-01-09 MED ORDER — AMLODIPINE BESYLATE 5 MG PO TABS: 5.0000 mg | ORAL_TABLET | Freq: Every day | ORAL | 3 refills | Status: DC

## 2020-01-09 NOTE — Patient Instructions (Signed)
I will call tomorrow with the blood test results. I sent in a prescription for a high blood pressure medication. Someone should call you to set up a cardiology referral. Someone should call to set up a time for an echocardiogram I hope when all the tests are in that your heart checks out fine.  I want to be a good, thorough doctor and give you a good checkup.

## 2020-01-10 NOTE — Assessment & Plan Note (Signed)
A1C remains good in the prediabetic range.  Her BMI is in the ideal range.  No intervention.

## 2020-01-10 NOTE — Assessment & Plan Note (Signed)
Check tSH.  Cards referral.  I doubt she will need pacer given that she is asymptomatic.  Still, an evaluation is warranted.

## 2020-01-10 NOTE — Progress Notes (Signed)
    SUBJECTIVE:   CHIEF COMPLAINT / HPI:   [facility] visit.  She saw them because her home blood pressure readings were high and her heart rate was low.  Urgent care confirmed this. EKG done there marked bradycardia with a HR=49.  She was also told she had a heart murmur, which she is uncertain if she has had before.  Patient denies syncope, lightheadedness or dyspnea on exertion.  As best I can tell, she is asymptomatic from her bradycardia.  Although she did not remember, she had been seen by cards back in 2011.  Patient had a 48-hour Holter done and showed sinus bradycardia down to HR=40.    I reviewed her home BP readings and more than have are elevated.  Not previously diagnosed with hypertension and not on any meds which would affect the HR.  She has also been prediabetic on previous Hgb A1C.    OBJECTIVE:   BP 138/82   Pulse (!) 52   Ht 5\' 9"  (1.753 m)   Wt 144 lb 12.8 oz (65.7 kg)   SpO2 99%   BMI 21.38 kg/m   Low HR confirmed.   Neck supple without thyromegally Lungs clear Cardiac RRR with 1/6 SEM Ext no edema  ASSESSMENT/PLAN:   Prediabetes A1C remains good in the prediabetic range.  Her BMI is in the ideal range.  No intervention.  Heart murmur previously undiagnosed The heart m is soft and I could have easily missed it if I were not clued into listening for it.  Still with her new m and bradycardia, I believe she needs echo and cards referral.  Essential hypertension Check labs.  Given more than half of BPs elevated, will start on low dose amlodipine.  Bradycardia Check tSH.  Cards referral.  I doubt she will need pacer given that she is asymptomatic.  Still, an evaluation is warranted.     Zenia Resides, MD [place]

## 2020-01-10 NOTE — Assessment & Plan Note (Signed)
Check labs.  Given more than half of BPs elevated, will start on low dose amlodipine.

## 2020-01-10 NOTE — Assessment & Plan Note (Signed)
The heart m is soft and I could have easily missed it if I were not clued into listening for it.  Still with her new m and bradycardia, I believe she needs echo and cards referral.

## 2020-01-11 LAB — CBC
Hematocrit: 37.9 % (ref 34.0–46.6)
Hemoglobin: 12.4 g/dL (ref 11.1–15.9)
MCH: 27.5 pg (ref 26.6–33.0)
MCHC: 32.7 g/dL (ref 31.5–35.7)
MCV: 84 fL (ref 79–97)
Platelets: 143 10*3/uL — ABNORMAL LOW (ref 150–450)
RBC: 4.51 x10E6/uL (ref 3.77–5.28)
RDW: 12.5 % (ref 11.7–15.4)
WBC: 5.3 10*3/uL (ref 3.4–10.8)

## 2020-01-11 LAB — CMP14+EGFR
ALT: 24 IU/L (ref 0–32)
AST: 26 IU/L (ref 0–40)
Albumin/Globulin Ratio: 1.9 (ref 1.2–2.2)
Albumin: 4.4 g/dL (ref 3.8–4.8)
Alkaline Phosphatase: 106 IU/L (ref 44–121)
BUN/Creatinine Ratio: 30 — ABNORMAL HIGH (ref 12–28)
BUN: 24 mg/dL (ref 8–27)
Bilirubin Total: <0.2 mg/dL (ref 0.0–1.2)
CO2: 24 mmol/L (ref 20–29)
Calcium: 10 mg/dL (ref 8.7–10.3)
Chloride: 105 mmol/L (ref 96–106)
Creatinine, Ser: 0.8 mg/dL (ref 0.57–1.00)
GFR calc Af Amer: 87 mL/min/{1.73_m2} (ref >59)
GFR calc non Af Amer: 75 mL/min/{1.73_m2} (ref >59)
Globulin, Total: 2.3 g/dL (ref 1.5–4.5)
Glucose: 95 mg/dL (ref 65–99)
Potassium: 4.1 mmol/L (ref 3.5–5.2)
Sodium: 143 mmol/L (ref 134–144)
Total Protein: 6.7 g/dL (ref 6.0–8.5)

## 2020-01-11 LAB — TSH: TSH: 0.973 u[IU]/mL (ref 0.450–4.500)

## 2020-01-16 ENCOUNTER — Telehealth: Payer: Self-pay

## 2020-01-17 MED ORDER — ACETAMINOPHEN-CODEINE 300-30 MG PO TABS: ORAL_TABLET | ORAL | 0 refills | Status: DC

## 2020-01-17 NOTE — Telephone Encounter (Signed)
[name] OK, let her know I sent it in [facility]! Dorcas Mcmurray

## 2020-01-30 ENCOUNTER — Other Ambulatory Visit: Payer: Self-pay

## 2020-01-30 ENCOUNTER — Ambulatory Visit (HOSPITAL_COMMUNITY): Payer: Medicare Other | Attending: Cardiology

## 2020-01-30 DIAGNOSIS — R011 Cardiac murmur, unspecified: Secondary | ICD-10-CM

## 2020-01-30 LAB — ECHOCARDIOGRAM COMPLETE
Area-P 1/2: 3.27 cm2
S' Lateral: 2.4 cm

## 2020-01-31 ENCOUNTER — Other Ambulatory Visit (HOSPITAL_COMMUNITY): Payer: Medicare Other

## 2020-02-07 ENCOUNTER — Ambulatory Visit (INDEPENDENT_AMBULATORY_CARE_PROVIDER_SITE_OTHER): Payer: Medicare Other

## 2020-02-07 ENCOUNTER — Ambulatory Visit (INDEPENDENT_AMBULATORY_CARE_PROVIDER_SITE_OTHER): Payer: Medicare Other | Admitting: Cardiology

## 2020-02-07 ENCOUNTER — Encounter: Payer: Self-pay | Admitting: *Deleted

## 2020-02-07 ENCOUNTER — Other Ambulatory Visit: Payer: Self-pay

## 2020-02-07 ENCOUNTER — Encounter: Payer: Self-pay | Admitting: Cardiology

## 2020-02-07 VITALS — BP 120/70 | HR 54 | Ht 69.0 in | Wt 142.0 lb

## 2020-02-07 DIAGNOSIS — I340 Nonrheumatic mitral (valve) insufficiency: Secondary | ICD-10-CM | POA: Diagnosis not present

## 2020-02-07 DIAGNOSIS — R001 Bradycardia, unspecified: Secondary | ICD-10-CM

## 2020-02-07 NOTE — Patient Instructions (Signed)
Medication Instructions:  The current medical regimen is effective;  continue present plan and medications.  *If you need a refill on your cardiac medications before your next appointment, please call your pharmacy*  Testing/Procedures: ZIO XT- Long Term Monitor Instructions   Your physician has requested you wear your ZIO patch monitor 14 days.   This is a single patch monitor.  [employer] supplies one patch monitor per enrollment.  Additional stickers are not available.   Please do not apply patch if you will be having a Nuclear Stress Test, Echocardiogram, Cardiac CT, MRI, or Chest Xray during the time frame you would be wearing the monitor. The patch cannot be worn during these tests.  You cannot remove and re-apply the ZIO XT patch monitor.   Your ZIO patch monitor will be sent USPS Priority mail from [employer] directly to your home address. The monitor may also be mailed to a PO BOX if home delivery is not available.   It may take 3-5 days to receive your monitor after you have been enrolled.   Once you have received you monitor, please review enclosed instructions.  Your monitor has already been registered assigning a specific monitor serial # to you.   Applying the monitor   Shave hair from upper left chest.   Hold abrader disc by orange tab.  Rub abrader in 40 strokes over left upper chest as indicated in your monitor instructions.   Clean area with 4 enclosed alcohol pads .  Use all pads to assure are is cleaned thoroughly.  Let dry.   Apply patch as indicated in monitor instructions.  Patch will be place under collarbone on left side of chest with arrow pointing upward.   Rub patch adhesive wings for 2 minutes.Remove white label marked "1".  Remove white label marked "2".  Rub patch adhesive wings for 2 additional minutes.   While looking in a mirror, press and release button in center of patch.  A small green light will flash 3-4 times .  This will be your only  indicator the monitor has been turned on.     Do not shower for the first 24 hours.  You may shower after the first 24 hours.   Press button if you feel a symptom. You will hear a small click.  Record Date, Time and Symptom in the Patient Log Book.   When you are ready to remove patch, follow instructions on last 2 pages of Patient Log Book.  Stick patch monitor onto last page of Patient Log Book.   Place Patient Log Book in Blue box.  Use locking tab on box and tape box closed securely.  The Orange and White box has prepaid postage on it.  Please place in mailbox as soon as possible.  Your physician should have your test results approximately 7 days after the monitor has been mailed back to [employer].   Call [employer] Customer Care at [phone] if you have questions regarding your ZIO XT patch monitor.  Call them immediately if you see an orange light blinking on your monitor.   If your monitor falls off in less than 4 days contact our Monitor department at [phone].  If your monitor becomes loose or falls off after 4 days call [employer] at [phone] for suggestions on securing your monitor.   Follow-Up: At [facility], you and your health needs are our priority.  As part of our continuing mission to provide you with exceptional heart care, we have   created designated Provider Care Teams.  These Care Teams include your primary Cardiologist (physician) and Advanced Practice Providers (APPs -  Physician Assistants and Nurse Practitioners) who all work together to provide you with the care you need, when you need it.  We recommend signing up for the patient portal called "MyChart".  Sign up information is provided on this After Visit Summary.  MyChart is used to connect with patients for Virtual Visits (Telemedicine).  Patients are able to view lab/test results, encounter notes, upcoming appointments, etc.  Non-urgent messages can be sent to your provider as well.   To learn  more about what you can do with MyChart, go to [email or web address].    Your next appointment:   Follow up will be determined after the above testing.  Thank you for choosing [facility]!!

## 2020-02-07 NOTE — Progress Notes (Signed)
Patient ID: Kimberly Guerrero, female   DOB: [birth year], 70 y.o.   MRN: [id number] Patient enrolled for [employer] to ship a 14 day ZIO XT long term holter monitor to [address] [place], [place]  [id number].

## 2020-02-07 NOTE — Progress Notes (Signed)
Cardiology Office Note:    Date:  02/07/2020   ID:  [name], DOB Jan 03, 1951, MRN [id number]  PCP:  Danna Hefty, DO  [facility] Cardiologist:  Candee Furbish, MD  [facility] Electrophysiologist:  None   Referring MD: Zenia Resides, MD     History of Present Illness:    Kimberly Guerrero is a 70 y.o. female here for the evaluation of bradycardia at the request of Dr. Tarry Kos.  Previously was seen in 2011 by Dr. Thompson Grayer of electrophysiology.  Prior to that, Dr. Haroldine Laws in 2007.  She was feeling fatigued, giving out, weak.  Prior Lyme titers were negative.  When ambulating in the office her heart rate increased to 72.  Heart rate was 53 at baseline.  Recent echo 01/30/2020 was normal.  In review of recent note with Dr. Andria Frames on 01/09/2020- EKG done at a urgent care facility was showed marked bradycardia with heart rate of 49 bpm.  She was also told that she had a murmur.  Her echocardiogram as below was normal.  She was asymptomatic from her bradycardia.  Previous Holter monitor showed her heart rates down into the 40s with sleep.  TSH 0.97  LDL 34 creatinine 0.8, potassium 4.1  Past Medical History:  Diagnosis Date  . Back pain    Chronic back pain   . Bradycardia   . Chronic fatigue   . Heart murmur   . Hypertension   . Osteoarthritis   . Prediabetes     No past surgical history on file.  Current Medications: Current Meds  Medication Sig  . Acetaminophen-Codeine 300-30 MG tablet Take one tablet by mouth every 6 hours as needed for back pain and adjust any additional acetaminophen OTC to max of 2 additional 500 mg tabs OTC  a day  . amLODipine (NORVASC) 5 MG tablet Take 1 tablet (5 mg total) by mouth at bedtime.  Marland Kitchen atorvastatin (LIPITOR) 40 MG tablet TAKE 1 TABLET(40 MG) BY MOUTH DAILY  . cetirizine (ZYRTEC) 10 MG tablet Take 1 tablet (10 mg total) by mouth daily.  . fluticasone (FLONASE) 50 MCG/ACT nasal spray SHAKE LIQUID AND USE 2 SPRAYS IN  EACH NOSTRIL DAILY AS NEEDED FOR ALLERGIES OR RHINITIS  . gabapentin (NEURONTIN) 100 MG capsule Take by mouth 4 in AM 4 at lunch and 4 at bedtime  . ibuprofen (ADVIL) 800 MG tablet Take 1 tablet (800 mg total) by mouth every 12 (twelve) hours as needed.     Allergies:   Tramadol   Social History   Socioeconomic History  . Marital status: Single    Spouse name: Not on file  . Number of children: Not on file  . Years of education: 74  . Highest education level: High school graduate  Occupational History  . 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 Smoking status: Never Smoker  . Smokeless tobacco: Never Used  Vaping Use  . Vaping Use: Never used  Substance and Sexual Activity  . Alcohol use: No    Alcohol/week: 0.0 standard drinks  . Drug use: No  . Sexual activity: Not Currently  Other Topics Concern  . Not on file  Social History Narrative   Patient lives alone here in [place].    Patient still drives herself to run errands and medical apts.    Patient is close with her daughter Kimberly Guerrero.    Patient practices Yoga daily to help with her  chronic pain.    Patient enjoys reading and cooking.    Social Determinants of Health   Financial Resource Strain: Not on file  Food Insecurity: Not on file  Transportation Needs: Not on file  Physical Activity: Not on file  Stress: Not on file  Social Connections: Not on file     Family History: The patient's family history is negative for Breast cancer.  ROS:   Please see the history of present illness.    No syncope no bleeding no orthopnea no PND all other systems reviewed and are negative.  EKGs/Labs/Other Studies Reviewed:    The following studies were reviewed today:  ECHO 01/30/20:  1. Left ventricular ejection fraction, by estimation, is 60 to 65%. The  left ventricle has normal function. The left ventricle has no regional  wall motion abnormalities. Left ventricular diastolic parameters  are  consistent with Grade I diastolic  dysfunction (impaired relaxation).  2. Right ventricular systolic function is normal. The right ventricular  size is normal. There is normal pulmonary artery systolic pressure. The  estimated right ventricular systolic pressure is [id number] mmHg.  3. The mitral valve is normal in structure. Mild mitral valve  regurgitation. No evidence of mitral stenosis.  4. The aortic valve is normal in structure. Aortic valve regurgitation is  not visualized. No aortic stenosis is present.  5. The inferior vena cava is normal in size with greater than 50%  respiratory variability, suggesting right atrial pressure of 3 mmHg.   EKG:  EKG is  ordered today.  The ekg ordered today demonstrates sinus bradycardia 54 normal PR interval 178, QRS duration 86, QTc 407  Recent Labs: 01/09/2020: ALT 24; BUN 24; Creatinine, Ser 0.80; Hemoglobin 12.4; Platelets 143; Potassium 4.1; Sodium 143; TSH 0.973  Recent Lipid Panel    Component Value Date/Time   CHOL 130 04/01/2019 1622   TRIG 42 04/01/2019 1622   HDL 85 04/01/2019 1622   CHOLHDL 1.5 04/01/2019 1622   CHOLHDL 2.7 02/27/2015 0957   VLDL 29 02/27/2015 0957   LDLCALC 34 04/01/2019 1622     Risk Assessment/Calculations:       Physical Exam:    VS:  BP 120/70 (BP Location: Left Arm, Patient Position: Sitting, Cuff Size: Normal)   Pulse (!) 54   Ht 5\' 9"  (1.753 m)   Wt 142 lb (64.4 kg)   SpO2 98%   BMI 20.97 kg/m     Wt Readings from Last 3 Encounters:  02/07/20 142 lb (64.4 kg)  01/09/20 144 lb 12.8 oz (65.7 kg)  08/12/19 145 lb (65.8 kg)     GEN:  Well nourished, well developed in no acute distress HEENT: Normal NECK: No JVD; No carotid bruits LYMPHATICS: No lymphadenopathy CARDIAC: Bradycardic regular, 1/6 systolic murmur left lower sternal border consistent with mitral regurgitation, no rubs, gallops RESPIRATORY:  Clear to auscultation without rales, wheezing or rhonchi  ABDOMEN: Soft,  non-tender, non-distended MUSCULOSKELETAL:  No edema; No deformity  SKIN: Warm and dry NEUROLOGIC:  Alert and oriented x 3 PSYCHIATRIC:  Normal affect   ASSESSMENT:    1. Bradycardia   2. Nonrheumatic mitral valve regurgitation    PLAN:    In order of problems listed above:  Sinus bradycardia - Agree with Dr. Andria Frames that this is asymptomatic bradycardia.  Structurally her heart is normal.  TSH is normal.  This has been a longstanding issue.  This is normal physiology for her.  Should be of no cause for alarm.  Does not  require pacemaker.  She is able to increase her heart rate with movement. - We will check a Zio patch monitor.  Expect to see bradycardia in the 40s at times at night.  During the day would expect to see increases with activity.  We will make sure that she is chronotropically competent.  Mild mitral regurgitation - Source of murmur.  Also mild, should be of no clinical significance.  Could consider repeat echocardiogram in 5 to 10 years to demonstrate stability or sooner if shortness of breath or other concerning symptoms develop.       Medication Adjustments/Labs and Tests Ordered: Current medicines are reviewed at length with the patient today.  Concerns regarding medicines are outlined above.  Orders Placed This Encounter  Procedures  . LONG TERM MONITOR (3-14 DAYS)  . EKG 12-Lead   No orders of the defined types were placed in this encounter.   Patient Instructions  Medication Instructions:  The current medical regimen is effective;  continue present plan and medications.  *If you need a refill on your cardiac medications before your next appointment, please call your pharmacy*  Testing/Procedures: [place] Monitor Instructions   Your physician has requested you wear your ZIO patch monitor 14 days.   This is a single patch monitor.  [employer] supplies one patch monitor per enrollment.  Additional stickers are not available.   Please do not  apply patch if you will be having a Nuclear Stress Test, Echocardiogram, Cardiac CT, MRI, or Chest Xray during the time frame you would be wearing the monitor. The patch cannot be worn during these tests.  You cannot remove and re-apply the ZIO XT patch monitor.   Your ZIO patch monitor will be sent USPS Priority mail from [facility] directly to your home address. The monitor may also be mailed to a PO BOX if home delivery is not available.   It may take 3-5 days to receive your monitor after you have been enrolled.   Once you have received you monitor, please review enclosed instructions.  Your monitor has already been registered assigning a specific monitor serial # to you.   Applying the monitor   Shave hair from upper left chest.   Hold abrader disc by orange tab.  Rub abrader in 40 strokes over left upper chest as indicated in your monitor instructions.   Clean area with 4 enclosed alcohol pads .  Use all pads to assure are is cleaned thoroughly.  Let dry.   Apply patch as indicated in monitor instructions.  Patch will be place under collarbone on left side of chest with arrow pointing upward.   Rub patch adhesive wings for 2 minutes.Remove white label marked "1".  Remove white label marked "2".  Rub patch adhesive wings for 2 additional minutes.   While looking in a mirror, press and release button in center of patch.  A small green light will flash 3-4 times .  This will be your only indicator the monitor has been turned on.     Do not shower for the first 24 hours.  You may shower after the first 24 hours.   Press button if you feel a symptom. You will hear a small click.  Record Date, Time and Symptom in the Patient Log Book.   When you are ready to remove patch, follow instructions on last 2 pages of Patient Log Book.  Stick patch monitor onto last page of Patient Log Book.   Place Patient  Log Book in [facility] box.  Use locking tab on box and tape box closed securely.  The  [employer] has [employer] on it.  Please place in mailbox as soon as possible.  Your physician should have your test results approximately 7 days after the monitor has been mailed back to [facility].   Call [place] at [phone] if you have questions regarding your ZIO XT patch monitor.  Call them immediately if you see an orange light blinking on your monitor.   If your monitor falls off in less than 4 days contact our Monitor department at [phone].  If your monitor becomes loose or falls off after 4 days call [employer] at [phone] for suggestions on securing your monitor.   Follow-Up: At [facility], you and your health needs are our priority.  As part of our continuing mission to provide you with exceptional heart care, we have created designated Provider Care Teams.  These Care Teams include your primary Cardiologist (physician) and Advanced Practice Providers (APPs -  Physician Assistants and Nurse Practitioners) who all work together to provide you with the care you need, when you need it.  We recommend signing up for the patient portal called "MyChart".  Sign up information is provided on this After Visit Summary.  MyChart is used to connect with patients for Virtual Visits (Telemedicine).  Patients are able to view lab/test results, encounter notes, upcoming appointments, etc.  Non-urgent messages can be sent to your provider as well.   To learn more about what you can do with MyChart, go to [email or web address].    Your next appointment:   Follow up will be determined after the above testing.  Thank you for choosing [facility]!!        Signed, Candee Furbish, MD  02/07/2020 11:18 AM    [place]

## 2020-02-17 DIAGNOSIS — R001 Bradycardia, unspecified: Secondary | ICD-10-CM | POA: Diagnosis not present

## 2020-03-07 DIAGNOSIS — R001 Bradycardia, unspecified: Secondary | ICD-10-CM | POA: Diagnosis not present

## 2020-03-16 ENCOUNTER — Telehealth: Payer: Self-pay | Admitting: Cardiology

## 2020-03-16 NOTE — Telephone Encounter (Signed)
Spoke with pt who read the results of her monitor on MyChart.  Advised per Dr Marlou Porch - monitor results are reassuring.  She states understanding.

## 2020-03-16 NOTE — Telephone Encounter (Signed)
Kimberly Guerrero is returning Christine's call in regards to her heart monitor results. Please advise.

## 2020-03-16 NOTE — Telephone Encounter (Signed)
°·   Sinus rhythm with average heart rate of 53 bpm.   Episode of sinus bradycardia during sleep, 36 bpm, asymptomatic-benign   2 brief episodes of paroxysmal atrial tachycardia, longest lasting 9 beats at 107 bpm.   Rare PVCs   Heart rate increased adequately during activity.    Reassuring monitor  Candee Furbish, MD

## 2020-04-02 NOTE — Progress Notes (Signed)
SUBJECTIVE:   Chief compliant/HPI: annual examination  Kimberly Guerrero is a 70 y.o. who presents today for an annual exam.   PMH: HTN, allergic rhinitis, constipation, lumbar radiculopathy with back pain, osteoarthritis, osteopenia, trigger finger, asymptomatic   HTN:  BP: 118/72 today. Currently on Amlodipine 69m qHS. Endorses compliance. Non-smoker. Denies any chest pain, SOB, vision changes, or headaches.   Lab Results  Component Value Date   CREATININE 0.80 01/09/2020   CREATININE 0.79 04/01/2019   CREATININE 0.91 04/22/2016   HLD: Last lipid panel below. Currently on Lipitor 471mQD. Endorses compliance. Denies any muscles aches or weakness. The 10-year ASCVD risk score (GMikey BussingC Jr., et al., 2013) is: 6.7%   Lab Results  Component Value Date   CHOL 130 04/01/2019   HDL 85 04/01/2019   LDLCALC 34 04/01/2019   TRIG 42 04/01/2019   CHOLHDL 1.5 04/01/2019   Social History: LMP: post-menopausal Alcohol: None Tobacco: None Illicit Drugs: None Safe at home: Yes Depression/Suicidality: None Exercise: walking and aerobics daily.  Health Maintenance: Health Maintenance Due  Topic  . COLONOSCOPY (Pts 45-4941yrnsurance coverage will need to be confirmed)   . COLON CANCER SCREENING ANNUAL FOBT     Review of systems reviewed: notes insomnia  OBJECTIVE:   BP 118/72   Pulse (!) 45   Wt 143 lb 6.4 oz (65 kg)   SpO2 95%   BMI 21.18 kg/m   General: pleasant older thin female, sitting comfortably in exam chair, well nourished, well developed, in no acute distress with non-toxic appearance HEENT: normocephalic, atraumatic, moist mucous membranes, oropharynx clear without erythema or exudate, TM normal bilaterally  Neck: supple, normal ROM, non-tender without lymphadenopathy, no thyromegaly CV: regular rate and rhythm without murmurs, rubs, or gallops, no lower extremity edema, 2+ radial and pedal pulses bilaterally Lungs: clear to auscultation bilaterally with normal  work of breathing on room air, speaking in full sentences Abdomen: soft, non-tender, non-distended, normoactive bowel sounds Skin: warm, dry, no rashes Extremities: warm and well perfused MSK: , gait normal Neuro: Alert and oriented, speech normal  Depression screen [facility] 04/04/2020 01/09/2020 06/03/2019  Decreased Interest 0 0 0  Down, Depressed, Hopeless 0 0 0  PHQ - 2 Score 0 0 0  Altered sleeping 0 0 -  Tired, decreased energy 0 0 -  Change in appetite 0 0 -  Feeling bad or failure about yourself  0 0 -  Trouble concentrating 0 0 -  Moving slowly or fidgety/restless 0 0 -  Suicidal thoughts 0 0 -  PHQ-9 Score 0 0 -  Difficult doing work/chores Not difficult at all - -   ASSESSMENT/PLAN:   Annual Physical Exam: Patient here today for annual physical exam.  PMH, surgical history, and social history were reviewed. The following concerns below were discussed.   Essential hypertension Chronic, well controlled. - continue Amlodipine 5mg32m  Osteopenia Last DEXA scan in 2018 notable for T score of -1.5 making her moderate osteopenia. Currenntly on Vitamin D and calcium. - continue Vit D and calcium - Repeat DEXA scan  Hyperlipemia Chronic. Compliant and tolerating Lipitor 40mg64m- continue current medication - lipid panel today to monitor  Insomnia Chronic. Only some improvement with Melatonin nightly. Able to fall asleep with Melatonin but not stay asleep. She is active. She does sleep with the TV on sometimes thus sleep hygiene was discussed. Strongly encouraged to modify sleep hygiene and continue Melatonin PRN, however patient was insistent that alternative option  be offered. Discussed trial of Trazodone 12.53m - 269mqHS PRN if above recommendations do not work. Discussed potential side effects and to avoid using other than at night. Patient accepted these risks and rx provided. - sleep hygiene handout provided - see AVS - continue OTC Melatonin PRN - Trial Trazodone  12.5-25mg qHS prn for resistant insomnia - Follow up if no improvement   Annual Examination  See AVS for age appropriate recommendations  PHQ score 0, reviewed and discussed.  BP reviewed and at goal  Considered the following items based upon USPSTF recommendations: Diabetes screening: 5.8 on 12/2019 Screening for elevated cholesterol: ordered HIV testing: Negative 02/27/2015, low risk Hepatitis C: Negative 02/27/2015, low risk Hepatitis B: ordered Syphilis if at high risk: Not at high risk. Declined. GC/CT not at high risk and not ordered. Reviewed risk factors for latent tuberculosis and not indicated   Discussed family history, BRCA testing not indicated. Tool used to risk stratify was Pedigree.  Breast cancer screening: Last Mammogram 12/27/19. Due in November 2022 Colorectal cancer screening: yearly FOBT ordered Lung cancer screening: done. See documentation below regarding indications/risks/benefits.  Vaccinations: Up to date  Follow up in 1 year or sooner if indicated.    [place]

## 2020-04-04 ENCOUNTER — Ambulatory Visit (INDEPENDENT_AMBULATORY_CARE_PROVIDER_SITE_OTHER): Payer: Medicare Other | Admitting: Family Medicine

## 2020-04-04 ENCOUNTER — Encounter: Payer: Self-pay | Admitting: Family Medicine

## 2020-04-04 ENCOUNTER — Other Ambulatory Visit: Payer: Self-pay

## 2020-04-04 VITALS — BP 118/72 | HR 45 | Wt 143.4 lb

## 2020-04-04 DIAGNOSIS — Z1211 Encounter for screening for malignant neoplasm of colon: Secondary | ICD-10-CM

## 2020-04-04 DIAGNOSIS — I10 Essential (primary) hypertension: Secondary | ICD-10-CM | POA: Diagnosis not present

## 2020-04-04 DIAGNOSIS — Z1159 Encounter for screening for other viral diseases: Secondary | ICD-10-CM

## 2020-04-04 DIAGNOSIS — E7800 Pure hypercholesterolemia, unspecified: Secondary | ICD-10-CM

## 2020-04-04 DIAGNOSIS — Z0001 Encounter for general adult medical examination with abnormal findings: Secondary | ICD-10-CM

## 2020-04-04 DIAGNOSIS — M85859 Other specified disorders of bone density and structure, unspecified thigh: Secondary | ICD-10-CM | POA: Diagnosis not present

## 2020-04-04 DIAGNOSIS — R001 Bradycardia, unspecified: Secondary | ICD-10-CM

## 2020-04-04 DIAGNOSIS — G4700 Insomnia, unspecified: Secondary | ICD-10-CM

## 2020-04-04 MED ORDER — TRAZODONE HCL 50 MG PO TABS: 25.0000 mg | ORAL_TABLET | Freq: Every day | ORAL | 1 refills | Status: DC

## 2020-04-04 NOTE — Assessment & Plan Note (Signed)
Chronic, well controlled. - continue Amlodipine 5mg  QD

## 2020-04-04 NOTE — Assessment & Plan Note (Signed)
Chronic. Compliant and tolerating Lipitor 40mg  QD - continue current medication - lipid panel today to monitor

## 2020-04-04 NOTE — Assessment & Plan Note (Addendum)
Chronic. Only some improvement with Melatonin nightly. Able to fall asleep with Melatonin but not stay asleep. She is active. She does sleep with the TV on sometimes thus sleep hygiene was discussed. Strongly encouraged to modify sleep hygiene and continue Melatonin PRN, however patient was insistent that alternative option be offered. Discussed trial of Trazodone 12.5mg  - 25mg  qHS PRN if above recommendations do not work. Discussed potential side effects and to avoid using other than at night. Patient accepted these risks and rx provided. - sleep hygiene handout provided - see AVS - continue OTC Melatonin PRN - Trial Trazodone 12.5-25mg  qHS prn for resistant insomnia - Follow up if no improvement

## 2020-04-04 NOTE — Assessment & Plan Note (Signed)
Last DEXA scan in 2018 notable for T score of -1.5 making her moderate osteopenia. Currenntly on Vitamin D and calcium. - continue Vit D and calcium - Repeat DEXA scan

## 2020-04-04 NOTE — Patient Instructions (Addendum)
Good sleep habits (sometimes referred to as "sleep hygiene") can help you get a good night's sleep. Some habits that can improve your sleep health:  Be consistent. Go to bed at the same time each night and get up at the same time each morning, including on the weekends (7 1/2 to 8 1/2 hours for the average adult) Make sure your bedroom is quiet, dark, relaxing, and at a comfortable temperature Remove electronic devices, such as TVs, computers, and smart phones, from the bedroom. Try not to use or play on electronic devices at bedtime. If you like to read at bedtime on an electronic device, try to dim the background light as much as possible. Avoid large meals, caffeine, and alcohol before bedtime (at least 2 hours before bedtime).  Get some exercise. Being physically active during the day can help you fall asleep more easily at night. But avoid exercise 2 hours prior to bedtime For stress relief, try meditation, deep breathing exercises (there are many books and CDs available), a white noise machine or fan can help to diffuse other noise distractors, such as traffic noise. Avoid narcotic pain medication close to bedtime, as opioids/narcotics can suppress breathing drive and breathing effort.  Never mix alcohol and sedating medications!  It was a pleasure to see you today!  Thank you for choosing [facility] for your primary care.   Our plans for today were:  Please see the recommendations for insomnia above. You can also try to take an additional dose of melatonin upon re-waking to help you go back to sleep. This is overall safer to do. If none of these work, then you can try Trazodone: 1/4th or 1/2 tablet at night to help you sleep.    We are checking some labs today, I will call you if they are abnormal will send you a MyChart message or a letter if they are normal.  If you do not hear about your labs in the next 2 weeks please let us know.  BRING ALL OF YOUR MEDICATIONS WITH YOU TO  EVERY VISIT   You should return to our clinic in 1 year for annual.   Best Wishes,   [name], DO

## 2020-04-05 LAB — LIPID PANEL
Chol/HDL Ratio: 1.7 ratio (ref 0.0–4.4)
Cholesterol, Total: 129 mg/dL (ref 100–199)
HDL: 74 mg/dL (ref >39)
LDL Chol Calc (NIH): 43 mg/dL (ref 0–99)
Triglycerides: 53 mg/dL (ref 0–149)
VLDL Cholesterol Cal: 12 mg/dL (ref 5–40)

## 2020-04-05 LAB — HEPATITIS B SURFACE ANTIGEN: Hepatitis B Surface Ag: NEGATIVE

## 2020-04-06 ENCOUNTER — Encounter: Payer: Self-pay | Admitting: Family Medicine

## 2020-04-06 ENCOUNTER — Telehealth: Payer: Self-pay

## 2020-04-06 NOTE — Telephone Encounter (Signed)
-----   Message from Carolyne Littles sent at 04/06/2020 10:43 AM EST ----- Regarding: refill request Pt is asking for a refill on Acetaminophen-Codeine 300-30 MG tablet. She has enough until next week. Pharmacy is same.

## 2020-04-09 ENCOUNTER — Other Ambulatory Visit: Payer: Self-pay

## 2020-04-09 DIAGNOSIS — Z1211 Encounter for screening for malignant neoplasm of colon: Secondary | ICD-10-CM | POA: Diagnosis not present

## 2020-04-09 MED ORDER — ACETAMINOPHEN-CODEINE 300-30 MG PO TABS: ORAL_TABLET | ORAL | 0 refills | Status: DC

## 2020-04-11 LAB — FECAL OCCULT BLOOD, IMMUNOCHEMICAL: Fecal Occult Bld: NEGATIVE

## 2020-04-27 ENCOUNTER — Other Ambulatory Visit: Payer: Self-pay | Admitting: Family Medicine

## 2020-04-30 ENCOUNTER — Other Ambulatory Visit: Payer: Self-pay | Admitting: Family Medicine

## 2020-04-30 DIAGNOSIS — G4700 Insomnia, unspecified: Secondary | ICD-10-CM

## 2020-05-08 ENCOUNTER — Telehealth: Payer: Self-pay

## 2020-05-09 ENCOUNTER — Telehealth: Payer: Self-pay | Admitting: *Deleted

## 2020-05-09 MED ORDER — ACETAMINOPHEN-CODEINE 300-30 MG PO TABS: ORAL_TABLET | ORAL | 0 refills | Status: DC

## 2020-05-09 NOTE — Telephone Encounter (Signed)
Sent refill See other note from today\ Kimberly Guerrero

## 2020-05-09 NOTE — Telephone Encounter (Signed)
Refill sent in Kimberly Guerrero

## 2020-06-05 ENCOUNTER — Telehealth: Payer: Self-pay | Admitting: *Deleted

## 2020-06-08 ENCOUNTER — Telehealth: Payer: Self-pay

## 2020-06-08 ENCOUNTER — Other Ambulatory Visit: Payer: Self-pay | Admitting: Family Medicine

## 2020-06-08 MED ORDER — ACETAMINOPHEN-CODEINE 300-30 MG PO TABS: ORAL_TABLET | ORAL | 0 refills | Status: DC

## 2020-06-08 NOTE — Telephone Encounter (Signed)
-----   Message from Carolyne Littles sent at 06/08/2020  9:07 AM EDT ----- Regarding: phone message Pt called asking for a refill of Acetaminophen-Codeine 300-30 MG tablet.

## 2020-06-08 NOTE — Telephone Encounter (Signed)
Refill

## 2020-06-21 ENCOUNTER — Other Ambulatory Visit: Payer: Self-pay | Admitting: Family Medicine

## 2020-06-21 NOTE — Telephone Encounter (Signed)
Hi Dr. Nori Riis, I am covering Dr. Oralia Rud box this week.  This patient requested a refill for her gabapentin.  It looks like you were managing this for radiculopathy [facility].  Just to avoid confusion on who is prescribing this medication and for what reasons (especially as we are transitioning out), would you mind sending in a refill for this patient?   Kimberly Guerrero

## 2020-07-10 ENCOUNTER — Other Ambulatory Visit: Payer: Self-pay | Admitting: Family Medicine

## 2020-07-10 ENCOUNTER — Telehealth: Payer: Self-pay

## 2020-07-10 NOTE — Telephone Encounter (Signed)
-----   Message from Carolyne Littles sent at 07/10/2020  4:28 PM EDT ----- Regarding: refill request Rfefill request per patient  Acetaminophen-Codeine 300-30 MG tablet

## 2020-07-11 MED ORDER — ACETAMINOPHEN-CODEINE 300-30 MG PO TABS: ORAL_TABLET | ORAL | 0 refills | Status: DC

## 2020-08-04 ENCOUNTER — Other Ambulatory Visit: Payer: Self-pay | Admitting: Family Medicine

## 2020-08-06 ENCOUNTER — Telehealth: Payer: Self-pay

## 2020-08-06 MED ORDER — ACETAMINOPHEN-CODEINE 300-30 MG PO TABS: ORAL_TABLET | ORAL | 0 refills | Status: DC

## 2020-08-06 NOTE — Telephone Encounter (Signed)
-----   Message from Kimberly Guerrero sent at 08/06/2020 10:20 AM EDT ----- Regarding: REFILL REQUEST Pt called asking for a refill on Acetaminophen-Codeine 300-30 MG tablet. She has enough for a couple more days.

## 2020-09-02 DIAGNOSIS — M65311 Trigger thumb, right thumb: Secondary | ICD-10-CM | POA: Diagnosis not present

## 2020-09-04 ENCOUNTER — Telehealth: Payer: Self-pay

## 2020-09-04 NOTE — Telephone Encounter (Signed)
-----   Message from Carolyne Littles sent at 09/04/2020  3:42 PM EDT ----- Regarding: med refill Pt is asking for refill on Acetaminophen-Codeine 300-30 MG tablet

## 2020-09-05 MED ORDER — ACETAMINOPHEN-CODEINE 300-30 MG PO TABS: ORAL_TABLET | ORAL | 0 refills | Status: DC

## 2020-09-13 ENCOUNTER — Other Ambulatory Visit: Payer: Self-pay | Admitting: Family Medicine

## 2020-09-13 DIAGNOSIS — G4700 Insomnia, unspecified: Secondary | ICD-10-CM

## 2020-09-14 ENCOUNTER — Ambulatory Visit
Admission: RE | Admit: 2020-09-14 | Discharge: 2020-09-14 | Disposition: A | Discharge status: Home / Self Care | Payer: Medicare Other | Source: Ambulatory Visit | Attending: Family Medicine | Admitting: Family Medicine

## 2020-09-14 ENCOUNTER — Other Ambulatory Visit: Payer: Self-pay

## 2020-09-14 DIAGNOSIS — M85859 Other specified disorders of bone density and structure, unspecified thigh: Secondary | ICD-10-CM

## 2020-09-14 DIAGNOSIS — Z780 Asymptomatic menopausal state: Secondary | ICD-10-CM | POA: Diagnosis not present

## 2020-09-14 DIAGNOSIS — M8589 Other specified disorders of bone density and structure, multiple sites: Secondary | ICD-10-CM | POA: Diagnosis not present

## 2020-09-14 MED ORDER — GABAPENTIN 100 MG PO CAPS: ORAL_CAPSULE | ORAL | 2 refills | Status: DC

## 2020-09-26 ENCOUNTER — Ambulatory Visit: Payer: Medicare Other | Admitting: Sports Medicine

## 2020-10-05 ENCOUNTER — Telehealth: Payer: Self-pay

## 2020-10-05 MED ORDER — ACETAMINOPHEN-CODEINE 300-30 MG PO TABS: ORAL_TABLET | ORAL | 0 refills | Status: DC

## 2020-10-05 NOTE — Telephone Encounter (Signed)
-----   Message from Kimberly Guerrero sent at 10/05/2020 11:23 AM EDT ----- Regarding: refill request Pt is asking for a refill on Acetaminophen-Codeine 300-30 MG tablet.

## 2020-10-09 ENCOUNTER — Encounter: Payer: Self-pay | Admitting: Family Medicine

## 2020-10-09 ENCOUNTER — Ambulatory Visit (INDEPENDENT_AMBULATORY_CARE_PROVIDER_SITE_OTHER): Payer: Medicare Other | Admitting: Family Medicine

## 2020-10-09 ENCOUNTER — Other Ambulatory Visit: Payer: Self-pay

## 2020-10-09 VITALS — BP 124/64 | HR 51 | Ht 69.0 in | Wt 134.8 lb

## 2020-10-09 DIAGNOSIS — M85859 Other specified disorders of bone density and structure, unspecified thigh: Secondary | ICD-10-CM | POA: Diagnosis not present

## 2020-10-09 DIAGNOSIS — L309 Dermatitis, unspecified: Secondary | ICD-10-CM

## 2020-10-09 DIAGNOSIS — Z23 Encounter for immunization: Secondary | ICD-10-CM | POA: Diagnosis not present

## 2020-10-09 MED ORDER — TRIAMCINOLONE ACETONIDE 0.5 % EX OINT: 1.0000 "application " | TOPICAL_OINTMENT | Freq: Two times a day (BID) | CUTANEOUS | 0 refills | Status: DC

## 2020-10-09 MED ORDER — CALCIUM CARBONATE-VITAMIN D 500-200 MG-UNIT PO TABS: 2.0000 | ORAL_TABLET | Freq: Every day | ORAL | 1 refills | Status: AC

## 2020-10-09 NOTE — Progress Notes (Signed)
    SUBJECTIVE:   CHIEF COMPLAINT / HPI:   Rash This is a new (Rash on the back of the thigh B/L started 2 weeks ago) problem. Rash characteristics: The rash is slightly red, rough appearing and occasionally itchy. She was exposed to nothing. Pertinent negatives include no fever. Treatments tried: She applies vaseline. The treatment provided no relief.  Right is more on her right thigh than the left. She denies change in body lotion or any known exposure/irritant.   Osteopenia: She stated that she had a recent bone scan but was never contacted about her test result. She wants more information about Calcium and vitamin D supplement which she was asked to initiate years ago by her old PCP.  PERTINENT  PMH / PSH: PMHx reviewed/  OBJECTIVE:   BP 124/64   Pulse (!) 51   Ht '5\' 9"'$  (1.753 m)   Wt 134 lb 12.8 oz (61.1 kg)   SpO2 100%   BMI 19.91 kg/m    NB: HR low. She stated this is chronic and had been fully worked up in the past. No new concerns.  Physical Exam Vitals and nursing note reviewed.  Cardiovascular:     Rate and Rhythm: Normal rate and regular rhythm.     Heart sounds: Normal heart sounds. No murmur heard. Pulmonary:     Effort: Pulmonary effort is normal. No respiratory distress.     Breath sounds: Normal breath sounds. No wheezing.  Musculoskeletal:     Right lower leg: No edema.     Left lower leg: No edema.     Comments: Dry, scaly, rough surface patch on her right upper thigh posteriorly, a few inches below her gluteus, measuring approximately 5 cm by 6 cm circumferentially.      ASSESSMENT/PLAN:  Dermatitis - ?? Contact vs Atopic (less likely). Irritant unknown. Trial of topical Steroid discussed. Triamcinolone escribed.  Osteopenia Test result reviewed consistent with osteopenia. Result discussed with her. May continue with Vit D and Calcium supplement. OscalD escribed. Advised her to f/u with PCP for medication adjustment and management. She agreed  with the plan.     Andrena Mews, MD [place]

## 2020-10-09 NOTE — Patient Instructions (Signed)
Osteopenia Osteopenia is a loss of thickness (density) inside the bones. Another name for osteopenia is low bone mass. Mild osteopenia is a normal part of aging. It is not a disease, and it does not cause symptoms. However, if you have osteopenia and continue to lose bone mass, you could develop a condition that causes the bones to become thin and break more easily (osteoporosis). Osteoporosis can cause you to lose some height, have back pain, and have a stooped posture. Although osteopenia is not a disease, making changes to your lifestyle and diet can help to prevent osteopenia from developing into osteoporosis. What are the causes? Osteopenia is caused by loss of calcium in the bones. Bones are constantly changing. Old bone cells are continually being replaced with new bone cells. This process builds new bone. The mineral calcium is needed to build new bone and maintain bone density. Bone density is usually highest around age 38. After that, most people's bodies cannot replace all the bone they have lost with new bone. What increases the risk? You are more likely to develop this condition if: You are older than age 70. You are a woman who went through menopause early. You have a long illness that keeps you in bed. You do not get enough exercise. You lack certain nutrients (malnutrition). You have an overactive thyroid gland (hyperthyroidism). You use products that contain nicotine or tobacco, such as cigarettes, e-cigarettes and chewing tobacco, or you drink a lot of alcohol. You are taking medicines that weaken the bones, such as steroids. What are the signs or symptoms? This condition does not cause any symptoms. You may have a slightly higher risk for bone breaks (fractures), so getting fractures more easily than normal may be an indication of osteopenia. How is this diagnosed? This condition may be diagnosed based on an X-ray exam that measures bone density (dual-energy X-ray  absorptiometry, or DEXA). This test can measure bone density in your hips, spine, and wrists. Osteopenia has no symptoms, so this condition is usually diagnosed after a routine bone density screening test is done for osteoporosis. This routine screening is usually done for: Women who are age 34 or older. Men who are age 6 or older. If you have risk factors for osteopenia, you may have the screening test at an earlier age. How is this treated? Making dietary and lifestyle changes can lower your risk for osteoporosis. If you have severe osteopenia that is close to becoming osteoporosis, this condition can be treated with medicines and dietary supplements such as calcium and vitamin D. These supplements help to rebuild bone density. Follow these instructions at home: Eating and drinking Eat a diet that is high in calcium and vitamin D. Calcium is found in dairy products, beans, salmon, and leafy green vegetables like spinach and broccoli. Look for foods that have vitamin D and calcium added to them (fortified foods), such as orange juice, cereal, and bread.  Lifestyle Do 30 minutes or more of a weight-bearing exercise every day, such as walking, jogging, or playing a sport. These types of exercises strengthen the bones. Do not use any products that contain nicotine or tobacco, such as cigarettes, e-cigarettes, and chewing tobacco. If you need help quitting, ask your health care provider. Do not drink alcohol if: Your health care provider tells you not to drink. You are pregnant, may be pregnant, or are planning to become pregnant. If you drink alcohol: Limit how much you use to: 0-1 drink a day for women. 0-2 drinks  a day for men. Be aware of how much alcohol is in your drink. In the [place] one drink equals one 12 oz bottle of beer (355 mL), one 5 oz glass of wine (148 mL), or one 1 oz glass of hard liquor (44 mL). General instructions Take over-the-counter and prescription medicines only as  told by your health care provider. These include vitamins and supplements. Take precautions at home to lower your risk of falling, such as: Keeping rooms well-lit and free of clutter, such as cords. Installing safety rails on stairs. Using rubber mats in the bathroom or other areas that are often wet or slippery. Keep all follow-up visits. This is important. Contact a health care provider if: You have not had a bone density screening for osteoporosis and you are: A woman who is age 92 or older. A man who is age 32 or older. You are a postmenopausal woman who has not had a bone density screening for osteoporosis. You are older than age 26 and you want to know if you should have bone density screening for osteoporosis. Summary Osteopenia is a loss of thickness (density) inside the bones. Another name for osteopenia is low bone mass. Osteopenia is not a disease, but it may increase your risk for a condition that causes the bones to become thin and break more easily (osteoporosis). You may be at risk for osteopenia if you are older than age 30 or if you are a woman who went through early menopause. Osteopenia does not cause any symptoms, but it can be diagnosed with a bone density screening test. Dietary and lifestyle changes are the first treatment for osteopenia. These may lower your risk for osteoporosis. This information is not intended to replace advice given to you by your health care provider. Make sure you discuss any questions you have with your health care provider. Document Revised: 06/30/2019 Document Reviewed: 06/30/2019 Elsevier Patient Education  [place].

## 2020-10-09 NOTE — Assessment & Plan Note (Signed)
Test result reviewed consistent with osteopenia. Result discussed with her. May continue with Vit D and Calcium supplement. OscalD escribed. Advised her to f/u with PCP for medication adjustment and management. She agreed with the plan.

## 2020-11-07 ENCOUNTER — Telehealth: Payer: Self-pay

## 2020-11-07 MED ORDER — ACETAMINOPHEN-CODEINE 300-30 MG PO TABS: ORAL_TABLET | ORAL | 0 refills | Status: DC

## 2020-11-07 NOTE — Telephone Encounter (Signed)
-----   Message from Carolyne Littles sent at 11/07/2020  8:31 AM EDT ----- Regarding: refill request Refill request from patient for Acetaminophen-Codeine 300-30 MG tablet. She has a couple of days left. Pharmacy is in [place].

## 2020-11-23 ENCOUNTER — Other Ambulatory Visit: Payer: Self-pay | Admitting: Family Medicine

## 2020-11-23 DIAGNOSIS — Z1231 Encounter for screening mammogram for malignant neoplasm of breast: Secondary | ICD-10-CM

## 2020-12-05 ENCOUNTER — Telehealth: Payer: Self-pay

## 2020-12-05 NOTE — Telephone Encounter (Signed)
-----   Message from Carolyne Littles sent at 12/05/2020  8:09 AM EST ----- Regarding: refill request Pt is asking for refill onAcetaminophen-Codeine 300-30 MG tablet. She is aware it's not due until 11/12 and Dr. Nori Riis isn't in the office until Friday. Let me know when its done and I will call patient. Thanks!

## 2020-12-06 MED ORDER — ACETAMINOPHEN-CODEINE 300-30 MG PO TABS: ORAL_TABLET | ORAL | 0 refills | Status: DC

## 2020-12-08 ENCOUNTER — Other Ambulatory Visit: Payer: Self-pay | Admitting: Family Medicine

## 2020-12-08 DIAGNOSIS — I10 Essential (primary) hypertension: Secondary | ICD-10-CM

## 2020-12-28 ENCOUNTER — Ambulatory Visit
Admission: RE | Admit: 2020-12-28 | Discharge: 2020-12-28 | Disposition: A | Discharge status: Home / Self Care | Payer: Medicare Other | Source: Ambulatory Visit | Attending: Family Medicine | Admitting: Family Medicine

## 2020-12-28 ENCOUNTER — Other Ambulatory Visit: Payer: Self-pay

## 2020-12-28 DIAGNOSIS — Z1231 Encounter for screening mammogram for malignant neoplasm of breast: Secondary | ICD-10-CM

## 2021-01-08 ENCOUNTER — Encounter (HOSPITAL_COMMUNITY): Payer: Self-pay | Admitting: Emergency Medicine

## 2021-01-08 ENCOUNTER — Ambulatory Visit (HOSPITAL_COMMUNITY)
Admission: EM | Admit: 2021-01-08 | Discharge: 2021-01-08 | Disposition: A | Discharge status: Home / Self Care | Payer: Medicare Other | Attending: Family Medicine | Admitting: Family Medicine

## 2021-01-08 ENCOUNTER — Other Ambulatory Visit: Payer: Self-pay

## 2021-01-08 DIAGNOSIS — N841 Polyp of cervix uteri: Secondary | ICD-10-CM

## 2021-01-08 DIAGNOSIS — N939 Abnormal uterine and vaginal bleeding, unspecified: Secondary | ICD-10-CM | POA: Diagnosis not present

## 2021-01-08 LAB — POCT URINALYSIS DIPSTICK, ED / UC
Bilirubin Urine: NEGATIVE
Glucose, UA: NEGATIVE mg/dL
Nitrite: NEGATIVE
Protein, ur: NEGATIVE mg/dL
Specific Gravity, Urine: 1.025 (ref 1.005–1.030)
Urobilinogen, UA: 1 mg/dL (ref 0.0–1.0)
pH: 6 (ref 5.0–8.0)

## 2021-01-08 NOTE — ED Triage Notes (Addendum)
Pt is present today with vaginal bleeding. Pt states she noticed the sx Saturday. Pt denies any other sx

## 2021-01-08 NOTE — ED Provider Notes (Signed)
[place]    CSN: [id number] Arrival date & time: 01/08/21  0805      History   Chief Complaint Chief Complaint  Patient presents with   Hematuria    HPI Kimberly Guerrero is a 70 y.o. female.   She is here for "uterine bleeding" x 4 days;  she is wearing a pad, and there is blood on the pad.  She does not think this is coming from her urine, bladder.   No abd pain.  No dizziness or light headedness.  Denies any other urinary symptoms such as pain, frequency.  Uterus is intact.  First episode of post-menopausal bleeding.  She did call her pcp in several days;    Past Medical History:  Diagnosis Date   Back pain    Chronic back pain    Bradycardia    Chronic fatigue    Heart murmur    Hypertension    Osteoarthritis    Prediabetes     Patient Active Problem List   Diagnosis Date Noted   Bradycardia 01/09/2020   Essential hypertension 01/09/2020   Heart murmur previously undiagnosed 01/09/2020   Spinal stenosis, lumbar region, with neurogenic claudication 08/12/2019   Lumbar radiculopathy 08/12/2019   Drug-induced constipation 08/12/2019   Trigger finger 07/01/2019   Chronic right SI joint pain 06/05/2017   Osteoarthritis of facet joint of lumbar spine 06/05/2017   Prediabetes 05/27/2016   Hyperlipemia 05/27/2016   Osteopenia 05/27/2016   Allergic rhinitis 11/03/2014   Osteoarthritis 06/06/2011   Back pain 07/02/2006   Insomnia 07/02/2006    No past surgical history on file.  OB History   No obstetric history on file.      Home Medications    Prior to Admission medications   Medication Sig Start Date End Date Taking? Authorizing Provider  Acetaminophen-Codeine 300-30 MG tablet Take one tablet by mouth every 6 hours as needed for back pain and adjust any additional acetaminophen OTC to max of 2 additional 500 mg tabs OTC  a day 12/06/20   Dickie La, MD  amLODipine (NORVASC) 5 MG tablet TAKE 1 TABLET(5 MG) BY MOUTH AT BEDTIME 12/10/20    Carollee Leitz, MD  atorvastatin (LIPITOR) 40 MG tablet TAKE 1 TABLET(40 MG) BY MOUTH DAILY 04/27/20   Mullis, Kiersten P, DO  calcium-vitamin D (OSCAL WITH D) 500-200 MG-UNIT tablet Take 2 tablets by mouth daily with breakfast. 10/09/20   Kinnie Feil, MD  cetirizine (ZYRTEC) 10 MG tablet Take 1 tablet (10 mg total) by mouth daily. 07/31/19   Mullis, Kiersten P, DO  fluticasone (FLONASE) 50 MCG/ACT nasal spray SHAKE LIQUID AND USE 2 SPRAYS IN EACH NOSTRIL DAILY AS NEEDED FOR ALLERGIES OR RHINITIS Patient not taking: Reported on 10/09/2020 03/17/17   Lovenia Kim, MD  gabapentin (NEURONTIN) 100 MG capsule TAKE 4 CAPSULES BY MOUTH EVERY MORNING AND 4 CAPSULES AT LUNCH, THEN 4 CAPSULES AT BEDTIME 12/10/20   Carollee Leitz, MD  ibuprofen (ADVIL) 800 MG tablet Take 1 tablet (800 mg total) by mouth every 12 (twelve) hours as needed. Patient not taking: Reported on 10/09/2020 04/27/19   [name] P, DO  traZODone (DESYREL) 50 MG tablet TAKE 1/2 TO 1 TABLET(25 TO 50 MG) BY MOUTH AT BEDTIME 09/14/20   Maness, Philip, MD  triamcinolone ointment (KENALOG) 0.5 % Apply 1 application topically 2 (two) times daily. 10/09/20   Kinnie Feil, MD    Family History Family History  Problem Relation Age of Onset  Breast cancer Neg Hx     Social History Social History   Tobacco Use   Smoking status: Never   Smokeless tobacco: Never  Vaping Use   Vaping Use: Never used  Substance Use Topics   Alcohol use: No    Alcohol/week: 0.0 standard drinks   Drug use: No     Allergies   Tramadol   Review of Systems Review of Systems  Constitutional: Negative.   HENT: Negative.    Respiratory: Negative.    Cardiovascular: Negative.   Gastrointestinal: Negative.   Genitourinary:  Positive for vaginal bleeding. Negative for dysuria, frequency, hematuria and urgency.    Physical Exam Triage Vital Signs ED Triage Vitals [01/08/21 0827]  Enc Vitals Group     BP      Pulse      Resp      Temp       Temp src      SpO2      Weight      Height      Head Circumference      Peak Flow      Pain Score 0     Pain Loc      Pain Edu?      Excl. in [place]?    No data found.  Updated Vital Signs There were no vitals taken for this visit.  Visual Acuity Right Eye Distance:   Left Eye Distance:   Bilateral Distance:    Right Eye Near:   Left Eye Near:    Bilateral Near:     Physical Exam Constitutional:      Appearance: Normal appearance.  HENT:     Head: Normocephalic.  Cardiovascular:     Rate and Rhythm: Normal rate and regular rhythm.  Pulmonary:     Effort: Pulmonary effort is normal.     Breath sounds: Normal breath sounds.  Abdominal:     General: Abdomen is flat.     Palpations: Abdomen is soft.  Genitourinary:    Vagina: Normal.     Cervix: Lesion present. No cervical bleeding.     Uterus: Normal.      Comments: Large growth/polyp extending from the cervical os;  no active bleeding noted;  Musculoskeletal:     Cervical back: Normal range of motion and neck supple.  Skin:    General: Skin is warm.  Neurological:     General: No focal deficit present.     Mental Status: She is alert and oriented to person, place, and time.  Psychiatric:        Mood and Affect: Mood normal.        Behavior: Behavior normal.     UC Treatments / Results  Labs (all labs ordered are listed, but only abnormal results are displayed) Labs Reviewed  POCT URINALYSIS DIPSTICK, ED / UC - Abnormal; Notable for the following components:      Result Value   Ketones, ur TRACE (*)    Hgb urine dipstick TRACE (*)    Leukocytes,Ua TRACE (*)    All other components within normal limits    EKG   Radiology No results found.  Procedures Procedures (including critical care time)  Medications Ordered in UC Medications - No data to display  Initial Impression / Assessment and Plan / UC Course  I have reviewed the triage vital signs and the nursing notes.  Pertinent labs & imaging  results that were available during my care of the patient were reviewed by me  and considered in my medical decision making (see chart for details).  Patient is describing vaginal bleeding;  no urinary symptoms;  UA does not support uti;  she has a moderate sized growth/polyp extending from the cervical os;  no active bleeding today;  vitals normal;  she needs to f/u with her pcp;  given names for several ob/gyns as well in the area;      Final Clinical Impressions(s) / UC Diagnoses   Final diagnoses:  Vaginal bleeding  Cervical polyp     Discharge Instructions      You have had some vaginal bleeding.  There is no active bleeding noted today on exam, but you do have a polyp/growth at the cervix from the uterus.  You do need to see your primary care provider and ob/gyn for follow up.  You  may attempt to call [facility] ob/gyn at [phone]; or [facility] at [phone];       ED Prescriptions   None    PDMP not reviewed this encounter.   Rondel Oh, MD 01/08/21 [phone]

## 2021-01-08 NOTE — Discharge Instructions (Addendum)
You have had some vaginal bleeding.  There is no active bleeding noted today on exam, but you do have a polyp/growth at the cervix from the uterus.  You do need to see your primary care provider and ob/gyn for follow up.  You  may attempt to call [facility] ob/gyn at [phone]; or [facility] at [phone];

## 2021-01-09 ENCOUNTER — Telehealth: Payer: Self-pay

## 2021-01-09 NOTE — Telephone Encounter (Signed)
-----   Message from Carolyne Littles sent at 01/09/2021  8:47 AM EST ----- Regarding: refill request  Pt is asking for a refill on Acetaminophen-Codeine 300-30 MG tablet

## 2021-01-10 ENCOUNTER — Ambulatory Visit (INDEPENDENT_AMBULATORY_CARE_PROVIDER_SITE_OTHER): Payer: Medicare Other | Admitting: Family Medicine

## 2021-01-10 ENCOUNTER — Other Ambulatory Visit: Payer: Self-pay

## 2021-01-10 ENCOUNTER — Other Ambulatory Visit: Payer: Self-pay | Admitting: Family Medicine

## 2021-01-10 VITALS — BP 142/74 | HR 65 | Ht 69.0 in | Wt 138.0 lb

## 2021-01-10 DIAGNOSIS — N950 Postmenopausal bleeding: Secondary | ICD-10-CM | POA: Diagnosis not present

## 2021-01-10 DIAGNOSIS — N841 Polyp of cervix uteri: Secondary | ICD-10-CM | POA: Diagnosis not present

## 2021-01-10 MED ORDER — ACETAMINOPHEN-CODEINE 300-30 MG PO TABS: ORAL_TABLET | ORAL | 0 refills | Status: DC

## 2021-01-10 NOTE — Telephone Encounter (Signed)
Done THANKS! Kimberly Guerrero

## 2021-01-11 NOTE — Progress Notes (Signed)
PROCEDURE NOTE Patient given informed consent for speculum vaginal exam and possible polypectomy. Speculum inserted in usual fashion. Small amount of blood was seen at the OS. There was also a protruding stalked mass. Mass was grasped with forceps and easily removed with separation of the stalk at the level of the OS. There was a small amopunt of stalk remaining and a Fox swab was used to hold pressure on the OS until hemostasis was obtained. All equipment removed. Specimen sent for pathology.  I discussed the possible differential with the patient which includes: benign cervical polyp vs neoplasia. I will notify her of pathology as soon as it is reported.  She will likely ave some small amount of spotting over the next 24 hours. She should call or be seen immediately for any significant bleeding, pain, fever or other new symptoms. Patient expressed understanding.

## 2021-01-13 ENCOUNTER — Encounter: Payer: Self-pay | Admitting: Family Medicine

## 2021-01-13 DIAGNOSIS — N950 Postmenopausal bleeding: Secondary | ICD-10-CM | POA: Insufficient documentation

## 2021-01-13 NOTE — Progress Notes (Signed)
° ° °  SUBJECTIVE:   CHIEF COMPLAINT / HPI: vaginal bleeding  Reports vaginal bleeding since Saturday.  Was seen in ED for same 12/13.  Speculum exam at that time showed possible lesion/polyp on cervix.  Patient continues to have bleeding.  Reports pinkish discharge in toilet. Changes sanitary napkin daily.  Denies any weight loss, abdominal pain, dysuria.  PERTINENT  PMH / PSH:  Post menopausal  OBJECTIVE:   BP (!) 142/74    Pulse 65    Ht 5\' 9"  (1.753 m)    Wt 138 lb (62.6 kg)    SpO2 100%    BMI 20.38 kg/m    General: Alert, no acute distress Cardio: Normal S1 and S2, RRR, no r/m/g Pulm: CTAB, normal work of breathing Abdomen: Bowel sounds normal. Abdomen soft and non tender Pelvic Exam deferred to colposcopy clinic today.   ASSESSMENT/PLAN:   Postmenopausal vaginal bleeding Will send to colpo clinic today for visualization of cervix and possible removal of lesion.  Discuss with patient concern for cervical cancer and referral to [facility] pending results [place] [facility].  Patient agrees to evaluation and possible removal pending evaluation Follow up with results     Carollee Leitz, MD [place]

## 2021-01-13 NOTE — Assessment & Plan Note (Signed)
Will send to [place] clinic today for visualization of cervix and possible removal of lesion.  Discuss with patient concern for cervical cancer and referral to [facility] pending results [place] [facility].  Patient agrees to evaluation and possible removal pending evaluation Follow up with results

## 2021-01-16 ENCOUNTER — Encounter: Payer: Self-pay | Admitting: Family Medicine

## 2021-01-16 ENCOUNTER — Other Ambulatory Visit: Payer: Self-pay

## 2021-01-16 ENCOUNTER — Ambulatory Visit (INDEPENDENT_AMBULATORY_CARE_PROVIDER_SITE_OTHER): Payer: Medicare Other | Admitting: Family Medicine

## 2021-01-16 DIAGNOSIS — N950 Postmenopausal bleeding: Secondary | ICD-10-CM | POA: Diagnosis not present

## 2021-01-16 DIAGNOSIS — M48062 Spinal stenosis, lumbar region with neurogenic claudication: Secondary | ICD-10-CM | POA: Diagnosis not present

## 2021-01-16 MED ORDER — ACETAMINOPHEN-CODEINE 300-30 MG PO TABS: ORAL_TABLET | ORAL | 0 refills | Status: DC

## 2021-01-16 NOTE — Patient Instructions (Signed)
Your polyp was benign (NOT cancerous).  I have scheduled you back to see me in January and we can see if you are still spotting and how the new dose of pain meds is working for your back.  IF you start having a return of HEAVY bleeding or other new symptoms, call the ofice and I wil lsee you sooner.  Have a Happy Holiday!

## 2021-01-18 NOTE — Assessment & Plan Note (Signed)
I agree to increase in her pain medicine.  Given her spinal stenosis, there is not an ideal surgery for that.  She has done really well with relatively low-dose opioid management.  We will increase that and I will follow her up in 1 month.  We again discussed red flags such as severe change in symptoms, lower extremity weakness, change in bowel or bladder continence.

## 2021-01-18 NOTE — Assessment & Plan Note (Signed)
Reviewed pathology report which is benign cervical polyp.  I am not surprised she still having a little bit of spotting as the entire stalk did not seem to come with the polyp.  I would like to see her back in 1 month.  If she is having any increase in symptoms or if she has continued bleeding, would consider further evaluation with endometrial biopsy but I doubt that will be necessary.  Should she have increasing symptoms, she will let me know in the interim.

## 2021-01-18 NOTE — Progress Notes (Signed)
° ° °  CHIEF COMPLAINT / HPI: Patient here for follow-up of vaginal bleeding.  At last office visit we did a polypectomy.  Since then she is had steady decrease in the amount of bleeding but she still having some spotting.  She had some cramping on the day of procedure but not since.  #2.  Back pain: Her back pain has been increasing over the last couple of months.  Midday she starts to have a lot of pain and wonders if she could change her pain medicine dosing so that she had a midday dose.  She does not want to pursue surgery if she can avoid it.   PERTINENT  PMH / PSH: I have reviewed the patients medications, allergies, past medical and surgical history, smoking status and updated in the EMR as appropriate.   OBJECTIVE:  BP (!) 141/59    Pulse (!) 54    Ht 5\' 9"  (1.753 m)    Wt 140 lb 9.6 oz (63.8 kg)    SpO2 99%    BMI 20.76 kg/m  GENERAL: Well-developed female no acute distress  ASSESSMENT / PLAN:   Spinal stenosis, lumbar region, with neurogenic claudication I agree to increase in her pain medicine.  Given her spinal stenosis, there is not an ideal surgery for that.  She has done really well with relatively low-dose opioid management.  We will increase that and I will follow her up in 1 month.  We again discussed red flags such as severe change in symptoms, lower extremity weakness, change in bowel or bladder continence.  Postmenopausal vaginal bleeding Reviewed pathology report which is benign cervical polyp.  I am not surprised she still having a little bit of spotting as the entire stalk did not seem to come with the polyp.  I would like to see her back in 1 month.  If she is having any increase in symptoms or if she has continued bleeding, would consider further evaluation with endometrial biopsy but I doubt that will be necessary.  Should she have increasing symptoms, she will let me know in the interim.   Dorcas Mcmurray MD

## 2021-02-05 ENCOUNTER — Other Ambulatory Visit: Payer: Self-pay

## 2021-02-05 MED ORDER — ATORVASTATIN CALCIUM 40 MG PO TABS: ORAL_TABLET | ORAL | 2 refills | Status: DC

## 2021-02-20 ENCOUNTER — Encounter: Payer: Self-pay | Admitting: Family Medicine

## 2021-02-20 ENCOUNTER — Ambulatory Visit (INDEPENDENT_AMBULATORY_CARE_PROVIDER_SITE_OTHER): Payer: Medicare Other | Admitting: Family Medicine

## 2021-02-20 ENCOUNTER — Other Ambulatory Visit: Payer: Self-pay

## 2021-02-20 VITALS — BP 126/54 | HR 48 | Ht 69.0 in | Wt 137.8 lb

## 2021-02-20 DIAGNOSIS — N950 Postmenopausal bleeding: Secondary | ICD-10-CM

## 2021-02-20 DIAGNOSIS — M48062 Spinal stenosis, lumbar region with neurogenic claudication: Secondary | ICD-10-CM | POA: Diagnosis not present

## 2021-02-20 NOTE — Assessment & Plan Note (Signed)
Slight improvement with increased dose of Tylenol 3.  She does not think the gabapentin is helping at all.  Discussed potentially cutting that in half or even stopping it for a day or 2 to see if it really was adding some benefit.  She is not sure that she wants to do that right now as she has these other issues going on with the gynecology referral, I will see her back in 6 weeks and we will rediscuss.  I suspect the gabapentin is offering some baseline pain relief.  Additionally, her MRI is about 71 years old and it might be time to repeat that.  I did broach the topic of neurosurgical evaluation she was very hesitant to consider any type of surgery.  She has had some type of injections in her back before and said they did not help much.  Potentially we could revisit and see what injection she actually had.

## 2021-02-20 NOTE — Patient Instructions (Signed)
I am sending in a referral to Gynecology. They should call you in next week. If you have not heard from them ion a week, please call my office and let me know.  You can try stopping the gabapentin for a few days if you wish.  Let me see you in six weeks

## 2021-02-20 NOTE — Progress Notes (Signed)
° ° °  CHIEF COMPLAINT / HPI: #1.  Vaginal bleeding: Continues to have small amount of vaginal bleeding.  Less than she was having before the polypectomy but still present, pretty much daily.  No pain. 2.  Questions about her medication for her back pain.  She does not think the gabapentin does much.  The increased dose of Tylenol 3 has been helpful.   PERTINENT  PMH / PSH: I have reviewed the patients medications, allergies, past medical and surgical history, smoking status and updated in the EMR as appropriate.   OBJECTIVE:  BP (!) 126/54    Pulse (!) 48    Ht 5\' 9"  (1.753 m)    Wt 137 lb 12.8 oz (62.5 kg)    SpO2 99%    BMI 20.35 kg/m  GENERAL: Well-developed female no acute distress PSYCH: AxOx4. Good eye contact.. No psychomotor retardation or agitation. Appropriate speech fluency and content. Asks and answers questions appropriately. Mood is congruent.   ASSESSMENT / PLAN:   Postmenopausal vaginal bleeding She is still having vaginal bleeding.  Unclear if this is related to retained portion of cervical polyp, new polyp, other pathology.  Has not had pelvic ultrasound or endometrial biopsy.  We will go ahead and refer her to gynecology for further evaluation and management.  Spinal stenosis, lumbar region, with neurogenic claudication Slight improvement with increased dose of Tylenol 3.  She does not think the gabapentin is helping at all.  Discussed potentially cutting that in half or even stopping it for a day or 2 to see if it really was adding some benefit.  She is not sure that she wants to do that right now as she has these other issues going on with the gynecology referral, I will see her back in 6 weeks and we will rediscuss.  I suspect the gabapentin is offering some baseline pain relief.  Additionally, her MRI is about 71 years old and it might be time to repeat that.  I did broach the topic of neurosurgical evaluation she was very hesitant to consider any type of surgery.  She  has had some type of injections in her back before and said they did not help much.  Potentially we could revisit and see what injection she actually had.   Dorcas Mcmurray MD

## 2021-02-20 NOTE — Assessment & Plan Note (Signed)
She is still having vaginal bleeding.  Unclear if this is related to retained portion of cervical polyp, new polyp, other pathology.  Has not had pelvic ultrasound or endometrial biopsy.  We will go ahead and refer her to gynecology for further evaluation and management.

## 2021-02-21 ENCOUNTER — Telehealth: Payer: Self-pay

## 2021-02-21 NOTE — Telephone Encounter (Signed)
Patient calls nurse line in regards to GYN referral. Patient reports she has not had any AUB since Tuesday. Patient reports she did not mention this to provider at [place] office visit. Patient feels she does not need to see GYN at this time.   I spoke with Dr. Nori Riis and will continue plan with GYN referral.   The patient has been updated.

## 2021-03-01 ENCOUNTER — Encounter: Payer: Self-pay | Admitting: Obstetrics and Gynecology

## 2021-03-01 ENCOUNTER — Ambulatory Visit (INDEPENDENT_AMBULATORY_CARE_PROVIDER_SITE_OTHER): Payer: Medicare Other | Admitting: Obstetrics and Gynecology

## 2021-03-01 ENCOUNTER — Other Ambulatory Visit (HOSPITAL_COMMUNITY)
Admission: RE | Admit: 2021-03-01 | Discharge: 2021-03-01 | Disposition: A | Discharge status: Home / Self Care | Payer: Medicare Other | Source: Ambulatory Visit | Attending: Obstetrics and Gynecology | Admitting: Obstetrics and Gynecology

## 2021-03-01 ENCOUNTER — Other Ambulatory Visit: Payer: Self-pay

## 2021-03-01 VITALS — BP 124/84 | HR 78 | Resp 16 | Ht 67.25 in | Wt 138.0 lb

## 2021-03-01 DIAGNOSIS — Z124 Encounter for screening for malignant neoplasm of cervix: Secondary | ICD-10-CM

## 2021-03-01 DIAGNOSIS — N950 Postmenopausal bleeding: Secondary | ICD-10-CM

## 2021-03-01 DIAGNOSIS — Z1151 Encounter for screening for human papillomavirus (HPV): Secondary | ICD-10-CM | POA: Insufficient documentation

## 2021-03-01 NOTE — Progress Notes (Signed)
GYNECOLOGY  VISIT   HPI: 71 y.o.   Single  African American  female   Kimberly Guerrero with No LMP recorded. Patient is postmenopausal.   here for postmenopausal bleeding.   Seen in ER on 01/08/21 for postmenopausal bleeding for 4 days duration.  Noted to have a large cervical polyp.  She saw her PCP, [facility], in follow up on 01/10/22. She had a benign cervical polyp removed.   She was seen again with her PCP on 02/20/21 with continued bleeding, and referral made to be seen here.  States the bleeding resolved,and patient wonders if she really needs to be here today.  LMP was 20 years ago.  No HRT.   Not sexually active for years.   GYNECOLOGIC HISTORY: No LMP recorded. Patient is postmenopausal. Contraception:  BTL Menopausal hormone therapy:  none Last mammogram:  12-28-20 category b density birads 1:neg Last pap smear:   04-26-15 neg HPV HR neg        OB History     Gravida  1   Para  1   Term  1   Preterm      AB      Living  1      SAB      IAB      Ectopic      Multiple      Live Births                 Patient Active Problem List   Diagnosis Date Noted   Postmenopausal vaginal bleeding 01/13/2021   Bradycardia 01/09/2020   Essential hypertension 01/09/2020   Heart murmur previously undiagnosed 01/09/2020   Spinal stenosis, lumbar region, with neurogenic claudication 08/12/2019   Lumbar radiculopathy 08/12/2019   Drug-induced constipation 08/12/2019   Trigger finger 07/01/2019   Chronic right SI joint pain 06/05/2017   Osteoarthritis of facet joint of lumbar spine 06/05/2017   Prediabetes 05/27/2016   Hyperlipemia 05/27/2016   Osteopenia 05/27/2016   Allergic rhinitis 11/03/2014   Osteoarthritis 06/06/2011   Back pain 07/02/2006   Insomnia 07/02/2006    Past Medical History:  Diagnosis Date   Back pain    Chronic back pain    Bradycardia    Chronic fatigue    Elevated cholesterol    Heart murmur    Hypertension     Osteoarthritis    Prediabetes     Past Surgical History:  Procedure Laterality Date   TUBAL LIGATION      Current Outpatient Medications  Medication Sig Dispense Refill   Acetaminophen-Codeine 300-30 MG tablet Take two tablets by mouth every 8 hours as needed for back pain 180 tablet 0   amLODipine (NORVASC) 5 MG tablet TAKE 1 TABLET(5 MG) BY MOUTH AT BEDTIME 90 tablet 3   atorvastatin (LIPITOR) 40 MG tablet TAKE 1 TABLET(40 MG) BY MOUTH DAILY Strength: 40 mg 90 tablet 2   calcium-vitamin D (OSCAL WITH D) 500-200 MG-UNIT tablet Take 2 tablets by mouth daily with breakfast. 60 tablet 1   cetirizine (ZYRTEC) 10 MG tablet Take 1 tablet (10 mg total) by mouth daily. 30 tablet 11   gabapentin (NEURONTIN) 100 MG capsule TAKE 4 CAPSULES BY MOUTH EVERY MORNING AND 4 CAPSULES AT LUNCH, THEN 4 CAPSULES AT BEDTIME 360 capsule 2   No current facility-administered medications for this visit.     ALLERGIES: Tramadol  No family history on file.  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    Patient still drives herself to run errands and medical apts.    Patient is close with her daughter Lenna Sciara.    Patient practices Yoga daily to help with her chronic pain.    Patient enjoys reading and cooking.    Social Determinants of Health   Financial  Resource Strain: Not on file  Food Insecurity: Not on file  Transportation Needs: Not on file  Physical Activity: Not on file  Stress: Not on file  Social Connections: Not on file  Intimate Partner Violence: Not on file    Review of Systems  See HPI.  PHYSICAL EXAMINATION:    BP 124/84    Pulse 78    Resp 16    Ht 5' 7.25" (1.708 m)    Wt 138 lb (62.6 kg)    BMI 21.45 kg/m     General appearance: alert, cooperative and appears stated age Head: Normocephalic, without obvious abnormality, atraumatic Neck: no adenopathy, supple, symmetrical, trachea midline and thyroid normal to inspection and palpation Lungs: clear to auscultation bilaterally Heart: regular rate and rhythm Abdomen: soft, non-tender, no masses,  no organomegaly Extremities: extremities normal, atraumatic, no cyanosis or edema Skin: Skin color, texture, turgor normal. No rashes or lesions No abnormal inguinal nodes palpated Neurologic: Grossly normal  Pelvic: External genitalia:  no lesions              Urethra:  normal appearing urethra with no masses, tenderness or lesions              Bartholins and Skenes: normal                 Vagina: normal appearing vagina with normal color and discharge, no lesions              Cervix: no lesions                Bimanual Exam:  Uterus:  normal size, contour, position, consistency, mobility, non-tender              Adnexa: no mass, fullness, tenderness              Rectal exam: yes.  Confirms.              Anus:  normal sphincter tone, no lesions  Chaperone was present for exam:  Joy, CMA  ASSESSMENT  Postmenopausal bleeding, recurrent. Status post cervical polypectomy.   Cervical cancer screening.   PLAN  Patient and I discussed postmenopausal bleeding and potential causes including but not limited to polyps, infection, and precancer and cancer of the reproductive organs.  Pap and high risk HPV collected today.  I recommend returning for a pelvic ultrasound and  possible endometrial biopsy.  Procedures and rationale explained to the  patient.  Questions invited and answered. ER visit and PCP office visits reviewed.   An After Visit Summary was printed and given to the patient.

## 2021-03-01 NOTE — Patient Instructions (Signed)
Postmenopausal Bleeding Postmenopausal bleeding is any bleeding that occurs after menopause. Menopause is a time in a woman's life when monthly periods stop. Any type of bleeding after menopause should be checked by your doctor. Treatment will depend on the cause. This kind of bleeding can be caused by: Taking hormones during menopause. Low or high amounts of female hormones in the body. This can cause the lining of the womb (uterus) to become too thin or too thick. Cancer. Growths in the womb that are not cancer. Follow these instructions at home:  Watch for any changes in your symptoms. Let your doctor know about them. Avoid using tampons and douches as told by your doctor. Change your pads regularly. Get regular pelvic exams. This includes Pap tests. Take iron pills as told by your doctor. Take over-the-counter and prescription medicines only as told by your doctor. Keep all follow-up visits. Contact a doctor if: You have new bleeding from the vagina after menopause. You have pain in your belly (abdomen). Get help right away if: You have a fever or chills. You have very bad pain with bleeding. You have clumps of blood (blood clots) coming from your vagina. You have a lot of bleeding, and: You use more than 1 pad an hour. This kind of bleeding has never happened before. You have headaches. You feel dizzy or you feel like you are going to pass out (faint). Summary Any type of bleeding after menopause should be checked by your doctor. Avoid using tampons or douches. Get regular pelvic exams. This includes Pap tests. Contact a doctor if you have new bleeding or pain in your belly. Watch for any changes in your symptoms. Let your doctor know about them. This information is not intended to replace advice given to you by your health care provider. Make sure you discuss any questions you have with your health care provider. Document Revised: 06/30/2019 Document Reviewed:  06/30/2019 Elsevier Patient Education  [place].

## 2021-03-07 LAB — CYTOLOGY - PAP
Comment: NEGATIVE
Diagnosis: NEGATIVE
Diagnosis: REACTIVE
High risk HPV: NEGATIVE

## 2021-03-13 DIAGNOSIS — H52221 Regular astigmatism, right eye: Secondary | ICD-10-CM | POA: Diagnosis not present

## 2021-03-13 DIAGNOSIS — H2513 Age-related nuclear cataract, bilateral: Secondary | ICD-10-CM | POA: Diagnosis not present

## 2021-03-13 DIAGNOSIS — H43811 Vitreous degeneration, right eye: Secondary | ICD-10-CM | POA: Diagnosis not present

## 2021-03-15 ENCOUNTER — Other Ambulatory Visit: Payer: Self-pay | Admitting: Family Medicine

## 2021-03-15 ENCOUNTER — Ambulatory Visit (INDEPENDENT_AMBULATORY_CARE_PROVIDER_SITE_OTHER): Payer: Medicare Other | Admitting: Family Medicine

## 2021-03-15 ENCOUNTER — Encounter: Payer: Self-pay | Admitting: Family Medicine

## 2021-03-15 VITALS — BP 121/67 | Ht 68.0 in | Wt 137.0 lb

## 2021-03-15 DIAGNOSIS — M5416 Radiculopathy, lumbar region: Secondary | ICD-10-CM

## 2021-03-15 DIAGNOSIS — M48062 Spinal stenosis, lumbar region with neurogenic claudication: Secondary | ICD-10-CM | POA: Diagnosis not present

## 2021-03-15 NOTE — Patient Instructions (Signed)
Attached is some information about cataracts.

## 2021-03-16 ENCOUNTER — Other Ambulatory Visit: Payer: Self-pay | Admitting: Family Medicine

## 2021-03-18 ENCOUNTER — Telehealth: Payer: Self-pay

## 2021-03-18 MED ORDER — ACETAMINOPHEN-CODEINE 300-30 MG PO TABS: ORAL_TABLET | ORAL | 0 refills | Status: DC

## 2021-03-18 NOTE — Assessment & Plan Note (Signed)
Pain seems to be increasing.  We will continue current pain medicine regimen and set her up for epidural steroid injections.  Follow-up after that.

## 2021-03-18 NOTE — Telephone Encounter (Signed)
-----   Message from Kimberly Guerrero sent at 03/18/2021  8:30 AM EST ----- Regarding: refill request Acetaminophen-Codeine 300-30 MG tablet

## 2021-03-18 NOTE — Progress Notes (Signed)
°  Kimberly Guerrero - 71 y.o. female MRN [id number]  Date of birth: October 01, 1950    SUBJECTIVE:      Chief Complaint:/ HPI:  Follow-up increased her dosage of her pain medicine that has helped a little bit.  She still suffering quite a bit throughout most of the day.  Has previously received some epidural steroid injections and would like to try those again. 2.  Saw the gynecologist regarding her vaginal bleeding.  She has had no more bleeding since I saw her last.  She has follow-up with OB/GYN.    OBJECTIVE: BP 121/67    Ht 5\' 8"  (1.727 m)    Wt 137 lb (62.1 kg)    BMI 20.83 kg/m   Physical Exam:  Vital signs are reviewed. GENERAL: Well-developed female no acute distress BACK: Some mild muscle spasm bilaterally.  Slight stooped at the hips during gait which is not antalgic.  Hip flexor strength 5 out of 5 bilaterally.  ASSESSMENT & PLAN:  See problem based charting & AVS for pt instructions. Spinal stenosis, lumbar region, with neurogenic claudication Pain seems to be increasing.  We will continue current pain medicine regimen and set her up for epidural steroid injections.  Follow-up after that.

## 2021-03-22 ENCOUNTER — Ambulatory Visit
Admission: RE | Admit: 2021-03-22 | Discharge: 2021-03-22 | Disposition: A | Discharge status: Home / Self Care | Payer: Medicare Other | Source: Ambulatory Visit | Attending: Family Medicine | Admitting: Family Medicine

## 2021-03-22 ENCOUNTER — Other Ambulatory Visit: Payer: Self-pay

## 2021-03-22 DIAGNOSIS — M5416 Radiculopathy, lumbar region: Secondary | ICD-10-CM

## 2021-03-22 DIAGNOSIS — M4727 Other spondylosis with radiculopathy, lumbosacral region: Secondary | ICD-10-CM | POA: Diagnosis not present

## 2021-03-22 MED ORDER — IOPAMIDOL (ISOVUE-M 200) INJECTION 41%
1.0000 mL | Freq: Once | INTRAMUSCULAR | Status: AC
  Administered 2021-03-22: 1 mL via EPIDURAL

## 2021-03-22 MED ORDER — METHYLPREDNISOLONE ACETATE 40 MG/ML INJ SUSP (RADIOLOG
80.0000 mg | Freq: Once | INTRAMUSCULAR | Status: AC
  Administered 2021-03-22: 80 mg via EPIDURAL

## 2021-03-22 NOTE — Discharge Instructions (Signed)

## 2021-04-01 NOTE — Progress Notes (Signed)
GYNECOLOGY  VISIT ?  ?HPI: ?71 y.o.   Single  African American  female   ?G1P1001 with No LMP recorded. Patient is postmenopausal.   ?here for pelvic ultrasound and possible EMB.  ? ?Patient has had recurrent postmenopausal bleeding.  ?She had a benign cervical polyp removed through her PCP office 01/10/21 ?Her bleeding persisted 6 weeks later and she was sent for GYN evaluation. ? ?No further bleeding.  ? ?GYNECOLOGIC HISTORY: ?No LMP recorded. Patient is postmenopausal. ?Contraception:  Tubal ?Menopausal hormone therapy:  none ?Last mammogram:  12-28-20 category b density birads 1:neg ?Last pap smear:   03-01-21 Neg:Neg HR HPV, 04-26-15 Neg:Neg HR HPV, 09-24-12 Neg ?       ?OB History   ? ? Gravida  ?1  ? Para  ?1  ? Term  ?1  ? Preterm  ?   ? AB  ?   ? Living  ?1  ?  ? ? SAB  ?   ? IAB  ?   ? Ectopic  ?   ? Multiple  ?   ? Live Births  ?   ?   ?  ?  ?    ? ?Patient Active Problem List  ? Diagnosis Date Noted  ? Postmenopausal vaginal bleeding 01/13/2021  ? Bradycardia 01/09/2020  ? Essential hypertension 01/09/2020  ? Heart murmur previously undiagnosed 01/09/2020  ? 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 Lumbar radiculopathy 08/12/2019  ? Drug-induced constipation 08/12/2019  ? Trigger finger 07/01/2019  ? Chronic right SI joint pain 06/05/2017  ? Osteoarthritis of facet joint of lumbar spine 06/05/2017  ? Prediabetes 05/27/2016  ? Hyperlipemia 05/27/2016  ? Osteopenia 05/27/2016  ? Allergic rhinitis 11/03/2014  ? Osteoarthritis 06/06/2011  ? Back pain 07/02/2006  ? Insomnia 07/02/2006  ? ? ?Past Medical History:  ?Diagnosis Date  ? Back pain   ? Chronic back pain   ? Bradycardia   ? Chronic fatigue   ? Elevated cholesterol   ? Heart murmur   ? Hypertension   ? Osteoarthritis   ? Prediabetes   ? ? ?Past Surgical History:  ?Procedure Laterality Date  ? TUBAL LIGATION    ? ? ?Current Outpatient Medications  ?Medication Sig Dispense Refill  ? Acetaminophen-Codeine 300-30 MG tablet  Take two tablets by mouth every 8 hours as needed for back pain 180 tablet 0  ? amLODipine (NORVASC) 5 MG tablet TAKE 1 TABLET(5 MG) BY MOUTH AT BEDTIME 90 tablet 3  ? atorvastatin (LIPITOR) 40 MG tablet TAKE 1 TABLET(40 MG) BY MOUTH DAILY ?Strength: 40 mg 90 tablet 2  ? calcium-vitamin D (OSCAL WITH D) 500-200 MG-UNIT tablet Take 2 tablets by mouth daily with breakfast. 60 tablet 1  ? cetirizine (ZYRTEC) 10 MG tablet Take 1 tablet (10 mg total) by mouth daily. 30 tablet 11  ? gabapentin (NEURONTIN) 100 MG capsule TAKE 4 CAPSULES BY MOUTH EVERY MORNING AND 4 CAPSULES AT LUNCH, THEN 4 CAPSULES AT BEDTIME 360 capsule 2  ? ?No current facility-administered medications for this visit.  ?  ? ?ALLERGIES: Tramadol ? ?History reviewed. No pertinent family history. ? ?Social History  ? ?Socioeconomic History  ? Marital status: Single  ?  Spouse name: Not on file  ? Number of children: Not on file  ? Years of education: 15  ? Highest education level: High school graduate  ?Occupational History  ? Occupation: Retired   ?  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  Smoking status: Never  ? Smokeless tobacco: Never  ?Vaping Use  ? Vaping Use: Never used  ?Substance and Sexual Activity  ? Alcohol use: No  ?  Alcohol/week: 0.0 standard drinks  ? Drug use: No  ? Sexual activity: Not Currently  ?  Birth control/protection: Post-menopausal, Surgical  ?  Comment: btl  ?Other Topics Concern  ? Not on file  ?Social History Narrative  ? Patient lives alone here in [place].   ? Patient still drives herself to run errands and medical apts.   ? Patient is close with her daughter Lenna Sciara.   ? Patient practices Yoga daily to help with her chronic pain.   ? Patient enjoys reading and cooking.   ? ?Social Determinants of Health  ? ?Financial Resource Strain: Not on file  ?Food Insecurity: Not on file  ?Transportation Needs: Not on file  ?Physical Activity: Not on file  ?Stress: Not on file  ?Social Connections: Not on file  ?Intimate  Partner Violence: Not on file  ? ? ?Review of Systems  ?All other systems reviewed and are negative. ? ?PHYSICAL EXAMINATION:   ? ?BP (!) 100/58   Pulse (!) 55   Ht 5' 7.25" (1.708 m)   Wt 138 lb (62.6 kg)   SpO2 98%   BMI 21.45 kg/m?     ?General appearance: alert, cooperative and appears stated age ?        ?Pelvic US  ?Uterus 6.89 x 6.38 x 2.86 cm.  ?Multiple fibroids noted:  intramural and subserous.  3.06 cm, 1.61 cm, 0.94 cm, 1.32 cm, 1.17 cm. 3.01 cm.  ?EMS 1.36 mm.  Has fluid in the endometrial cavity and a mass 6 x 3 mm noted.   ?Right ovary with 3.5 x 2.5 cm simple thin walled cyst.  ?Left ovary with a cm avascular cyst with calcification.  ? ?EMB ?Consent done.  ?Hibiclens prep.  ?Paracervical block 10 cc 1% lidocaine, lot [id number], exp 05/28/22. ?Tenaculum to anterior cervical lip.  ?Pipelle passed to 7 cm x 2.  ?Tissue to pathology.  ?No complications.  ?Minimal EBL.  ? ?Chaperone was present for exam:  Lovena Le, CMA ? ?ASSESSMENT ? ?Postmenopausal bleeding.  ?Status post cervical polypectomy. ?Endometrial mass.  ?Fibroids. ? ?PLAN ? ?Pelvic US images and report reviewed. ?Fibroids and endometrial mass discussed. ?Fu EMB.  ?Precautions given.  ?I mentioned possible hysteroscopy with Myosure  ?  ?An After Visit Summary was printed and given to the patient. ? ?20 min  total time was spent for this patient encounter, including preparation, face-to-face counseling with the patient, coordination of care, and documentation of the encounter. ? ? ?

## 2021-04-02 ENCOUNTER — Ambulatory Visit (INDEPENDENT_AMBULATORY_CARE_PROVIDER_SITE_OTHER): Payer: Medicare Other | Admitting: Obstetrics and Gynecology

## 2021-04-02 ENCOUNTER — Encounter: Payer: Self-pay | Admitting: Obstetrics and Gynecology

## 2021-04-02 ENCOUNTER — Ambulatory Visit (INDEPENDENT_AMBULATORY_CARE_PROVIDER_SITE_OTHER): Payer: Medicare Other

## 2021-04-02 ENCOUNTER — Other Ambulatory Visit: Payer: Self-pay

## 2021-04-02 ENCOUNTER — Other Ambulatory Visit (HOSPITAL_COMMUNITY)
Admission: RE | Admit: 2021-04-02 | Discharge: 2021-04-02 | Disposition: A | Discharge status: Home / Self Care | Payer: Medicare Other | Source: Ambulatory Visit | Attending: Obstetrics and Gynecology | Admitting: Obstetrics and Gynecology

## 2021-04-02 VITALS — BP 100/58 | HR 55 | Ht 67.25 in | Wt 138.0 lb

## 2021-04-02 DIAGNOSIS — N950 Postmenopausal bleeding: Secondary | ICD-10-CM

## 2021-04-02 DIAGNOSIS — N9489 Other specified conditions associated with female genital organs and menstrual cycle: Secondary | ICD-10-CM | POA: Insufficient documentation

## 2021-04-02 NOTE — Patient Instructions (Signed)

## 2021-04-05 ENCOUNTER — Other Ambulatory Visit: Payer: Self-pay | Admitting: *Deleted

## 2021-04-05 DIAGNOSIS — N879 Dysplasia of cervix uteri, unspecified: Secondary | ICD-10-CM

## 2021-04-05 LAB — SURGICAL PATHOLOGY

## 2021-04-07 ENCOUNTER — Other Ambulatory Visit: Payer: Self-pay | Admitting: Family Medicine

## 2021-04-17 ENCOUNTER — Encounter: Payer: Self-pay | Admitting: Family Medicine

## 2021-04-17 ENCOUNTER — Ambulatory Visit (INDEPENDENT_AMBULATORY_CARE_PROVIDER_SITE_OTHER): Payer: Medicare Other | Admitting: Family Medicine

## 2021-04-17 ENCOUNTER — Other Ambulatory Visit: Payer: Self-pay

## 2021-04-17 VITALS — BP 104/58 | HR 51 | Ht 67.0 in | Wt 133.4 lb

## 2021-04-17 DIAGNOSIS — Z1211 Encounter for screening for malignant neoplasm of colon: Secondary | ICD-10-CM

## 2021-04-17 DIAGNOSIS — E7800 Pure hypercholesterolemia, unspecified: Secondary | ICD-10-CM | POA: Diagnosis not present

## 2021-04-17 DIAGNOSIS — Z0000 Encounter for general adult medical examination without abnormal findings: Secondary | ICD-10-CM

## 2021-04-17 DIAGNOSIS — N950 Postmenopausal bleeding: Secondary | ICD-10-CM

## 2021-04-17 DIAGNOSIS — I10 Essential (primary) hypertension: Secondary | ICD-10-CM | POA: Diagnosis not present

## 2021-04-17 DIAGNOSIS — R7303 Prediabetes: Secondary | ICD-10-CM

## 2021-04-17 LAB — POCT GLYCOSYLATED HEMOGLOBIN (HGB A1C): HbA1c, POC (controlled diabetic range): 6.1 % (ref 0.0–7.0)

## 2021-04-17 MED ORDER — AMLODIPINE BESYLATE 5 MG PO TABS: 2.5000 mg | ORAL_TABLET | Freq: Every day | ORAL | 3 refills | Status: DC

## 2021-04-17 NOTE — Patient Instructions (Addendum)
Thank you for coming to see me today. It was a pleasure. Today we talked about:  ? ?Recommend Shingles vaccine.  Can get this at your local pharmacy. ? ?We will get some labs today.  If they are abnormal or we need to do something about them, I will call you.  If they are normal, I will send you a message on MyChart (if it is active) or a letter in the mail.  If you don't hear from [place] in 2 weeks, please call the office at the number below.  ? ?Decrease Amlodipine to 2.5 mg daily.  Take half of the tablet you have now.  If your blood pressure goes above 130 and continues to stay higher than this restart the Amlodipine 5 mg daily.   ? ?Please follow-up with PCP in 2 weeks ? ?If you have any questions or concerns, please do not hesitate to call the office at [phone]. ? ?Best,  ? ?Carollee Leitz, MD   ? ? ?

## 2021-04-17 NOTE — Progress Notes (Signed)
? ? ?  SUBJECTIVE:  ? ?CHIEF COMPLAINT / HPI: annual physical ? ?No acute concerns today ? ?HTN ?Compliant with antihypertensive.  Denies any visual changes, headaches, dizziness, chest pain, shortness of breath or lower extremity edema ? ?PERTINENT  PMH / PSH:  ?HTN ?HLD ?PreDM ?Post Menopausal bleeding ? ? ?OBJECTIVE:  ? ?BP (!) 104/58   Pulse (!) 51   Ht '5\' 7"'$  (1.702 m)   Wt 133 lb 6 oz (60.5 kg)   SpO2 99%   BMI 20.89 kg/m?   ? ?General: Alert, no acute distress ?Cardio: Normal S1 and S2, RRR, no r/m/g ?Pulm: CTAB, normal work of breathing ?Abdomen: Bowel sounds normal. Abdomen soft and non-tender.  ?Extremities: No peripheral edema.  ?Neuro: Cranial nerves grossly intact ? ? ?ASSESSMENT/PLAN:  ? ?Essential hypertension ?Low diastolic pressures last couple of visits.  Asymptomatic ?Bmet/CBC today ?Decrease Amlodipine from 5 mg to 2.5 mg daily ?Check BP at home and if begins to rise > 130 consistently can go back to 5 mg daily. ?Strict return precautions provided ?Follow up in 1 week for BP check ? ?Health care maintenance ?FOBT today ?Recommend Shingles vaccine ?Follow up in 1 year for continued HCM ? ? ?Hyperlipemia ?Repeat Lipid panel ?Continue statin ? ?Prediabetes ?A1c today ?  ? ? ?Carollee Leitz, MD ?[place]  ?

## 2021-04-18 LAB — LIPID PANEL
Chol/HDL Ratio: 1.6 ratio (ref 0.0–4.4)
Cholesterol, Total: 149 mg/dL (ref 100–199)
HDL: 94 mg/dL (ref >39)
LDL Chol Calc (NIH): 38 mg/dL (ref 0–99)
Triglycerides: 92 mg/dL (ref 0–149)
VLDL Cholesterol Cal: 17 mg/dL (ref 5–40)

## 2021-04-18 LAB — CBC
Hematocrit: 38.4 % (ref 34.0–46.6)
Hemoglobin: 12.5 g/dL (ref 11.1–15.9)
MCH: 28.1 pg (ref 26.6–33.0)
MCHC: 32.6 g/dL (ref 31.5–35.7)
MCV: 86 fL (ref 79–97)
Platelets: 152 10*3/uL (ref 150–450)
RBC: 4.45 x10E6/uL (ref 3.77–5.28)
RDW: 12.6 % (ref 11.7–15.4)
WBC: 6.5 10*3/uL (ref 3.4–10.8)

## 2021-04-18 LAB — BASIC METABOLIC PANEL
BUN/Creatinine Ratio: 30 — ABNORMAL HIGH (ref 12–28)
BUN: 27 mg/dL (ref 8–27)
CO2: 26 mmol/L (ref 20–29)
Calcium: 10.5 mg/dL — ABNORMAL HIGH (ref 8.7–10.3)
Chloride: 106 mmol/L (ref 96–106)
Creatinine, Ser: 0.9 mg/dL (ref 0.57–1.00)
Glucose: 151 mg/dL — ABNORMAL HIGH (ref 70–99)
Potassium: 5 mmol/L (ref 3.5–5.2)
Sodium: 145 mmol/L — ABNORMAL HIGH (ref 134–144)
eGFR: 69 mL/min/{1.73_m2} (ref >59)

## 2021-04-18 NOTE — Progress Notes (Signed)
?  Subjective:  ?  ? Patient ID: Kimberly Guerrero, female   DOB: 18-Mar-1950, 71 y.o.   MRN: [id number] ? ?HPI ?Patient here today for colposcopy due to abnormal cells, LSIL, seen on pathology report from endometrial biopsy done for postmenopausal bleeding.  ?She also has a benign polyp also noted on the endometrial biopsy specimen.  ? ? ?Review of Systems  ?All other systems reviewed and are negative. ?LMP: PMP ?Contraception: PMP ? ? ?   ?Objective:  ? Physical Exam ? ?Colposcopy. ?Consent obtained. ?3% vinegar used.  ?Atrophy noted of the vagina and the cervix. ?Hibiclens prep. ?Os finder used.  ?ECC and random biopsy at 6:00.  ?Monsel's placed.  ?Minimal EBL.  ?No complications.  ?   ?Assessment:  ?   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  ?Endometrial polyp. ?   ?Plan:  ?   ? ?FU biopsy result.  ?Fu in 10 - 14 days for results and planning of care. ?   ?

## 2021-04-21 ENCOUNTER — Encounter: Payer: Self-pay | Admitting: Family Medicine

## 2021-04-21 DIAGNOSIS — Z0000 Encounter for general adult medical examination without abnormal findings: Secondary | ICD-10-CM | POA: Insufficient documentation

## 2021-04-21 NOTE — Assessment & Plan Note (Signed)
Repeat Lipid panel ?Continue statin ?

## 2021-04-21 NOTE — Assessment & Plan Note (Addendum)
Low diastolic pressures last couple of visits.  Asymptomatic ?Bmet/CBC today ?Decrease Amlodipine from 5 mg to 2.5 mg daily ?Check BP at home and if begins to rise > 130 consistently can go back to 5 mg daily. ?Strict return precautions provided ?Follow up in 1 week for BP check ?

## 2021-04-21 NOTE — Assessment & Plan Note (Addendum)
FOBT today ?Recommend Shingles vaccine ?Follow up in 1 year for continued HCM ? ?

## 2021-04-21 NOTE — Assessment & Plan Note (Signed)
A1c today.  

## 2021-04-22 ENCOUNTER — Encounter: Payer: Self-pay | Admitting: Obstetrics and Gynecology

## 2021-04-22 ENCOUNTER — Ambulatory Visit: Payer: Medicare Other

## 2021-04-22 ENCOUNTER — Ambulatory Visit (INDEPENDENT_AMBULATORY_CARE_PROVIDER_SITE_OTHER): Payer: Medicare Other | Admitting: Obstetrics and Gynecology

## 2021-04-22 ENCOUNTER — Ambulatory Visit: Payer: Medicare Other | Admitting: Obstetrics and Gynecology

## 2021-04-22 ENCOUNTER — Other Ambulatory Visit (HOSPITAL_COMMUNITY)
Admission: RE | Admit: 2021-04-22 | Discharge: 2021-04-22 | Disposition: A | Discharge status: Home / Self Care | Payer: Medicare Other | Source: Ambulatory Visit | Attending: Obstetrics and Gynecology | Admitting: Obstetrics and Gynecology

## 2021-04-22 ENCOUNTER — Other Ambulatory Visit: Payer: Self-pay

## 2021-04-22 DIAGNOSIS — N879 Dysplasia of cervix uteri, unspecified: Secondary | ICD-10-CM | POA: Insufficient documentation

## 2021-04-22 DIAGNOSIS — N859 Noninflammatory disorder of uterus, unspecified: Secondary | ICD-10-CM | POA: Diagnosis not present

## 2021-04-22 DIAGNOSIS — N952 Postmenopausal atrophic vaginitis: Secondary | ICD-10-CM | POA: Diagnosis not present

## 2021-04-22 NOTE — Patient Instructions (Signed)
Colposcopy, Care After The following information offers guidance on how to care for yourself after your procedure. Your doctor may also give you more specific instructions. If you have problems or questions, contact your doctor. What can I expect after the procedure? If you did not have a sample of your tissue taken out (did not have a biopsy), you may only have some spotting of blood for a few days. You can go back to your normal activities. If you had a sample of your tissue taken out, it is common to have: Soreness and mild pain. These may last for a few days. Mild bleeding or fluid (discharge) coming from your vagina. The fluid will look dark and grainy. You may have this for a few days. The fluid may be caused by a liquid that was used during your procedure. You may need to wear a sanitary pad. Spotting of blood for at least 48 hours after the procedure. Follow these instructions at home: Medicines Take over-the-counter and prescription medicines only as told by your doctor. Ask your doctor what over-the-counter pain medicines and prescription medicines you can start taking again. This is very important if you take blood thinners. Activity For at least 3 days, or for as long as told by your doctor, avoid: Douching. Using tampons. Having sex. Return to your normal activities as told by your doctor. Ask your doctor what activities are safe for you. General instructions Ask your doctor if you may take baths, swim, or use a hot tub. You may take showers. If you use birth control (contraception), keep using it. Keep all follow-up visits. Contact a doctor if: You have a fever or chills. You faint or feel light-headed. Get help right away if: You bleed a lot from your vagina. A lot of bleeding means that the bleeding soaks through a pad in less than 1 hour. You have clumps of blood (blood clots) coming from your vagina. You have signs that could mean you have an infection. This may be fluid  coming from your vagina that is: Different than normal. Yellow. Bad-smelling. You have very bad pain or cramps in your lower belly that do not get better with medicine. Summary If you did not have a sample of your tissue taken out, you may only have some spotting of blood for a few days. You can go back to your normal activities. If you had a sample of your tissue taken out, it is common to have mild pain for a few days and spotting for 48 hours. Avoid douching, using tampons, and having sex for at least 3 days after the procedure or for as long as told. Get help right away if you have a lot of bleeding, very bad pain, or signs of infection. This information is not intended to replace advice given to you by your health care provider. Make sure you discuss any questions you have with your health care provider. Document Revised: 06/10/2020 Document Reviewed: 06/10/2020 Elsevier Patient Education  2022 Elsevier Inc.  

## 2021-04-24 ENCOUNTER — Telehealth: Payer: Self-pay | Admitting: *Deleted

## 2021-04-24 LAB — SURGICAL PATHOLOGY

## 2021-04-24 NOTE — Telephone Encounter (Signed)
Patient returned call, I explained to her the situation with the expired IFOBT and asked if she could come pick up another kit. I personally verified the expiration date and placed the kit at the front desk for the patient to pick up. Jaymes Graff Dmiya Malphrus ? ?

## 2021-04-24 NOTE — Telephone Encounter (Signed)
Left message for patient to return call. She was given an expired IFOBT by the [employer]. This was discovered when she brought the sample back. This will have to be re-collected. I have a new collection kit she may come pick up at her convenience. Jaymes Graff Dakiyah Heinke ? ?

## 2021-04-25 ENCOUNTER — Other Ambulatory Visit: Payer: Self-pay | Admitting: Family Medicine

## 2021-04-25 DIAGNOSIS — Z1211 Encounter for screening for malignant neoplasm of colon: Secondary | ICD-10-CM | POA: Diagnosis not present

## 2021-04-26 ENCOUNTER — Telehealth: Payer: Self-pay | Admitting: Obstetrics and Gynecology

## 2021-04-26 NOTE — Telephone Encounter (Signed)
Please begin process of precert and scheduling of hysteroscopy with Myosure resection of endometrial polyp, dilation and curettage at [facility]  ? ?My patient has postmenopausal bleeding and diagnosis of a benign endometrial polyp.  ? ?Time needed is 45 min.  ? ?She has an appointment in the office with me on 05/02/21 to discuss her colposcopy results and discuss the details of her  surgery.   ? ?She knows to expect that she needs the endometrial polyp removed.  ? ? ?

## 2021-04-27 LAB — FECAL OCCULT BLOOD, IMMUNOCHEMICAL: Fecal Occult Bld: NEGATIVE

## 2021-04-30 NOTE — Progress Notes (Signed)
GYNECOLOGY  VISIT ?  ?HPI: ?71 y.o.   Single  African American  female   ?G1P1001 with No LMP recorded. Patient is postmenopausal.   ?here for  follow up colposcopy biopsy result and plan of care  ? ?Patient had postmenopausal bleeding. ?She had a large benign cervical polyp removed by her PCP, and she then presented for care due to ongoing bleeding.  ? ?Pelvic US done 04/02/21 showed: ?Uterus 6.89 x 6.38 x 2.86 cm.  ?Multiple fibroids noted:  intramural and subserous.  3.06 cm, 1.61 cm, 0.94 cm, 1.32 cm, 1.17 cm. 3.01 cm.  ?EMS 1.36 mm.  Had fluid in the endometrial cavity and a mass 6 x 3 mm noted.   ?Right ovary with 3.5 x 2.5 cm simple thin walled cyst.  ?Left ovary with a 1 cm avascular cyst with calcification.  ? ?Endometrial biopsy showing benign endometrial polyp and LGSIL also.  ? ?Her pap on 03/01/21 was normal and she had negative HR HPV.  ?Colposcopy 04/22/21 showed atrophy and her biopsies showed cervical biopsy at 6:00 with koilocytic atypia suggestive of LGSIL and ECC showing LGSIL and benign endocervical tissue.  ? ?GYNECOLOGIC HISTORY: ?No LMP recorded. Patient is postmenopausal. ?Contraception:  PMP ?Menopausal hormone therapy:  none ?Last mammogram:  12-28-20 normal ?Last pap smear:   03-01-21 normal HPV neg ?       ?OB History   ? ? Gravida  ?1  ? Para  ?1  ? Term  ?1  ? Preterm  ?   ? AB  ?   ? Living  ?1  ?  ? ? SAB  ?   ? IAB  ?   ? Ectopic  ?   ? Multiple  ?   ? Live Births  ?   ?   ?  ?  ?    ? ?Patient Active Problem List  ? Diagnosis Date Noted  ? Health care maintenance 04/21/2021  ? Postmenopausal vaginal bleeding 01/13/2021  ? Bradycardia 01/09/2020  ? Essential hypertension 01/09/2020  ? Heart murmur previously undiagnosed 01/09/2020  ? 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 Lumbar radiculopathy 08/12/2019  ? Drug-induced constipation 08/12/2019  ? Trigger finger 07/01/2019  ? Chronic right SI joint pain 06/05/2017  ? Osteoarthritis of facet joint of lumbar  spine 06/05/2017  ? Prediabetes 05/27/2016  ? Hyperlipemia 05/27/2016  ? Osteopenia 05/27/2016  ? Allergic rhinitis 11/03/2014  ? Osteoarthritis 06/06/2011  ? Back pain 07/02/2006  ? Insomnia 07/02/2006  ? ? ?Past Medical History:  ?Diagnosis Date  ? Back pain   ? Chronic back pain   ? Bradycardia   ? Chronic fatigue   ? Elevated cholesterol   ? Heart murmur   ? Hypertension   ? Osteoarthritis   ? Prediabetes   ? ? ?Past Surgical History:  ?Procedure Laterality Date  ? TUBAL LIGATION    ? ? ?Current Outpatient Medications  ?Medication Sig Dispense Refill  ? Acetaminophen-Codeine 300-30 MG tablet Take two tablets by mouth every 8 hours as needed for back pain 180 tablet 0  ? amLODipine (NORVASC) 5 MG tablet Take 0.5 tablets (2.5 mg total) by mouth daily. 90 tablet 3  ? atorvastatin (LIPITOR) 40 MG tablet TAKE 1 TABLET(40 MG) BY MOUTH DAILY ?Strength: 40 mg 90 tablet 2  ? calcium-vitamin D (OSCAL WITH D) 500-200 MG-UNIT tablet Take 2 tablets by mouth daily with breakfast. 60 tablet 1  ? cetirizine (ZYRTEC) 10 MG tablet Take 1 tablet (  10 mg total) by mouth daily. 30 tablet 11  ? gabapentin (NEURONTIN) 100 MG capsule TAKE 4 CAPSULES BY MOUTH EVERY MORNING, AT LUNCH, AND AT BEDTIME 360 capsule 2  ? ?No current facility-administered medications for this visit.  ?  ? ?ALLERGIES: Tramadol ? ?No family history on file. ? ?Social History  ? ?Socioeconomic History  ? Marital status: Single  ?  Spouse name: Not on file  ? Number of children: Not on file  ? Years of education: 28  ? Highest education level: High school graduate  ?Occupational History  ? Occupation: Retired   ?  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 Smoking status: Never  ? Smokeless tobacco: Never  ?Vaping Use  ? Vaping Use: Never used  ?Substance and Sexual Activity  ? Alcohol use: No  ?  Alcohol/week: 0.0 standard drinks  ? Drug use: No  ? Sexual activity: Not Currently  ?  Birth control/protection: Post-menopausal, Surgical  ?  Comment: btl  ?Other  Topics Concern  ? Not on file  ?Social History Narrative  ? Patient lives alone here in [place].   ? Patient still drives herself to run errands and medical apts.   ? Patient is close with her daughter Lenna Sciara.   ? Patient practices Yoga daily to help with her chronic pain.   ? Patient enjoys reading and cooking.   ? ?Social Determinants of Health  ? ?Financial Resource Strain: Not on file  ?Food Insecurity: Not on file  ?Transportation Needs: Not on file  ?Physical Activity: Not on file  ?Stress: Not on file  ?Social Connections: Not on file  ?Intimate Partner Violence: Not on file  ? ? ?Review of Systems See HPI.  ? ?PHYSICAL EXAMINATION:   ? ?BP 122/74 (BP Location: Right Arm, Patient Position: Sitting, Cuff Size: Normal)     ?General appearance: alert, cooperative and appears stated age ?Head: Normocephalic, without obvious abnormality, atraumatic ?Lungs: clear to auscultation bilaterally ?Heart: regular rate and rhythm ?Abdomen: soft, non-tender, no masses,  no organomegaly ?Extremities: extremities normal, atraumatic, no cyanosis or edema ?Neurologic: Grossly normal ? ?Pelvic:   deferred.  ? ?ASSESSMENT ? ?Postmenopausal bleeding.  ?Status post cervical polypectomy. ?Endometrial polyp. ?Fibroids. ?LGSIL of cervix.  ?Negative HR HPV. ?Simple right ovarian cyst.  ?Left ovarian calcification.  ?Prediabetes.  A1C 6.1 on 04/17/21. ? ?PLAN ? ?We discussed her colposcopic biopsies.  ?Will plan for pap and HR HPV testing in one year. ? ?Discussion of hysteroscopy with Myosure polypectomy, dilation and curettage.  ?Risks, benefits, and alternatives reviewed. ?Risks include but are not limited to bleeding, infection, damage to surrounding organs including uterine perforation requiring hospitalization and laparoscopy, pulmonary edema, reaction to anesthesia, DVT, PE, death, need for further treatment. ?Surgical expectations and recovery discussed.  ?Patient wishes to proceed. ?ACOG HO on hysteroscopy and dilation and  curettage.  ? ?An After Visit Summary was printed and given to the patient. ? ?38 min  total time was spent for this patient encounter, including preparation, face-to-face counseling with the patient, coordination of care, and documentation of the encounter. ? ?  ?

## 2021-04-30 NOTE — Telephone Encounter (Signed)
Left message to call Djeneba Barsch, RN at [facility], [phone], OPT 5.  

## 2021-04-30 NOTE — Telephone Encounter (Signed)
Spoke with patient. Reviewed surgery dates. Patient request to proceed with surgery on 05/27/21.  Advised patient I will forward to business office for return call. I will return call once surgery date and time confirmed. Patient verbalizes understanding and is agreeable.  ? ?Surgery request sent.  ? ?

## 2021-05-02 ENCOUNTER — Ambulatory Visit (INDEPENDENT_AMBULATORY_CARE_PROVIDER_SITE_OTHER): Payer: Medicare Other | Admitting: Obstetrics and Gynecology

## 2021-05-02 VITALS — BP 122/74

## 2021-05-02 DIAGNOSIS — N870 Mild cervical dysplasia: Secondary | ICD-10-CM | POA: Diagnosis not present

## 2021-05-02 DIAGNOSIS — N840 Polyp of corpus uteri: Secondary | ICD-10-CM | POA: Diagnosis not present

## 2021-05-02 DIAGNOSIS — N950 Postmenopausal bleeding: Secondary | ICD-10-CM

## 2021-05-02 NOTE — Telephone Encounter (Signed)
Spoke with patient while in office.  ?Surgery date request confirmed.  ?Advised surgery is scheduled for 05/27/21, [facility] [place].  ?Surgery instruction sheet and hospital brochure reviewed, printed copy provided to patient.  ?Patient verbalizes understanding and is agreeable.  ?Routing to [facility] for benefits.  ? ?

## 2021-05-06 NOTE — Patient Instructions (Incomplete)
Thank you for coming to see me today. It was a pleasure. Today we talked about:  ? ?*** ? ?Please follow-up with *** in *** ? ?If you have any questions or concerns, please do not hesitate to call the office at [phone]. ? ?Best,  ? ?Carollee Leitz, MD   ?

## 2021-05-06 NOTE — Progress Notes (Deleted)
    SUBJECTIVE:   CHIEF COMPLAINT / HPI:   ***  PERTINENT  PMH / PSH: ***  OBJECTIVE:   There were no vitals taken for this visit.   General: Alert, no acute distress Cardio: Normal S1 and S2, RRR, no r/m/g Pulm: CTAB, normal work of breathing Abdomen: Bowel sounds normal. Abdomen soft and non-tender.  Extremities: No peripheral edema.  Neuro: Cranial nerves grossly intact   ASSESSMENT/PLAN:   No problem-specific Assessment & Plan notes found for this encounter.     Marcelina Mclaurin, MD [facility]   

## 2021-05-07 ENCOUNTER — Ambulatory Visit (INDEPENDENT_AMBULATORY_CARE_PROVIDER_SITE_OTHER): Payer: Medicare Other | Admitting: Family Medicine

## 2021-05-07 ENCOUNTER — Encounter: Payer: Self-pay | Admitting: Family Medicine

## 2021-05-07 ENCOUNTER — Encounter: Payer: Self-pay | Admitting: Obstetrics and Gynecology

## 2021-05-07 ENCOUNTER — Ambulatory Visit: Payer: Medicare Other | Admitting: Family Medicine

## 2021-05-07 DIAGNOSIS — E7800 Pure hypercholesterolemia, unspecified: Secondary | ICD-10-CM | POA: Diagnosis not present

## 2021-05-07 DIAGNOSIS — I10 Essential (primary) hypertension: Secondary | ICD-10-CM

## 2021-05-07 MED ORDER — ATORVASTATIN CALCIUM 40 MG PO TABS
ORAL_TABLET | ORAL | 2 refills | Status: DC
Start: 2021-05-07 — End: 2021-05-07

## 2021-05-07 MED ORDER — HYDROCORTISONE 2.5 % EX OINT: TOPICAL_OINTMENT | Freq: Two times a day (BID) | CUTANEOUS | 0 refills | Status: DC

## 2021-05-07 MED ORDER — ATORVASTATIN CALCIUM 40 MG PO TABS: 40.0000 mg | ORAL_TABLET | Freq: Every day | ORAL | 0 refills | Status: DC

## 2021-05-07 NOTE — Patient Instructions (Signed)
It was nice seeing you today. ?Please, discontinue your BP medication altogether as your BP looks good. I sent in steroid cream for your face. See your PCP soon for BP monitoring. ?

## 2021-05-07 NOTE — Assessment & Plan Note (Signed)
Lipitor refilled.

## 2021-05-07 NOTE — Assessment & Plan Note (Addendum)
Her BP is soft on Norvasc 2.5 mg QD. ?Her BP goal is 120/70 to 140/90. ?Her BP remains soft during this visit after repeat checks by me. ?I advised her to d/c Norvasc altogether and see her PCP in 2 weeks for BP check and medication adjustment.  ?She agreed with the plan.  ?

## 2021-05-07 NOTE — Progress Notes (Signed)
? ? ?  SUBJECTIVE:  ? ?CHIEF COMPLAINT / HPI:  ? ?HTN/HLD: ?She is here for f/u. She is currently on Norvasc 2.5 mg QD. Home BP ranges from 301 - 601 systolic and 50 - 70 diastolic. She denies any concerns. She need a refill of her Lipitor.  ? ?Facial rash: ?She c/o a dry, scaly, hypopigmented rash above her eye brow. No other symptoms. Rash has been there for 1 week.  ? ?PERTINENT  PMH / PSH: PMHx reviewed. ? ?OBJECTIVE:  ? ?Vitals:  ? 05/07/21 1052 05/07/21 1100  ?BP: 117/62 (!) 108/54  ?Pulse: (!) 54   ?SpO2: 100%   ?Weight: 135 lb 12.8 oz (61.6 kg)   ?Height: 5' 7.5" (1.715 m)   ? ? ?Physical Exam ?Vitals and nursing note reviewed.  ?Cardiovascular:  ?   Rate and Rhythm: Normal rate and regular rhythm.  ?   Heart sounds: Normal heart sounds. No murmur heard. ?Pulmonary:  ?   Effort: Pulmonary effort is normal. No respiratory distress.  ?   Breath sounds: Normal breath sounds. No wheezing.  ?Abdominal:  ?   General: Abdomen is flat. Bowel sounds are normal. There is no distension.  ?Musculoskeletal:  ?   Right lower leg: No edema.  ?   Left lower leg: No edema.  ?Skin: ?   Comments: Dry, scaly, hypopigmented lesion above her eyebrows  ? ? ? ?ASSESSMENT/PLAN:  ? ?Essential hypertension ?Her BP is soft on Norvasc 2.5 mg QD. ?Her BP goal is 120/70 to 140/90. ?Her BP remains soft during this visit after repeat checks by me. ?I advised her to d/c Norvasc altogether and see her PCP in 2 weeks for BP check and medication adjustment.  ?She agreed with the plan.  ? ?Hyperlipemia ?Lipitor refilled.  ?  ?Seborrheic dermatitis. ?Topical hydrocortisone escribed. ?F/U in 2 weeks for reassessment. ?She agreed with the plan.  ? ?Andrena Mews, MD ?[place]  ? ?

## 2021-05-09 NOTE — Telephone Encounter (Signed)
Spoke with patient regarding surgery benefits. Patient acknowledges understanding of information presented. Patient is aware that benefits presented are professional benefits only. Patient is aware the hospital will call with facility benefits. See account note. ? ?Encounter closed. ?

## 2021-05-12 NOTE — H&P (Signed)
Office Visit ?05/02/2021 ?[facility] ?Nunzio Cobbs, MD ?Obstetrics and Gynecology Postmenopausal vaginal bleeding +2 more ?Dx Referred by Carollee Leitz, MD ?Reason for Visit  ? ?Additional Documentation ? ?Vitals:  BP 122/74 (BP Location: Right Arm, Patient Position: Sitting, Cuff Size: Normal)  ?Flowsheets:  MEWS Score, ?NEWS, ?Method of Visit ?  ?Encounter Info:  Billing Info, ?History, ?Allergies, ?Detailed Report ?  ? ?All Notes ? ? ? Progress Notes by Nunzio Cobbs, MD at 05/02/2021 10:00 AM ? ?Author: Nunzio Cobbs, MD Author Type: Physician Filed: 05/07/2021  7:26 PM  ?Note Status: Signed Cosign: Cosign Not Required Encounter Date: 05/02/2021  ?Editor: Nunzio Cobbs, MD (Physician)      ?Prior Versions: 1. Nelva Nay, CMA (Certified Medical Assistant) at 05/02/2021  9:35 AM - Sign when Signing Visit  ?  ?GYNECOLOGY  VISIT ?  ?HPI: ?71 y.o.   Single  African American  female   ?G1P1001 with No LMP recorded. Patient is postmenopausal.   ?here for  follow up colposcopy biopsy result and plan of care  ?  ?Patient had postmenopausal bleeding. ?She had a large benign cervical polyp removed by her PCP, and she then presented for care due to ongoing bleeding.  ?  ?Pelvic US done 04/02/21 showed: ?Uterus 6.89 x 6.38 x 2.86 cm.  ?Multiple fibroids noted:  intramural and subserous.  3.06 cm, 1.61 cm, 0.94 cm, 1.32 cm, 1.17 cm. 3.01 cm.  ?EMS 1.36 mm.  Had fluid in the endometrial cavity and a mass 6 x 3 mm noted.   ?Right ovary with 3.5 x 2.5 cm simple thin walled cyst.  ?Left ovary with a 1 cm avascular cyst with calcification.  ?  ?Endometrial biopsy showing benign endometrial polyp and LGSIL also.  ?  ?Her pap on 03/01/21 was normal and she had negative HR HPV.  ?Colposcopy 04/22/21 showed atrophy and her biopsies showed cervical biopsy at 6:00 with koilocytic atypia suggestive of LGSIL and ECC showing LGSIL and benign endocervical tissue.  ?   ?GYNECOLOGIC HISTORY: ?No LMP recorded. Patient is postmenopausal. ?Contraception:  PMP ?Menopausal hormone therapy:  none ?Last mammogram:  12-28-20 normal ?Last pap smear:   03-01-21 normal HPV neg ?       ?OB History   ?  ?  Gravida  ?1  ? Para  ?1  ? Term  ?1  ? Preterm  ?   ? AB  ?   ? Living  ?1  ?  ?  ?  SAB  ?   ? IAB  ?   ? Ectopic  ?   ? Multiple  ?   ? Live Births  ?   ?    ?  ?   ?    ?  ?    ?Patient Active Problem List  ?  Diagnosis Date Noted  ? Health care maintenance 04/21/2021  ? Postmenopausal vaginal bleeding 01/13/2021  ? Bradycardia 01/09/2020  ? Essential hypertension 01/09/2020  ? Heart murmur previously undiagnosed 01/09/2020  ? 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 Lumbar radiculopathy 08/12/2019  ? Drug-induced constipation 08/12/2019  ? Trigger finger 07/01/2019  ? Chronic right SI joint pain 06/05/2017  ? Osteoarthritis of facet joint of lumbar spine 06/05/2017  ? Prediabetes 05/27/2016  ? Hyperlipemia 05/27/2016  ? Osteopenia 05/27/2016  ? Allergic rhinitis 11/03/2014  ? Osteoarthritis 06/06/2011  ? Back pain 07/02/2006  ? Insomnia  07/02/2006  ?  ?  ?    ?Past Medical History:  ?Diagnosis Date  ? Back pain    ?  Chronic back pain   ? Bradycardia    ? Chronic fatigue    ? Elevated cholesterol    ? Heart murmur    ? Hypertension    ? Osteoarthritis    ? Prediabetes    ?  ?  ?     ?Past Surgical History:  ?Procedure Laterality Date  ? TUBAL LIGATION      ?  ?  ?      ?Current Outpatient Medications  ?Medication Sig Dispense Refill  ? Acetaminophen-Codeine 300-30 MG tablet Take two tablets by mouth every 8 hours as needed for back pain 180 tablet 0  ? amLODipine (NORVASC) 5 MG tablet Take 0.5 tablets (2.5 mg total) by mouth daily. 90 tablet 3  ? atorvastatin (LIPITOR) 40 MG tablet TAKE 1 TABLET(40 MG) BY MOUTH DAILY ?Strength: 40 mg 90 tablet 2  ? calcium-vitamin D (OSCAL WITH D) 500-200 MG-UNIT tablet Take 2 tablets by mouth daily with breakfast. 60  tablet 1  ? cetirizine (ZYRTEC) 10 MG tablet Take 1 tablet (10 mg total) by mouth daily. 30 tablet 11  ? gabapentin (NEURONTIN) 100 MG capsule TAKE 4 CAPSULES BY MOUTH EVERY MORNING, AT LUNCH, AND AT BEDTIME 360 capsule 2  ?  ?No current facility-administered medications for this visit.  ?  ?  ?ALLERGIES: Tramadol ?  ?No family history on file. ?  ?Social History  ?  ?     ?Socioeconomic History  ? Marital status: Single  ?    Spouse name: Not on file  ? Number of children: Not on file  ? Years of education: 62  ? Highest education level: High school graduate  ?Occupational History  ? Occupation: Retired   ?    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 Smoking status: Never  ? Smokeless tobacco: Never  ?Vaping Use  ? Vaping Use: Never used  ?Substance and Sexual Activity  ? Alcohol use: No  ?    Alcohol/week: 0.0 standard drinks  ? Drug use: No  ? Sexual activity: Not Currently  ?    Birth control/protection: Post-menopausal, Surgical  ?    Comment: btl  ?Other Topics Concern  ? Not on file  ?Social History Narrative  ?  Patient lives alone here in [place].   ?  Patient still drives herself to run errands and medical apts.   ?  Patient is close with her daughter Lenna Sciara.   ?  Patient practices Yoga daily to help with her chronic pain.   ?  Patient enjoys reading and cooking.   ?  ?Social Determinants of Health  ?  ?Financial Resource Strain: Not on file  ?Food Insecurity: Not on file  ?Transportation Needs: Not on file  ?Physical Activity: Not on file  ?Stress: Not on file  ?Social Connections: Not on file  ?Intimate Partner Violence: Not on file  ?  ?  ?Review of Systems See HPI.  ?  ?PHYSICAL EXAMINATION:   ?  ?BP 122/74 (BP Location: Right Arm, Patient Position: Sitting, Cuff Size: Normal)     ?General appearance: alert, cooperative and appears stated age ?Head: Normocephalic, without obvious abnormality, atraumatic ?Lungs: clear to auscultation bilaterally ?Heart: regular rate and rhythm ?Abdomen: soft,  non-tender, no masses,  no organomegaly ?Extremities: extremities normal, atraumatic, no cyanosis or edema ?Neurologic: Grossly normal ?  ?  Pelvic:   deferred.  ?  ?ASSESSMENT ?  ?Postmenopausal bleeding.  ?Status post cervical polypectomy. ?Endometrial polyp. ?Fibroids. ?LGSIL of cervix.  ?Negative HR HPV. ?Simple right ovarian cyst.  ?Left ovarian calcification.  ?Prediabetes.  A1C 6.1 on 04/17/21. ?  ?PLAN ?  ?We discussed her colposcopic biopsies.  ?Will plan for pap and HR HPV testing in one year. ?  ?Discussion of hysteroscopy with Myosure polypectomy, dilation and curettage.  ?Risks, benefits, and alternatives reviewed. ?Risks include but are not limited to bleeding, infection, damage to surrounding organs including uterine perforation requiring hospitalization and laparoscopy, pulmonary edema, reaction to anesthesia, DVT, PE, death, need for further treatment. ?Surgical expectations and recovery discussed.  ?Patient wishes to proceed. ?ACOG HO on hysteroscopy and dilation and curettage.  ?  ?An After Visit Summary was printed and given to the patient. ?  ?38 min  total time was spent for this patient encounter, including preparation, face-to-face counseling with the patient, coordination of care, and documentation of the encounter. ?  ?  ? ? ?

## 2021-05-14 ENCOUNTER — Ambulatory Visit (INDEPENDENT_AMBULATORY_CARE_PROVIDER_SITE_OTHER): Payer: Medicare Other

## 2021-05-14 VITALS — Ht 68.0 in | Wt 133.0 lb

## 2021-05-14 DIAGNOSIS — Z0000 Encounter for general adult medical examination without abnormal findings: Secondary | ICD-10-CM | POA: Diagnosis not present

## 2021-05-14 NOTE — Progress Notes (Addendum)
? ?Subjective:  ? Kimberly Guerrero is a 71 y.o. female who presents for Medicare Annual (Subsequent) preventive examination. ? ?Patient consented to have virtual visit and was identified by name and date of birth. ?Method of visit: Telephone ? ?Encounter participants: ?Patient: Kimberly Guerrero - located at Home ?Nurse/Provider: Dorna Bloom - located at [facility] ?Others (if applicable): NA ? ?Review of Systems: Defer to PCP. ? ?Cardiac Risk Factors include: advanced age (>70mn, >>35women) ? ?Objective:  ? ?Vitals: Ht '5\' 8"'$  (1.727 m)   Wt 133 lb (60.3 kg)   BMI 20.22 kg/m?   Body mass index is 20.22 kg/m?. ? ? ?  05/14/2021  ? 12:45 PM 05/07/2021  ? 10:52 AM 04/17/2021  ? 11:08 AM 10/09/2020  ?  8:42 AM 04/04/2020  ? 11:09 AM 08/02/2018  ?  9:50 AM 02/24/2018  ?  1:41 PM  ?Advanced Directives  ?Does Patient Have a Medical Advance Directive? No No No No No No No  ?Would patient like information on creating a medical advance directive? Yes (MAU/Ambulatory/Procedural Areas - Information given) No - Patient declined No - Patient declined No - Patient declined No - Patient declined Yes (MAU/Ambulatory/Procedural Areas - Information given) No - Patient declined  ? ?Tobacco ?Social History  ? ?Tobacco Use  ?Smoking Status Never  ? Passive exposure: Never  ?Smokeless Tobacco Never  ?   ?Clinical Intake: ? ?Pre-visit preparation completed: Yes ? ?Pain Score: 0-No pain ? ?How often do you need to have someone help you when you read instructions, pamphlets, or other written materials from your doctor or pharmacy?: 2 - Rarely ?What is the last grade level you completed in school?: high school ? ?Interpreter Needed?: No ? ?Past Medical History:  ?Diagnosis Date  ? Back pain   ? Chronic back pain   ? Bradycardia   ? Chronic fatigue   ? Elevated cholesterol   ? Heart murmur   ? Hypertension   ? Osteoarthritis   ? Prediabetes   ? ?Past Surgical History:  ?Procedure Laterality Date  ? TUBAL LIGATION    ? ?Family History  ?Problem Relation Age of  Onset  ? Breast cancer Sister   ? ?Social History  ? ?Socioeconomic History  ? Marital status: Divorced  ?  Spouse name: Not on file  ? Number of children: Not on file  ? Years of education: 142 ? Highest education level: High school graduate  ?Occupational History  ? Occupation: Retired   ?  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 Smoking status: Never  ?  Passive exposure: Never  ? Smokeless tobacco: Never  ?Vaping Use  ? Vaping Use: Never used  ?Substance and Sexual Activity  ? Alcohol use: No  ?  Alcohol/week: 0.0 standard drinks  ? Drug use: No  ? Sexual activity: Not Currently  ?  Birth control/protection: Post-menopausal, Surgical  ?  Comment: btl  ?Other Topics Concern  ? Not on file  ?Social History Narrative  ? Patient lives alone here in [place]   ? Patient still drives herself to run errands and medical apts.   ? Patient is close with her daughter MLenna Sciara   ? Aerobics 3-4 times per week for exercise.   ? Patient enjoys reading and cooking.   ? ?Social Determinants of Health  ? ?Financial Resource Strain: Low Risk   ? Difficulty of Paying Living Expenses: Not hard at all  ?Food Insecurity: No Food Insecurity  ? Worried  About Running Out of Food in the Last Year: Never true  ? Ran Out of Food in the Last Year: Never true  ?Transportation Needs: No Transportation Needs  ? Lack of Transportation (Medical): No  ? Lack of Transportation (Non-Medical): No  ?Physical Activity: Sufficiently Active  ? Days of Exercise per Week: 3 days  ? Minutes of Exercise per Session: 60 min  ?Stress: No Stress Concern Present  ? Feeling of Stress : Only a little  ?Social Connections: Moderately Integrated  ? Frequency of Communication with Friends and Family: More than three times a week  ? Frequency of Social Gatherings with Friends and Family: More than three times a week  ? Attends Religious Services: More than 4 times per year  ? Active Member of Clubs or Organizations: Yes  ? Attends Archivist  Meetings: More than 4 times per year  ? Marital Status: Divorced  ? ?Outpatient Encounter Medications as of 05/14/2021  ?Medication Sig  ? Acetaminophen-Codeine 300-30 MG tablet Take two tablets by mouth every 8 hours as needed for back pain  ? atorvastatin (LIPITOR) 40 MG tablet Take 1 tablet (40 mg total) by mouth daily. TAKE 1 TABLET(40 MG) BY MOUTH DAILY Strength: 40 mg  ? calcium-vitamin D (OSCAL WITH D) 500-200 MG-UNIT tablet Take 2 tablets by mouth daily with breakfast.  ? cetirizine (ZYRTEC) 10 MG tablet Take 1 tablet (10 mg total) by mouth daily.  ? gabapentin (NEURONTIN) 100 MG capsule TAKE 4 CAPSULES BY MOUTH EVERY MORNING, AT LUNCH, AND AT BEDTIME  ? hydrocortisone 2.5 % ointment Apply topically 2 (two) times daily.  ? ?No facility-administered encounter medications on file as of 05/14/2021.  ? ?Activities of Daily Living ? ?  05/14/2021  ? 12:45 PM  ?In your present state of health, do you have any difficulty performing the following activities:  ?Hearing? 0  ?Vision? 0  ?Difficulty concentrating or making decisions? 0  ?Walking or climbing stairs? 0  ?Dressing or bathing? 0  ?Doing errands, shopping? 0  ?Preparing Food and eating ? N  ?Using the Toilet? N  ?In the past six months, have you accidently leaked urine? N  ?Do you have problems with loss of bowel control? N  ?Managing your Medications? N  ?Managing your Finances? N  ?Housekeeping or managing your Housekeeping? N  ? ?Patient Care Team: ?Carollee Leitz, MD as PCP - General (Family Medicine) ?Jerline Pain, MD as PCP - Cardiology (Cardiology) ?Nunzio Cobbs, MD as Consulting Physician (Obstetrics and Gynecology) ?   ?Assessment:  ? This is a routine wellness examination for Kimberly Guerrero. ? ?Exercise Activities and Dietary recommendations ?Current Exercise Habits: Structured exercise class, Type of exercise: strength training/weights, Time (Minutes): 60, Frequency (Times/Week): 3, Weekly Exercise (Minutes/Week): 180, Intensity: Moderate,  Exercise limited by: None identified ? ? Goals   ? ?  DIET - INCREASE WATER INTAKE   ?  DIET - REDUCE SUGAR INTAKE   ?  A1c 6.27 March 2021 ?  ? ?  ? ?Fall Risk ? ?  05/14/2021  ? 12:45 PM 05/07/2021  ? 10:52 AM 04/17/2021  ? 11:09 AM 02/20/2021  ? 11:31 AM 10/09/2020  ?  8:54 AM  ?Fall Risk   ?Falls in the past year? 0 0 0 0 0  ?Number falls in past yr: 0 0 0  0  ?Injury with Fall? 0 0 0  0  ?Risk for fall due to : No Fall Risks      ? ?  Patients reports normal gait and balance. Patient does not use assistive devices to ambulate.  ? ?Is the patient's home free of loose throw rugs in walkways, pet beds, electrical cords, etc?   yes ?     Grab bars in the bathroom? yes ?     Handrails on the stairs?   yes ?     Adequate lighting?   yes ? ?Depression Screen ? ?  05/14/2021  ? 12:43 PM 05/07/2021  ? 10:53 AM 04/17/2021  ? 11:09 AM 02/20/2021  ? 11:31 AM  ?PHQ 2/9 Scores  ?PHQ - 2 Score 0 0 0 0  ?PHQ- 9 Score 0 0 0 0  ?  ?Cognitive Function ? ?  05/14/2021  ? 12:47 PM 08/02/2018  ?  9:53 AM  ?6CIT Screen  ?What Year? 0 points 0 points  ?What month? 0 points 0 points  ?What time? 0 points 0 points  ?Count back from 20 0 points 0 points  ?Months in reverse 0 points 0 points  ?Repeat phrase 0 points 0 points  ?Total Score 0 points 0 points  ? ?Immunization History  ?Administered Date(s) Administered  ? Fluad Quad(high Dose 65+) 01/05/2020, 10/09/2020  ? Influenza, High Dose Seasonal PF 11/02/2017  ? Influenza,inj,Quad PF,6+ Mos 02/19/2015, 10/24/2015, 12/01/2016, 02/24/2018, 10/05/2018  ? Moderna SARS-COV2 Booster Vaccination 12/05/2020  ? Moderna Sars-Covid-2 Vaccination 03/24/2019, 04/21/2019  ? PFIZER(Purple Top)SARS-COV-2 Vaccination 01/05/2020  ? Pneumococcal Conjugate-13 04/22/2016  ? Pneumococcal Polysaccharide-23 10/05/2018  ? Zoster Recombinat (Shingrix) 04/29/2021  ? ?Screening Tests ?Health Maintenance  ?Topic Date Due  ? COVID-19 Vaccine (4 - Booster for Moderna series) 01/30/2021  ? TETANUS/TDAP  06/05/2021  ? Zoster  Vaccines- Shingrix (2 of 2) 06/24/2021  ? INFLUENZA VACCINE  08/27/2021  ? COLON CANCER SCREENING ANNUAL FOBT  04/26/2022  ? MAMMOGRAM  12/29/2022  ? Pneumonia Vaccine 13+ Years old  Completed  ? DEXA SCAN  Completed  ? Hep

## 2021-05-14 NOTE — Patient Instructions (Addendum)
Arta ?Thank you for taking time to come for your Medicare Wellness Visit. I appreciate your ongoing commitment to your health goals. Please review the following plan we discussed and let me know if I can assist you in the future.  ?  ?These are the goals we discussed: ? ? Goals   ? ?  DIET - INCREASE WATER INTAKE   ?  DIET - REDUCE SUGAR INTAKE   ?  A1c 6.27 March 2021 ?  ? ?  ? ?We also discussed recommended health maintenance. Please call our office and schedule a visit. As discussed, you are up to date with everything! ?Health Maintenance  ?Topic Date Due  ? COVID-19 Vaccine (4 - Booster for Moderna series) 01/30/2021  ? TETANUS/TDAP  06/05/2021  ? Zoster Vaccines- Shingrix (2 of 2) 06/24/2021  ? INFLUENZA VACCINE  08/27/2021  ? COLON CANCER SCREENING ANNUAL FOBT  04/26/2022  ? MAMMOGRAM  12/29/2022  ? Pneumonia Vaccine 87+ Years old  Completed  ? DEXA SCAN  Completed  ? Hepatitis C Screening  Completed  ? HPV VACCINES  Aged Out  ? COLONOSCOPY (Pts 45-39yrs Insurance coverage will need to be confirmed)  Discontinued  ? ?PCP apt scheduled for 05/28/2021 ?#2 Shingles Vaccine due on or after 06/24/2021 ?Fill out advance directive packe ? ?Preventive Care 32 Years and Older, Female ?Preventive care refers to lifestyle choices and visits with your health care provider that can promote health and wellness. Preventive care visits are also called wellness exams. ?What can I expect for my preventive care visit? ?Counseling ?Your health care provider may ask you questions about your: ?Medical history, including: ?Past medical problems. ?Family medical history. ?Pregnancy and menstrual history. ?History of falls. ?Current health, including: ?Memory and ability to understand (cognition). ?Emotional well-being. ?Home life and relationship well-being. ?Sexual activity and sexual health. ?Lifestyle, including: ?Alcohol, nicotine or tobacco, and drug use. ?Access to firearms. ?Diet, exercise, and sleep habits. ?Work and work  Statistician. ?Sunscreen use. ?Safety issues such as seatbelt and bike helmet use. ?Physical exam ?Your health care provider will check your: ?Height and weight. These may be used to calculate your BMI (body mass index). BMI is a measurement that tells if you are at a healthy weight. ?Waist circumference. This measures the distance around your waistline. This measurement also tells if you are at a healthy weight and may help predict your risk of certain diseases, such as type 2 diabetes and high blood pressure. ?Heart rate and blood pressure. ?Body temperature. ?Skin for abnormal spots. ?What immunizations do I need? ? ?Vaccines are usually given at various ages, according to a schedule. Your health care provider will recommend vaccines for you based on your age, medical history, and lifestyle or other factors, such as travel or where you work. ?What tests do I need? ?Screening ?Your health care provider may recommend screening tests for certain conditions. This may include: ?Lipid and cholesterol levels. ?Hepatitis C test. ?Hepatitis B test. ?HIV (human immunodeficiency virus) test. ?STI (sexually transmitted infection) testing, if you are at risk. ?Lung cancer screening. ?Colorectal cancer screening. ?Diabetes screening. This is done by checking your blood sugar (glucose) after you have not eaten for a while (fasting). ?Mammogram. Talk with your health care provider about how often you should have regular mammograms. ?BRCA-related cancer screening. This may be done if you have a family history of breast, ovarian, tubal, or peritoneal cancers. ?Bone density scan. This is done to screen for osteoporosis. ?Talk with your health care provider about  your test results, treatment options, and if necessary, the need for more tests. ?Follow these instructions at home: ?Eating and drinking ? ?Eat a diet that includes fresh fruits and vegetables, whole grains, lean protein, and low-fat dairy products. Limit your intake of  foods with high amounts of sugar, saturated fats, and salt. ?Take vitamin and mineral supplements as recommended by your health care provider. ?Do not drink alcohol if your health care provider tells you not to drink. ?If you drink alcohol: ?Limit how much you have to 0-1 drink a day. ?Know how much alcohol is in your drink. In the [place] one drink equals one 12 oz bottle of beer (355 mL), one 5 oz glass of wine (148 mL), or one 1? oz glass of hard liquor (44 mL). ?Lifestyle ?Brush your teeth every morning and night with fluoride toothpaste. Floss one time each day. ?Exercise for at least 30 minutes 5 or more days each week. ?Do not use any products that contain nicotine or tobacco. These products include cigarettes, chewing tobacco, and vaping devices, such as e-cigarettes. If you need help quitting, ask your health care provider. ?Do not use drugs. ?If you are sexually active, practice safe sex. Use a condom or other form of protection in order to prevent STIs. ?Take aspirin only as told by your health care provider. Make sure that you understand how much to take and what form to take. Work with your health care provider to find out whether it is safe and beneficial for you to take aspirin daily. ?Ask your health care provider if you need to take a cholesterol-lowering medicine (statin). ?Find healthy ways to manage stress, such as: ?Meditation, yoga, or listening to music. ?Journaling. ?Talking to a trusted person. ?Spending time with friends and family. ?Minimize exposure to UV radiation to reduce your risk of skin cancer. ?Safety ?Always wear your seat belt while driving or riding in a vehicle. ?Do not drive: ?If you have been drinking alcohol. Do not ride with someone who has been drinking. ?When you are tired or distracted. ?While texting. ?If you have been using any mind-altering substances or drugs. ?Wear a helmet and other protective equipment during sports activities. ?If you have firearms in your house,  make sure you follow all gun safety procedures. ?What's next? ?Visit your health care provider once a year for an annual wellness visit. ?Ask your health care provider how often you should have your eyes and teeth checked. ?Stay up to date on all vaccines. ?This information is not intended to replace advice given to you by your health care provider. Make sure you discuss any questions you have with your health care provider. ?Document Revised: 07/11/2020 Document Reviewed: 07/11/2020 ?Elsevier Patient Education ? [place]. ? ? ?Diet Recommendations for Diabetes  ? ?1. Eat at least 3 meals and 1-2 snacks per day. Never go more than 4-5 hours while awake without eating. Eat breakfast within the first hour of getting up.   ?2. Limit starchy foods to TWO per meal and ONE per snack. ONE portion of a starchy  food is equal to the following:  ? - ONE slice of bread (or its equivalent, such as half of a hamburger bun).  ? - 1/2 cup of a "scoopable" starchy food such as potatoes or rice.  ? - 15 grams of Total Carbohydrate as shown on food label.  ?3. Include at every meal: a protein food, a carb food, and vegetables and/or fruit.  ? - Obtain twice  the volume of vegetables as protein or carbohydrate foods for both lunch and dinner.  ? - Fresh or frozen vegetables are best.  ? - Keep frozen vegetables on hand for a quick vegetable serving.    ?  ? ?Starchy (carb) foods: Bread, rice, pasta, potatoes, corn, cereal, grits, crackers, bagels, muffins, all baked goods.  (Fruits, milk, and yogurt also have carbohydrate, but most of these foods will not spike your blood sugar as most starchy foods will.)  A few fruits do cause high blood sugars; use small portions of bananas (limit to 1/2 at a time), grapes, watermelon, oranges, and most tropical fruits.   ? ?Protein foods: Meat, fish, poultry, eggs, dairy foods, and beans such as pinto and kidney beans (beans also provide carbohydrate).  ?  ?Our clinic's number is  [phone]. Please call with questions or concerns about what we discussed today.  ?  ?

## 2021-05-15 ENCOUNTER — Other Ambulatory Visit: Payer: Self-pay | Admitting: Family Medicine

## 2021-05-15 ENCOUNTER — Telehealth: Payer: Self-pay

## 2021-05-15 NOTE — Telephone Encounter (Signed)
-----   Message from Carolyne Littles sent at 05/15/2021  3:45 PM EDT ----- ?Regarding: refill request ?Pt is asking for a refill on Acetaminophen-Codeine 300-30 MG tablet ? ?

## 2021-05-16 NOTE — Telephone Encounter (Signed)
Patient already scheduled 05/2021.  Kimberly Guerrero,CMA ? ?

## 2021-05-16 NOTE — Telephone Encounter (Signed)
Please send to provider who prescribes ? ?Thank you ?

## 2021-05-17 MED ORDER — ACETAMINOPHEN-CODEINE 300-30 MG PO TABS: ORAL_TABLET | ORAL | 0 refills | Status: DC

## 2021-05-23 ENCOUNTER — Other Ambulatory Visit: Payer: Self-pay

## 2021-05-23 ENCOUNTER — Encounter (HOSPITAL_COMMUNITY)
Admission: RE | Admit: 2021-05-23 | Discharge: 2021-05-23 | Disposition: A | Discharge status: Home / Self Care | Payer: Medicare Other | Source: Ambulatory Visit | Attending: Obstetrics and Gynecology | Admitting: Obstetrics and Gynecology

## 2021-05-23 ENCOUNTER — Encounter (HOSPITAL_BASED_OUTPATIENT_CLINIC_OR_DEPARTMENT_OTHER): Payer: Self-pay | Admitting: Obstetrics and Gynecology

## 2021-05-23 DIAGNOSIS — N840 Polyp of corpus uteri: Secondary | ICD-10-CM | POA: Insufficient documentation

## 2021-05-23 DIAGNOSIS — Z01818 Encounter for other preprocedural examination: Secondary | ICD-10-CM | POA: Diagnosis not present

## 2021-05-23 DIAGNOSIS — N950 Postmenopausal bleeding: Secondary | ICD-10-CM | POA: Insufficient documentation

## 2021-05-23 LAB — BASIC METABOLIC PANEL
Anion gap: 6 (ref 5–15)
BUN: 24 mg/dL — ABNORMAL HIGH (ref 8–23)
CO2: 27 mmol/L (ref 22–32)
Calcium: 9.8 mg/dL (ref 8.9–10.3)
Chloride: 109 mmol/L (ref 98–111)
Creatinine, Ser: 0.76 mg/dL (ref 0.44–1.00)
GFR, Estimated: >60 mL/min (ref >60)
Glucose, Bld: 112 mg/dL — ABNORMAL HIGH (ref 70–99)
Potassium: 3.8 mmol/L (ref 3.5–5.1)
Sodium: 142 mmol/L (ref 135–145)

## 2021-05-23 LAB — CBC
HCT: 39.1 % (ref 36.0–46.0)
Hemoglobin: 12.1 g/dL (ref 12.0–15.0)
MCH: 27.9 pg (ref 26.0–34.0)
MCHC: 30.9 g/dL (ref 30.0–36.0)
MCV: 90.1 fL (ref 80.0–100.0)
Platelets: 147 10*3/uL — ABNORMAL LOW (ref 150–400)
RBC: 4.34 MIL/uL (ref 3.87–5.11)
RDW: 14.1 % (ref 11.5–15.5)
WBC: 6 10*3/uL (ref 4.0–10.5)
nRBC: 0 % (ref 0.0–0.2)

## 2021-05-23 NOTE — Progress Notes (Signed)
Spoke w/ via phone for pre-op interview--- pt ?Lab needs dos---- no              ?Lab results------ pt had lab work done 05-23-2021 [facility]/ BMP/ EKG results in epic ?COVID test -----patient states asymptomatic no test needed ?Arrive at ------- 2449 on 05-27-2021 ?NPO after MN NO Solid Food.  Clear liquids from MN until--- 0645 ?Med rec completed ?Medications to take morning of surgery ----- lipitor, gabapentin, zyrtec, tylenol/ codeine ?Diabetic medication ----- n/a ?Patient instructed no nail polish to be worn day of surgery ?Patient instructed to bring photo id and insurance card day of surgery ?Patient aware to have Driver (ride ) / caregiver for 24 hours after surgery --- daughter, Volney Presser ?Patient Special Instructions ----- n/a ?Pre-Op special Istructions ----- n/a ?Patient verbalized understanding of instructions that were given at this phone interview. ?Patient denies shortness of breath, chest pain, fever, cough at this phone interview.  ?

## 2021-05-27 ENCOUNTER — Ambulatory Visit (HOSPITAL_BASED_OUTPATIENT_CLINIC_OR_DEPARTMENT_OTHER): Payer: Medicare Other | Admitting: Anesthesiology

## 2021-05-27 ENCOUNTER — Encounter (HOSPITAL_BASED_OUTPATIENT_CLINIC_OR_DEPARTMENT_OTHER)
Admission: RE | Disposition: A | Discharge status: Home / Self Care | Payer: Self-pay | Source: Ambulatory Visit | Attending: Obstetrics and Gynecology

## 2021-05-27 ENCOUNTER — Ambulatory Visit (HOSPITAL_BASED_OUTPATIENT_CLINIC_OR_DEPARTMENT_OTHER)
Admission: RE | Admit: 2021-05-27 | Discharge: 2021-05-27 | Disposition: A | Discharge status: Home / Self Care | Payer: Medicare Other | Source: Ambulatory Visit | Attending: Obstetrics and Gynecology | Admitting: Obstetrics and Gynecology

## 2021-05-27 ENCOUNTER — Encounter (HOSPITAL_BASED_OUTPATIENT_CLINIC_OR_DEPARTMENT_OTHER): Payer: Self-pay | Admitting: Obstetrics and Gynecology

## 2021-05-27 ENCOUNTER — Other Ambulatory Visit: Payer: Self-pay

## 2021-05-27 DIAGNOSIS — M5416 Radiculopathy, lumbar region: Secondary | ICD-10-CM | POA: Diagnosis not present

## 2021-05-27 DIAGNOSIS — N950 Postmenopausal bleeding: Secondary | ICD-10-CM | POA: Diagnosis present

## 2021-05-27 DIAGNOSIS — Z79899 Other long term (current) drug therapy: Secondary | ICD-10-CM | POA: Diagnosis not present

## 2021-05-27 DIAGNOSIS — I340 Nonrheumatic mitral (valve) insufficiency: Secondary | ICD-10-CM

## 2021-05-27 DIAGNOSIS — I10 Essential (primary) hypertension: Secondary | ICD-10-CM | POA: Diagnosis not present

## 2021-05-27 DIAGNOSIS — N840 Polyp of corpus uteri: Secondary | ICD-10-CM | POA: Diagnosis not present

## 2021-05-27 DIAGNOSIS — M1990 Unspecified osteoarthritis, unspecified site: Secondary | ICD-10-CM

## 2021-05-27 DIAGNOSIS — G709 Myoneural disorder, unspecified: Secondary | ICD-10-CM

## 2021-05-27 HISTORY — PX: DILATATION & CURETTAGE/HYSTEROSCOPY WITH MYOSURE: SHX6511

## 2021-05-27 HISTORY — DX: Presence of spectacles and contact lenses: Z97.3

## 2021-05-27 HISTORY — DX: Postmenopausal bleeding: N95.0

## 2021-05-27 HISTORY — DX: Unspecified osteoarthritis, unspecified site: M19.90

## 2021-05-27 HISTORY — DX: Polyp of corpus uteri: N84.0

## 2021-05-27 HISTORY — DX: Other intervertebral disc degeneration, lumbosacral region without mention of lumbar back pain or lower extremity pain: M51.379

## 2021-05-27 HISTORY — DX: Other chronic pain: G89.29

## 2021-05-27 HISTORY — DX: Other intervertebral disc degeneration, lumbosacral region: M51.37

## 2021-05-27 SURGERY — DILATATION & CURETTAGE/HYSTEROSCOPY WITH MYOSURE
Anesthesia: General | Site: Vagina

## 2021-05-27 MED ORDER — PROPOFOL 10 MG/ML IV BOLUS
INTRAVENOUS | Status: DC | PRN
  Administered 2021-05-27: 150 mg via INTRAVENOUS

## 2021-05-27 MED ORDER — SODIUM CHLORIDE 0.9 % IR SOLN
Status: DC | PRN
  Administered 2021-05-27: 2000 mL

## 2021-05-27 MED ORDER — POVIDONE-IODINE 10 % EX SWAB: 2.0000 "application " | Freq: Once | CUTANEOUS | Status: DC

## 2021-05-27 MED ORDER — MIDAZOLAM HCL 2 MG/2ML IJ SOLN
INTRAMUSCULAR | Status: DC | PRN
  Administered 2021-05-27: 1 mg via INTRAVENOUS

## 2021-05-27 MED ORDER — GLYCOPYRROLATE PF 0.2 MG/ML IJ SOSY
PREFILLED_SYRINGE | INTRAMUSCULAR | Status: AC
  Filled 2021-05-27: qty 1

## 2021-05-27 MED ORDER — IBUPROFEN 600 MG PO TABS: 600.0000 mg | ORAL_TABLET | Freq: Four times a day (QID) | ORAL | 0 refills | Status: DC | PRN

## 2021-05-27 MED ORDER — PROPOFOL 10 MG/ML IV BOLUS
INTRAVENOUS | Status: AC
Start: 2021-05-27 — End: ?
  Filled 2021-05-27: qty 20

## 2021-05-27 MED ORDER — ACETAMINOPHEN 500 MG PO TABS: 1000.0000 mg | ORAL_TABLET | Freq: Once | ORAL | Status: DC

## 2021-05-27 MED ORDER — DEXAMETHASONE SODIUM PHOSPHATE 10 MG/ML IJ SOLN
INTRAMUSCULAR | Status: AC
  Filled 2021-05-27: qty 5

## 2021-05-27 MED ORDER — LACTATED RINGERS IV SOLN: INTRAVENOUS | Status: DC

## 2021-05-27 MED ORDER — OXYCODONE HCL 5 MG PO TABS: 5.0000 mg | ORAL_TABLET | Freq: Once | ORAL | Status: DC | PRN

## 2021-05-27 MED ORDER — ONDANSETRON HCL 4 MG/2ML IJ SOLN
INTRAMUSCULAR | Status: AC
  Filled 2021-05-27: qty 18

## 2021-05-27 MED ORDER — KETOROLAC TROMETHAMINE 30 MG/ML IJ SOLN
INTRAMUSCULAR | Status: DC | PRN
  Administered 2021-05-27: 30 mg via INTRAVENOUS

## 2021-05-27 MED ORDER — ONDANSETRON HCL 4 MG/2ML IJ SOLN
INTRAMUSCULAR | Status: DC | PRN
  Administered 2021-05-27: 4 mg via INTRAVENOUS

## 2021-05-27 MED ORDER — HYDROMORPHONE HCL 2 MG/ML IJ SOLN
INTRAMUSCULAR | Status: AC
  Filled 2021-05-27: qty 1

## 2021-05-27 MED ORDER — LIDOCAINE HCL (PF) 2 % IJ SOLN
INTRAMUSCULAR | Status: AC
  Filled 2021-05-27: qty 35

## 2021-05-27 MED ORDER — FENTANYL CITRATE (PF) 100 MCG/2ML IJ SOLN: 25.0000 ug | INTRAMUSCULAR | Status: DC | PRN

## 2021-05-27 MED ORDER — KETOROLAC TROMETHAMINE 30 MG/ML IJ SOLN
INTRAMUSCULAR | Status: AC
  Filled 2021-05-27: qty 5

## 2021-05-27 MED ORDER — LIDOCAINE 2% (20 MG/ML) 5 ML SYRINGE
INTRAMUSCULAR | Status: DC | PRN
Start: 2021-05-27 — End: 2021-05-27
  Administered 2021-05-27: 60 mg via INTRAVENOUS

## 2021-05-27 MED ORDER — MIDAZOLAM HCL 2 MG/2ML IJ SOLN
INTRAMUSCULAR | Status: AC
Start: 2021-05-27 — End: ?
  Filled 2021-05-27: qty 2

## 2021-05-27 MED ORDER — ACETAMINOPHEN 500 MG PO TABS
ORAL_TABLET | ORAL | Status: AC
  Filled 2021-05-27: qty 2

## 2021-05-27 MED ORDER — LIDOCAINE HCL 1 % IJ SOLN
INTRAMUSCULAR | Status: DC | PRN
  Administered 2021-05-27: 10 mL

## 2021-05-27 MED ORDER — ONDANSETRON HCL 4 MG/2ML IJ SOLN: 4.0000 mg | Freq: Once | INTRAMUSCULAR | Status: DC | PRN

## 2021-05-27 MED ORDER — OXYCODONE HCL 5 MG/5ML PO SOLN: 5.0000 mg | Freq: Once | ORAL | Status: DC | PRN

## 2021-05-27 MED ORDER — FENTANYL CITRATE (PF) 250 MCG/5ML IJ SOLN
INTRAMUSCULAR | Status: DC | PRN
  Administered 2021-05-27: 50 ug via INTRAVENOUS

## 2021-05-27 MED ORDER — AMISULPRIDE (ANTIEMETIC) 5 MG/2ML IV SOLN: 10.0000 mg | Freq: Once | INTRAVENOUS | Status: DC | PRN

## 2021-05-27 MED ORDER — DEXAMETHASONE SODIUM PHOSPHATE 10 MG/ML IJ SOLN
INTRAMUSCULAR | Status: DC | PRN
  Administered 2021-05-27: 5 mg via INTRAVENOUS

## 2021-05-27 MED ORDER — EPHEDRINE SULFATE-NACL 50-0.9 MG/10ML-% IV SOSY
PREFILLED_SYRINGE | INTRAVENOUS | Status: DC | PRN
  Administered 2021-05-27 (×3): 5 mg via INTRAVENOUS

## 2021-05-27 MED ORDER — FENTANYL CITRATE (PF) 100 MCG/2ML IJ SOLN
INTRAMUSCULAR | Status: AC
Start: 2021-05-27 — End: ?
  Filled 2021-05-27: qty 2

## 2021-05-27 MED ORDER — GLYCOPYRROLATE 0.2 MG/ML IJ SOLN
INTRAMUSCULAR | Status: DC | PRN
  Administered 2021-05-27: .2 mg via INTRAVENOUS

## 2021-05-27 SURGICAL SUPPLY — 20 items
CATH ROBINSON RED A/P 16FR (CATHETERS) ×2 IMPLANT
DEVICE MYOSURE LITE (MISCELLANEOUS) IMPLANT
DEVICE MYOSURE REACH (MISCELLANEOUS) ×1 IMPLANT
DILATOR CANAL MILEX (MISCELLANEOUS) ×1 IMPLANT
DRSG TELFA 3X8 NADH (GAUZE/BANDAGES/DRESSINGS) ×2 IMPLANT
GAUZE 4X4 16PLY ~~LOC~~+RFID DBL (SPONGE) ×3 IMPLANT
GLOVE BIO SURGEON STRL SZ 6.5 (GLOVE) ×2 IMPLANT
GOWN STRL REUS W/TWL LRG LVL3 (GOWN DISPOSABLE) ×2 IMPLANT
IV NS IRRIG 3000ML ARTHROMATIC (IV SOLUTION) ×2 IMPLANT
KIT PROCEDURE FLUENT (KITS) ×2 IMPLANT
KIT TURNOVER CYSTO (KITS) ×2 IMPLANT
MYOSURE XL FIBROID (MISCELLANEOUS)
PACK VAGINAL MINOR WOMEN LF (CUSTOM PROCEDURE TRAY) ×2 IMPLANT
PAD DRESSING TELFA 3X8 NADH (GAUZE/BANDAGES/DRESSINGS) ×1 IMPLANT
PAD OB MATERNITY 4.3X12.25 (PERSONAL CARE ITEMS) ×2 IMPLANT
SEAL CERVICAL OMNI LOK (ABLATOR) IMPLANT
SEAL ROD LENS SCOPE MYOSURE (ABLATOR) ×2 IMPLANT
SOL PREP POV-IOD 4OZ 10% (MISCELLANEOUS) ×1 IMPLANT
SYSTEM TISS REMOVAL MYOSURE XL (MISCELLANEOUS) IMPLANT
TOWEL OR 17X26 10 PK STRL BLUE (TOWEL DISPOSABLE) ×2 IMPLANT

## 2021-05-27 NOTE — Anesthesia Procedure Notes (Signed)
Procedure Name: LMA Insertion ?Date/Time: 05/27/2021 10:08 AM ?Performed by: Clearnce Sorrel, CRNA ?Pre-anesthesia Checklist: Patient identified, Emergency Drugs available, Suction available and Patient being monitored ?Patient Re-evaluated:Patient Re-evaluated prior to induction ?Oxygen Delivery Method: Circle System Utilized ?Preoxygenation: Pre-oxygenation with 100% oxygen ?Induction Type: IV induction ?Ventilation: Mask ventilation without difficulty ?LMA: LMA inserted ?LMA Size: 4.0 ?Number of attempts: 1 ?Airway Equipment and Method: Bite block ?Placement Confirmation: positive ETCO2 ?Tube secured with: Tape ?Dental Injury: Teeth and Oropharynx as per pre-operative assessment  ? ? ? ? ?

## 2021-05-27 NOTE — Op Note (Signed)
OPERATIVE REPORT ? ? ?PREOPERATIVE DIAGNOSES:   Postmenopausal bleeding, endometrial polyp ? ?POSTOPERATIVE DIAGNOSES:   Postmenopausal bleeding, endometrial polyp ? ?PROCEDURE:  Hysteroscopy with dilation and curettage and Myosure resection of endometrial polyp ? ?SURGEON:  Lenard Galloway, MD ? ?ANESTHESIA:  LMA, paracervical block with 10 mL of 1% lidocaine. ? ?IV FLUIDS:   400 cc LR ? ?EBL:   5 cc. ? ?URINE OUTPUT:   10 cc by in and out catherization ? ?NORMAL SALINE DEFICIT:    35 cc ? ?COMPLICATIONS:  None. ? ?INDICATIONS FOR THE PROCEDURE:    ? ?The patient is a 71 year old [id number] African American female, who presents with postmenopausal bleeding.  ?She initially had a cervical polyp removed by her primary care provider.  The bleeding continued after this, and she was referral for gynecologic care.  ?Pelvic ultrasound showed multiple intramural and subserous fibroids, fluid in the endometrial cavity, and a mass 6 x 3 mm.  ?Endometrial biopsy showed a benign polyp and low grade cervical dysplasia.  ?Colposcopy with cervical biopsy was performed and this showed koilocytic atypia suggestive of low grade squamous intraepithelial lesion and an ECC showing low grade squamous intraepithelial lesion and benign endocervical tissue.  ?  ?A plan is now made to proceed with a hysteroscopy with dilation and curettage and Myosurgical resection of endometrial polyp; after risks, benefits and alternatives were reviewed. ? ?FINDINGS:  Exam under anesthesia revealed a small anteverted, mobile uterus.  No adnexal masses were noted.  ?The uterus was sounded to 6 cm ?Hysteroscopy showed a 1 cm polyp of the left cornual region.  There was an area of potential sessile polyp formation of the right posterior cornual region.  Both ares were resected.  ?The tubal ostia regions were normal.  ?Endometrial currettings were scant ? ?SPECIMENS:   The endometrial polyp and the curettings were sent to pathology separately.  ? ?PROCEDURE IN  DETAIL:  The patient was reidentified in the preoperative ?hold area.  She received TED hose and PAS stockings for DVT prophylaxis. ? ?In the operating room, the patient was placed in the dorsal lithotomy ?position and then an LMA anesthetic was introduced.  The patient's lower ?abdomen, vagina and perineum were sterilely prepped with Betadine and the  ?patient's bladder was catheterized of urine.  She was sterilly draped. ? ?An exam under anesthesia was performed. ? ?A speculum was placed inside the vagina and a single-tooth tenaculum was ?placed on the anterior cervical lip.  A paracervical block was performed ?with a total of 10 mL of 1% lidocaine plain.  The uterus was sounded. ?The cervix was dilated to a #21 Pratt dilator.  The MyoSure hysteroscope ?was then inserted inside the uterine cavity under the continuous infusion of normal saline solution.   ?Findings are as noted above.  The Myosure Reach device was used to resect the endometrial polyps without difficulty.  ?The MyoSure hysteroscope was removed after the polyps were resected.  ?The serrated and then sharp curettes were introduced into the uterine cavity and the endometrium was curetted in all 4 quadrants.   ?A minimal amount of endometrial curettings was obtained.  This specimen was sent to Pathology. ? ?The single-tooth tenaculum which had been placed on the anterior cervical lip was removed.     ?Hemostasis was good, and all of the vaginal instruments were removed. ? ?The patient was awakened and escorted to the recovery room in stable ?condition after she was cleansed of Betadine.  There were no complications  to the procedure.   ?All needle, instrument and sponge counts were correct. ? ?Lenard Galloway, MD  ? ?

## 2021-05-27 NOTE — Anesthesia Postprocedure Evaluation (Signed)
Anesthesia Post Note ? ?Patient: Kimberly Guerrero ? ?Procedure(s) Performed: DILATATION & CURETTAGE/HYSTEROSCOPY WITH MYOSURE (Vagina ) ? ?  ? ?Patient location during evaluation: PACU ?Anesthesia Type: General ?Level of consciousness: awake ?Pain management: pain level controlled ?Vital Signs Assessment: post-procedure vital signs reviewed and stable ?Respiratory status: spontaneous breathing and respiratory function stable ?Cardiovascular status: stable ?Postop Assessment: no apparent nausea or vomiting ?Anesthetic complications: no ? ? ?No notable events documented. ? ?Last Vitals:  ?Vitals:  ? 05/27/21 1115 05/27/21 1130  ?BP: (!) 146/66 (!) 154/73  ?Pulse: (!) 52 (!) 57  ?Resp: 15 (!) 22  ?Temp:    ?SpO2: 99% 100%  ?  ?Last Pain:  ?Vitals:  ? 05/27/21 1115  ?TempSrc:   ?PainSc: 0-No pain  ? ? ?  ?  ?  ?  ?  ?  ? ?Merlinda Frederick ? ? ? ? ?

## 2021-05-27 NOTE — Transfer of Care (Signed)
Immediate Anesthesia Transfer of Care Note ? ?Patient: Kimberly Guerrero ? ?Procedure(s) Performed: DILATATION & CURETTAGE/HYSTEROSCOPY WITH MYOSURE (Vagina ) ? ?Patient Location: PACU ? ?Anesthesia Type:General ? ?Level of Consciousness: drowsy ? ?Airway & Oxygen Therapy: Patient Spontanous Breathing ? ?Post-op Assessment: Report given to RN and Post -op Vital signs reviewed and stable ? ?Post vital signs: Reviewed and stable ? ?Last Vitals:  ?Vitals Value Taken Time  ?BP 147/76 05/27/21 1047  ?Temp    ?Pulse 66 05/27/21 1050  ?Resp 13 05/27/21 1050  ?SpO2 100 % 05/27/21 1050  ?Vitals shown include unvalidated device data. ? ?Last Pain:  ?Vitals:  ? 05/27/21 0808  ?TempSrc: Oral  ?PainSc: 0-No pain  ?   ? ?Patients Stated Pain Goal: 4 (05/27/21 6270) ? ?Complications: No notable events documented. ?

## 2021-05-27 NOTE — Discharge Instructions (Addendum)
Hello Ms. Kimberly Guerrero,  ? ?Surgery went well today! ? ?I found and removal a polyp inside your uterine cavity. ?This will be sent for biopsy and analysis.   ? ?I will see you in the office for your postop visit.  ? ?Josefa Half, MD ? ? ? ? ? ?Post Anesthesia Home Care Instructions ? ?Activity: ?Get plenty of rest for the remainder of the day. A responsible individual must stay with you for at least 24 hours following the procedure.  ?For the next 24 hours, DO NOT: ?-Drive a car ?-Paediatric nurse ?-Drink alcoholic beverages ?-Take any medication unless instructed by your physician ?-Make any legal decisions or sign important papers. ? ?Meals: ?Start with liquid foods such as gelatin or soup. Progress to regular foods as tolerated. Avoid greasy, spicy, heavy foods. If nausea and/or vomiting occur, drink only clear liquids until the nausea and/or vomiting subsides. Call your physician if vomiting continues. ? ?Special Instructions/Symptoms: ?Your throat may feel dry or sore from the anesthesia or the breathing tube placed in your throat during surgery. If this causes discomfort, gargle with warm salt water. The discomfort should disappear within 24 hours. ? ?DISCHARGE INSTRUCTIONS: HYSTEROSCOPY / ENDOMETRIAL ABLATION ?The following instructions have been prepared to help you care for yourself upon your return home. ? ?May Remove Scop patch on or before ? ?May take Ibuprofen after ? ?May take stool softner while taking narcotic pain medication to prevent constipation.  Drink plenty of water. ? ?Personal hygiene: ? Use sanitary pads for vaginal drainage, not tampons. ? Shower the day after your procedure. ? NO tub baths, pools or Jacuzzis for 2-3 weeks. ? Wipe front to back after using the bathroom. ? ?Activity and limitations: ? Do NOT drive or operate any equipment for 24 hours. The effects of anesthesia are still present ?and drowsiness may result. ? Do NOT rest in bed all day. ? Walking is encouraged. ? Walk up and down  stairs slowly. ? You may resume your normal activity in one to two days or as indicated by your physician. ?Sexual activity: NO intercourse for at least 2 weeks after the procedure, or as indicated by your ?Doctor. ? ?Diet: Eat a light meal as desired this evening. You may resume your usual diet tomorrow. ? ?Return to Work: You may resume your work activities in one to two days or as indicated by your ?Doctor. ? ?What to expect after your surgery: Expect to have vaginal bleeding/discharge for 2-3 days and ?spotting for up to 10 days. It is not unusual to have soreness for up to 1-2 weeks. You may have a ?slight burning sensation when you urinate for the first day. Mild cramps may continue for a couple of ?days. You may have a regular period in 2-6 weeks. ? ?Call your doctor for any of the following: ? Excessive vaginal bleeding or clotting, saturating and changing one pad every hour. ? Inability to urinate 6 hours after discharge from hospital. ? Pain not relieved by pain medication. ? Fever of 100.4? F or greater. ? Unusual vaginal discharge or odor. ? ?    ?

## 2021-05-27 NOTE — Anesthesia Preprocedure Evaluation (Addendum)
Anesthesia Evaluation  ?Patient identified by MRN, date of birth, ID band ?Patient awake ? ? ? ?Reviewed: ?Allergy & Precautions, NPO status , Patient's Chart, lab work & pertinent test results ? ?Airway ?Mallampati: I ? ?TM Distance: >3 FB ?Neck ROM: Full ? ? ? Dental ?no notable dental hx. ? ?  ?Pulmonary ?neg pulmonary ROS,  ?  ?Pulmonary exam normal ?breath sounds clear to auscultation ? ? ? ? ? ? Cardiovascular ?hypertension, Pt. on medications ?Normal cardiovascular exam+ Valvular Problems/Murmurs MR  ?Rhythm:Regular Rate:Normal ? ?Sinus bradycardia ?Anterior infarct , age undetermined ?Abnormal ECG ?When compared with ECG of 30-Dec-2009 06:15, ?PREVIOUS ECG IS PRESENT ?Criteria for Anterior infarct New since previous tracing ?Confirmed by Glori Bickers [phone]) on 05/24/2021 1:21:56 AM ? ?ECHO 01/30/20: ? ??1. Left ventricular ejection fraction, by estimation, is 60 to 65%. The  ?left ventricle has normal function. The left ventricle has no regional  ?wall motion abnormalities. Left ventricular diastolic parameters are  ?consistent with Grade I diastolic  ?dysfunction (impaired relaxation).  ??2. Right ventricular systolic function is normal. The right ventricular  ?size is normal. There is normal pulmonary artery systolic pressure. The  ?estimated right ventricular systolic pressure is 02.7 mmHg.  ??3. The mitral valve is normal in structure. Mild mitral valve  ?regurgitation. No evidence of mitral stenosis.  ??4. The aortic valve is normal in structure. Aortic valve regurgitation is  ?not visualized. No aortic stenosis is present.  ??5. The inferior vena cava is normal in size with greater than 50%  ?respiratory variability, suggesting right atrial pressure of 3 mmHg ?  ?Neuro/Psych ? Neuromuscular disease (lumbar radiculopathy) negative psych ROS  ? GI/Hepatic ?negative GI ROS, Neg liver ROS,   ?Endo/Other  ?negative endocrine ROS ? Renal/GU ?negative Renal ROS  ?negative  genitourinary ?  ?Musculoskeletal ? ?(+) Arthritis , Chronic back pain ?  ? Abdominal ?  ?Peds ?negative pediatric ROS ?(+)  Hematology ?negative hematology ROS ?(+)   ?Anesthesia Other Findings ? ? Reproductive/Obstetrics ?negative OB ROS ? ?  ? ? ? ? ? ? ? ? ? ? ? ? ? ?  ?  ? ? ? ? ? ? ? ?Anesthesia Physical ?Anesthesia Plan ? ?ASA: 3 ? ?Anesthesia Plan: General  ? ?Post-op Pain Management: Tylenol PO (pre-op)*  ? ?Induction: Intravenous ? ?PONV Risk Score and Plan: 3 and Treatment may vary due to age or medical condition, Ondansetron and Dexamethasone ? ?Airway Management Planned: LMA ? ?Additional Equipment: None ? ?Intra-op Plan:  ? ?Post-operative Plan: Extubation in OR ? ?Informed Consent: I have reviewed the patients History and Physical, chart, labs and discussed the procedure including the risks, benefits and alternatives for the proposed anesthesia with the patient or authorized representative who has indicated his/her understanding and acceptance.  ? ? ? ?Dental advisory given ? ?Plan Discussed with: CRNA, Anesthesiologist and Surgeon ? ?Anesthesia Plan Comments:   ? ? ? ? ? ? ?Anesthesia Quick Evaluation ? ?

## 2021-05-27 NOTE — Progress Notes (Signed)
Update to History and Physical ? ?No marked change in status since office preop visit.  ?EKG reviewed with patient.  ?She states a history of bradycardia for which she has seen cardiology.  ?She does not have a history of chest pain at rest or with exertion.  ?She denies a history of any cardiac events including heart attack.  ? ?Vitals:  ? 05/27/21 0808  ?BP: (!) 151/70  ?Pulse: (!) 44  ?Resp: 15  ?Temp: 97.8 ?F (36.6 ?C)  ?SpO2: 100%  ? ? ?Patient examined.  ? ?OK to proceed with surgery.  ? ?

## 2021-05-28 ENCOUNTER — Ambulatory Visit: Payer: Medicare Other | Admitting: Family Medicine

## 2021-05-28 LAB — SURGICAL PATHOLOGY

## 2021-05-29 ENCOUNTER — Encounter (HOSPITAL_BASED_OUTPATIENT_CLINIC_OR_DEPARTMENT_OTHER): Payer: Self-pay | Admitting: Obstetrics and Gynecology

## 2021-06-03 ENCOUNTER — Ambulatory Visit (INDEPENDENT_AMBULATORY_CARE_PROVIDER_SITE_OTHER): Payer: Medicare Other | Admitting: Family Medicine

## 2021-06-03 ENCOUNTER — Encounter: Payer: Self-pay | Admitting: Family Medicine

## 2021-06-03 VITALS — BP 119/71 | HR 50 | Wt 133.6 lb

## 2021-06-03 DIAGNOSIS — I10 Essential (primary) hypertension: Secondary | ICD-10-CM

## 2021-06-03 DIAGNOSIS — R001 Bradycardia, unspecified: Secondary | ICD-10-CM | POA: Diagnosis not present

## 2021-06-03 NOTE — Patient Instructions (Signed)
Thank you for coming to see me today. It was a pleasure.  ? ?You do not need to take Amlodipine now. Your blood pressure is great. ?You can check your blood pressure sporadically.  If you have any weakness, dizziness, chest pain please go the urgent care or ED ? ? ?You may need to restart medication in the future if your blood pressure increases  ? ?Continue your current exercise and diet ? ?Please follow-up with PCP in 6 months ? ?If you have any questions or concerns, please do not hesitate to call the office at [phone]. ? ?Best,  ? ?Carollee Leitz, MD   ? ? ?

## 2021-06-03 NOTE — Progress Notes (Signed)
    SUBJECTIVE:   CHIEF COMPLAINT / HPI: Blood pressure check  Presents for follow up for blood pressure. Seen in clinic on 04/11 amlodipine was discontinued at that time for normal blood pressure.  Since then patient reports blood pressures at home have remained 110s/70s.  Headaches, visual changes, shortness of breath, chest pain or lower extremity edema.  PERTINENT  PMH / PSH:  Hypertension Bradycardia  OBJECTIVE:   BP 119/71   Pulse (!) 50   Wt 133 lb 9.6 oz (60.6 kg)   BMI 20.31 kg/m    General: Alert, no acute distress Cardio: Normal S1 and S2, RRR, no r/m/g Pulm: CTAB, normal work of breathing Extremities: No peripheral edema.    ASSESSMENT/PLAN:   Essential hypertension Normotensive.  Asymptomatic. Recommend continue diet and exercise for blood pressure control. Discussed with patient that in future if blood pressure becomes elevated may need to restart medication Can monitor blood pressure at home once or twice a week or if becomes symptomatic Strict return precautions Follow-up as needed in 6 months.    Bradycardia Remains asymptomatic Continue to monitor     Carollee Leitz, MD [facility]

## 2021-06-04 ENCOUNTER — Encounter: Payer: Self-pay | Admitting: Family Medicine

## 2021-06-04 NOTE — Assessment & Plan Note (Signed)
Normotensive.  Asymptomatic. ?Recommend continue diet and exercise for blood pressure control. ?Discussed with patient that in future if blood pressure becomes elevated may need to restart medication ?Can monitor blood pressure at home once or twice a week or if becomes symptomatic ?Strict return precautions ?Follow-up as needed in 6 months. ? ? ?

## 2021-06-04 NOTE — Assessment & Plan Note (Addendum)
Remains asymptomatic ?Continue to monitor ?

## 2021-06-06 NOTE — Progress Notes (Signed)
GYNECOLOGY  VISIT ?  ?HPI: ?71 y.o.   Divorced  [place]  female   ?G1P1001 with No LMP recorded. Patient is postmenopausal.   ?here for  post op appointment ? ?Status post Hysteroscopy with dilation and curettage and Myosure resection of endometrial polyp on 05/27/21.  ?Pathology:  benign endometrial polyp and inactive endometrium.  ? ?Bleeding has almost stopped.  ?Now with light yellow discharge.  ?Took very little pain medication. ?  ?Her preop EKG ordered by cardiology showed bradycardia and possible anterior MI, age undetermined.  ?She has seen Dr. Marlou Porch in the past for bradycardia.  ?She denies prior MI. ? ?GYNECOLOGIC HISTORY: ?No LMP recorded. Patient is postmenopausal. ?Contraception:  post menopausal  ?Menopausal hormone therapy:  none  ?Last mammogram:  12-28-20 density B/BIRADS 1 negative  ?Last pap smear:   03-01-21 negative.  Colposcopy showed LGSIL and koilocytic atypia.  ?       ?OB History   ? ? Gravida  ?1  ? Para  ?1  ? Term  ?1  ? Preterm  ?   ? AB  ?   ? Living  ?1  ?  ? ? SAB  ?   ? IAB  ?   ? Ectopic  ?   ? Multiple  ?   ? Live Births  ?   ?   ?  ?  ?    ? ?Patient Active Problem List  ? Diagnosis Date Noted  ? Health care maintenance 04/21/2021  ? Postmenopausal vaginal bleeding 01/13/2021  ? Bradycardia 01/09/2020  ? Essential hypertension 01/09/2020  ? Heart murmur previously undiagnosed 01/09/2020  ? 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 Lumbar radiculopathy 08/12/2019  ? Drug-induced constipation 08/12/2019  ? Trigger finger 07/01/2019  ? Chronic right SI joint pain 06/05/2017  ? Osteoarthritis of facet joint of lumbar spine 06/05/2017  ? Prediabetes 05/27/2016  ? Hyperlipemia 05/27/2016  ? Osteopenia 05/27/2016  ? Allergic rhinitis 11/03/2014  ? Osteoarthritis 06/06/2011  ? Back pain 07/02/2006  ? Insomnia 07/02/2006  ? ? ?Past Medical History:  ?Diagnosis Date  ? Bradycardia   ? previously been by cardiology-- dr Rayann Heman in 2011 and dr Marlou Porch,  Cassell Clement in epic 02-06-2010;  event monitor 03-07-2020 SR average HR 53 w/2 brief PAT and rare PVC pt is asymptomatic  ? Chronic back pain   ? DDD (degenerative disc disease), lumbosacral   ? Endometrial polyp   ? Heart murmur   ? echo in epic 01-30-2020  ef 60-65%, G1DD, mild MR/ TR/ PR  no valve stenosis  ? Hypertension   ? OA (osteoarthritis)   ? PMB (postmenopausal bleeding)   ? Wears glasses   ? ? ?Past Surgical History:  ?Procedure Laterality Date  ? DILATATION & CURETTAGE/HYSTEROSCOPY WITH MYOSURE N/A 05/27/2021  ? Procedure: DILATATION & CURETTAGE/HYSTEROSCOPY WITH MYOSURE;  Surgeon: Nunzio Cobbs, MD;  Location: [facility];  Service: Gynecology;  Laterality: N/A;  ? TUBAL LIGATION Bilateral   ? 1970s  ? ? ?Current Outpatient Medications  ?Medication Sig Dispense Refill  ? Acetaminophen-Codeine 300-30 MG tablet Take two tablets by mouth every 8 hours as needed for back pain (Patient taking differently: Take 2 tablets by mouth every 8 (eight) hours as needed. Take two tablets by mouth every 8 hours as needed for back pain) 180 tablet 0  ? atorvastatin (LIPITOR) 40 MG tablet Take 1 tablet (40 mg total) by mouth daily. TAKE 1 TABLET(40 MG) BY  MOUTH DAILY Strength: 40 mg (Patient taking differently: Take 40 mg by mouth daily. TAKE 1 TABLET(40 MG) BY MOUTH DAILY Strength: 40 mg) 90 tablet 0  ? calcium-vitamin D (OSCAL WITH D) 500-200 MG-UNIT tablet Take 2 tablets by mouth daily with breakfast. 60 tablet 1  ? cetirizine (ZYRTEC) 10 MG tablet Take 1 tablet (10 mg total) by mouth daily. 30 tablet 11  ? gabapentin (NEURONTIN) 100 MG capsule TAKE 4 CAPSULES BY MOUTH EVERY MORNING, AT LUNCH, AND AT BEDTIME (Patient taking differently: 400 mg 3 (three) times daily. TAKE 4 CAPSULES BY MOUTH EVERY MORNING, AT LUNCH, AND AT BEDTIME) 360 capsule 2  ? hydrocortisone 2.5 % ointment Apply topically 2 (two) times daily. 30 g 0  ? ibuprofen (ADVIL) 600 MG tablet Take 1 tablet (600 mg total) by mouth every 6  (six) hours as needed. 30 tablet 0  ? ?No current facility-administered medications for this visit.  ?  ? ?ALLERGIES: Tramadol ? ?Family History  ?Problem Relation Age of Onset  ? Breast cancer Sister   ? ? ?Social History  ? ?Socioeconomic History  ? Marital status: Divorced  ?  Spouse name: Not on file  ? Number of children: Not on file  ? Years of education: 16  ? Highest education level: High school graduate  ?Occupational History  ? Occupation: Retired   ?  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 Smoking status: Never  ?  Passive exposure: Never  ? Smokeless tobacco: Never  ?Vaping Use  ? Vaping Use: Never used  ?Substance and Sexual Activity  ? Alcohol use: No  ?  Alcohol/week: 0.0 standard drinks  ? Drug use: Never  ? Sexual activity: Not on file  ?Other Topics Concern  ? Not on file  ?Social History Narrative  ? Patient lives alone here in [place].   ? Patient still drives herself to run errands and medical apts.   ? Patient is close with her daughter Lenna Sciara.   ? Aerobics 3-4 times per week for exercise.   ? Patient enjoys reading and cooking.   ? ?Social Determinants of Health  ? ?Financial Resource Strain: Low Risk   ? Difficulty of Paying Living Expenses: Not hard at all  ?Food Insecurity: No Food Insecurity  ? Worried About Charity fundraiser in the Last Year: Never true  ? Ran Out of Food in the Last Year: Never true  ?Transportation Needs: No Transportation Needs  ? Lack of Transportation (Medical): No  ? Lack of Transportation (Non-Medical): No  ?Physical Activity: Sufficiently Active  ? Days of Exercise per Week: 3 days  ? Minutes of Exercise per Session: 60 min  ?Stress: No Stress Concern Present  ? Feeling of Stress : Only a little  ?Social Connections: Moderately Integrated  ? Frequency of Communication with Friends and Family: More than three times a week  ? Frequency of Social Gatherings with Friends and Family: More than three times a week  ? Attends Religious Services: More than 4  times per year  ? Active Member of Clubs or Organizations: Yes  ? Attends Archivist Meetings: More than 4 times per year  ? Marital Status: Divorced  ?Intimate Partner Violence: Not At Risk  ? Fear of Current or Ex-Partner: No  ? Emotionally Abused: No  ? Physically Abused: No  ? Sexually Abused: No  ? ? ?Review of Systems  ?All other systems reviewed and are negative. ? ?PHYSICAL EXAMINATION:   ? ?  BP 118/64 (BP Location: Right Arm, Patient Position: Sitting, Cuff Size: Normal)   Pulse 76   Resp 12   Ht '5\' 8"'$  (1.727 m)   Wt 134 lb (60.8 kg)   BMI 20.37 kg/m?     ?General appearance: alert, cooperative and appears stated age ? ?Pelvic: External genitalia:  no lesions ?             Urethra:  normal appearing urethra with no masses, tenderness or lesions ?             Bartholins and Skenes: normal    ?             Vagina: normal appearing vagina with normal color and discharge, no lesions ?             Cervix: no lesions ?               ?Bimanual Exam:  Uterus:  normal size, contour, position, consistency, mobility, non-tender ?             Adnexa: no mass, fullness, tenderness ?        ?Chaperone was present for exam:  Raquel Sarna, RN ? ?ASSESSMENT ? ?Status post hysteroscopy with dilation and curettage and Myosure resection of benign endometrial polyp. ?Hx recent LGSIL noted on endometrial biopsy.  Colposcopy showed LGSIL and koilocytic atypia.  ?Abnormal EKG.  ? ?PLAN ? ?Surgical findings, procedure and pathology report reviewed.  ?Needs a pap in 12 months.  ?Will send EKG to patient's cardiologist for review.  ?FU here in March, 2024 for next cervical cancer screening.  ?  ?An After Visit Summary was printed and given to the patient. ? ?  ? ? ?  ? ?

## 2021-06-11 ENCOUNTER — Ambulatory Visit (INDEPENDENT_AMBULATORY_CARE_PROVIDER_SITE_OTHER): Payer: Medicare Other | Admitting: Obstetrics and Gynecology

## 2021-06-11 ENCOUNTER — Other Ambulatory Visit: Payer: Self-pay | Admitting: Family Medicine

## 2021-06-11 ENCOUNTER — Telehealth: Payer: Self-pay | Admitting: Obstetrics and Gynecology

## 2021-06-11 ENCOUNTER — Encounter: Payer: Self-pay | Admitting: Obstetrics and Gynecology

## 2021-06-11 VITALS — BP 118/64 | HR 76 | Resp 12 | Ht 68.0 in | Wt 134.0 lb

## 2021-06-11 DIAGNOSIS — Z9889 Other specified postprocedural states: Secondary | ICD-10-CM

## 2021-06-11 DIAGNOSIS — R9431 Abnormal electrocardiogram [ECG] [EKG]: Secondary | ICD-10-CM

## 2021-06-11 DIAGNOSIS — N870 Mild cervical dysplasia: Secondary | ICD-10-CM

## 2021-06-11 MED ORDER — ACETAMINOPHEN-CODEINE 300-30 MG PO TABS: ORAL_TABLET | ORAL | 0 refills | Status: DC

## 2021-06-11 NOTE — Telephone Encounter (Signed)
Please send copy of EKG from 05/23/21 to patient's cardiologist, Dr. Marlou Porch, for review. ? ?There were changes noted consistent with bradycardia and possible prior myocardial infarction.  ? ?I would like them to compare with the patient's prior EKG and determine if she needs anything additional done.  ?

## 2021-06-11 NOTE — Addendum Note (Signed)
Addended byDorcas Mcmurray L on: 06/11/2021 02:49 PM ? ? Modules accepted: Orders ? ?

## 2021-06-11 NOTE — Telephone Encounter (Signed)
Dr Nori Riis can you please refill this patient's medication?  I do not see her for this and I'm not sure why the refill keeps coming to me. ? ?Thanks ?Lavella Lemons

## 2021-06-11 NOTE — Telephone Encounter (Signed)
Dr Nori Riis sees patient at [facility] and prescribes this medication.  Please forward request to her. ? ?Thank tou

## 2021-06-12 NOTE — Telephone Encounter (Signed)
Please call patient with Dr. Kingsley Plan assessment regarding the patient's preop EKG. ? ?No further heart evaluation is needed. ?Her EKG is unchanged from her prior EKG.  ? ?

## 2021-06-12 NOTE — Telephone Encounter (Signed)
FYI. Per Dr. Marlou Porch: ?"Reviewed EKG.  No need for any further cardiac work-up.  Often we can see poor R wave progression in the precordial leads.  Sometimes this is lead placement.  Most recent echocardiogram during similar EKG was reassuring."  ?

## 2021-06-13 NOTE — Telephone Encounter (Signed)
FYI. Pt notified and voiced understanding.  

## 2021-06-13 NOTE — Telephone Encounter (Signed)
Encounter reviewed and closed.  

## 2021-06-13 NOTE — Telephone Encounter (Signed)
Left message for patient to call.

## 2021-07-02 ENCOUNTER — Encounter: Payer: Self-pay | Admitting: *Deleted

## 2021-07-06 ENCOUNTER — Other Ambulatory Visit: Payer: Self-pay | Admitting: Family Medicine

## 2021-07-08 ENCOUNTER — Other Ambulatory Visit: Payer: Self-pay | Admitting: Family Medicine

## 2021-07-09 ENCOUNTER — Other Ambulatory Visit: Payer: Self-pay | Admitting: Family Medicine

## 2021-07-10 NOTE — Telephone Encounter (Signed)
Can you please refill medication?

## 2021-07-11 DIAGNOSIS — H18413 Arcus senilis, bilateral: Secondary | ICD-10-CM | POA: Diagnosis not present

## 2021-07-11 DIAGNOSIS — H25043 Posterior subcapsular polar age-related cataract, bilateral: Secondary | ICD-10-CM | POA: Diagnosis not present

## 2021-07-11 DIAGNOSIS — H2513 Age-related nuclear cataract, bilateral: Secondary | ICD-10-CM | POA: Diagnosis not present

## 2021-07-11 DIAGNOSIS — H2512 Age-related nuclear cataract, left eye: Secondary | ICD-10-CM | POA: Diagnosis not present

## 2021-07-11 DIAGNOSIS — H25013 Cortical age-related cataract, bilateral: Secondary | ICD-10-CM | POA: Diagnosis not present

## 2021-07-12 MED ORDER — ACETAMINOPHEN-CODEINE 300-30 MG PO TABS: ORAL_TABLET | ORAL | 0 refills | Status: DC

## 2021-07-12 NOTE — Addendum Note (Signed)
Addended byDorcas Mcmurray L on: 07/12/2021 03:23 PM   Modules accepted: Orders

## 2021-07-17 IMAGING — MR MR LUMBAR SPINE W/O CM
4 of 5 series · 26 of 48 positions shown · non-contrast
Comparison: MRI lumbar spine 12/23/2015

CLINICAL DATA: Chronic low back pain with worsening left leg pain
over the past year. No specific injury. No history of surgery.

EXAM:
MRI LUMBAR SPINE WITHOUT CONTRAST
TECHNIQUE: Multiplanar, multisequence MR imaging of the lumbar spine was
performed. No intravenous contrast was administered.

[Series 3: T2 · sagittal · 4.0mm · 1.09mm/px · 6 of 16 slices shown (1 of 2)]
[im 1/16]
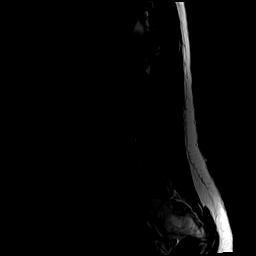
[im 4/16]
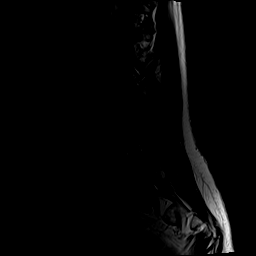
[im 7/16]
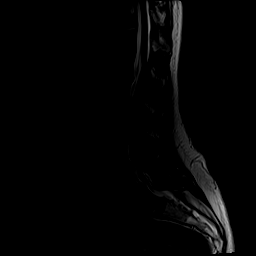
[im 10/16]
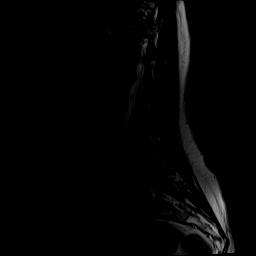
[im 13/16]
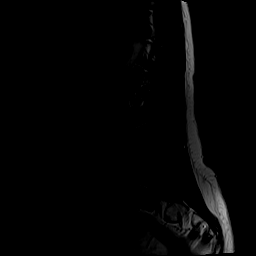
[im 16/16]
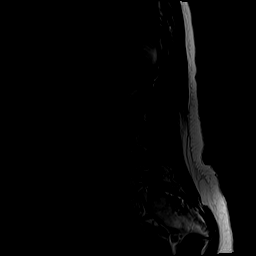

[Series 5: T1 · sagittal · 4.0mm · 1.09mm/px · 6 of 16 slices shown (1 of 2)]
[im 1/16]
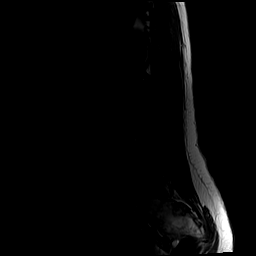
[im 4/16]
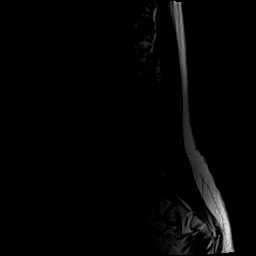
[im 7/16]
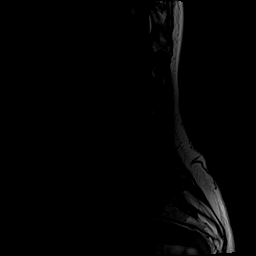
[im 10/16]
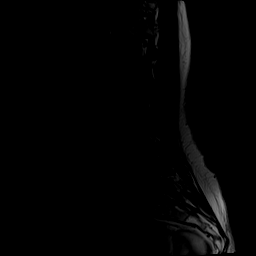
[im 13/16]
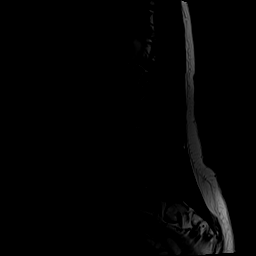
[im 16/16]
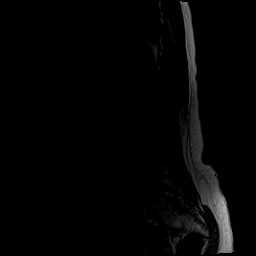

[Series 6: T2 · axial · 4.0mm · 0.39mm/px · z∈[-60,+163]mm · 9 of 40 slices shown (2 of 2)]
[im 1/40]
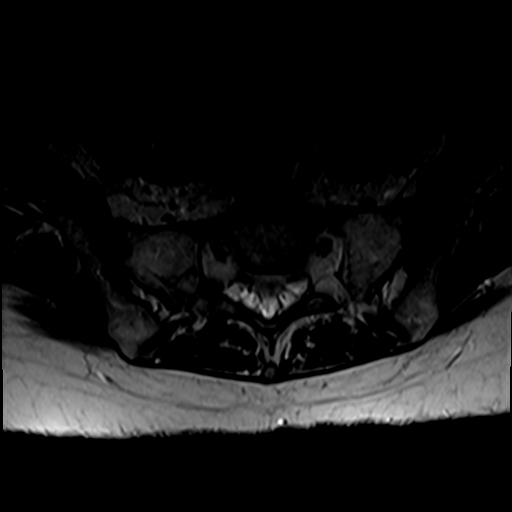
[im 6/40]
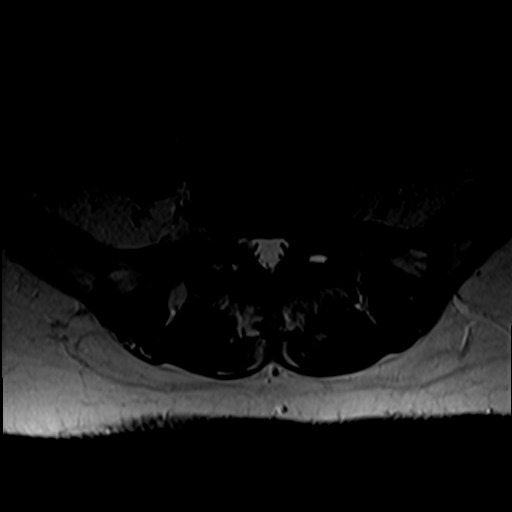
[im 12/40]
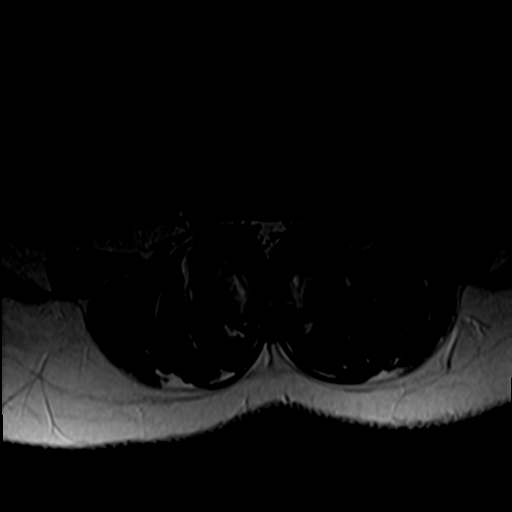
[im 17/40]
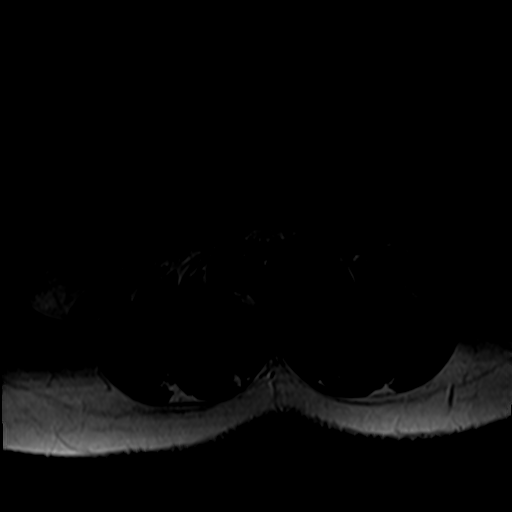
[im 20/40]
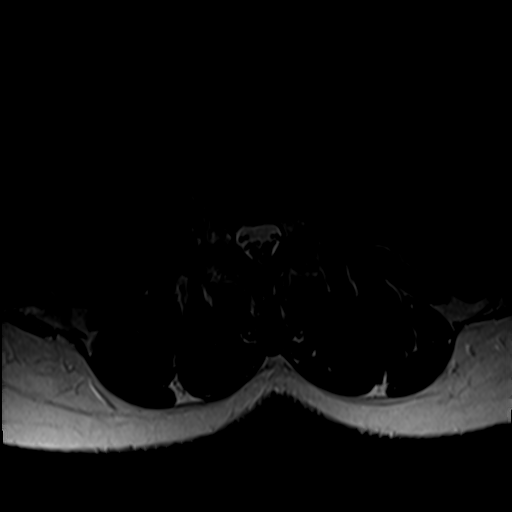
[im 23/40]
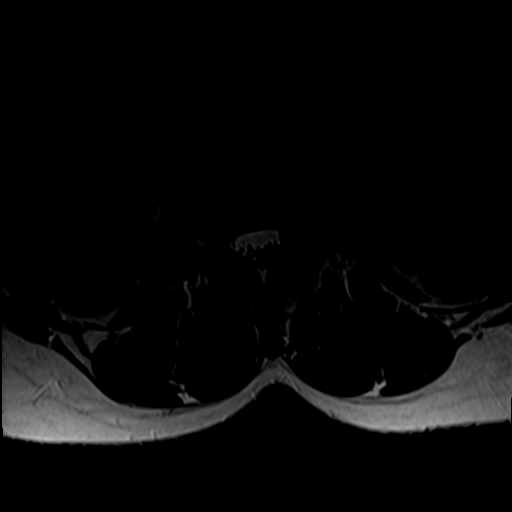
[im 28/40]
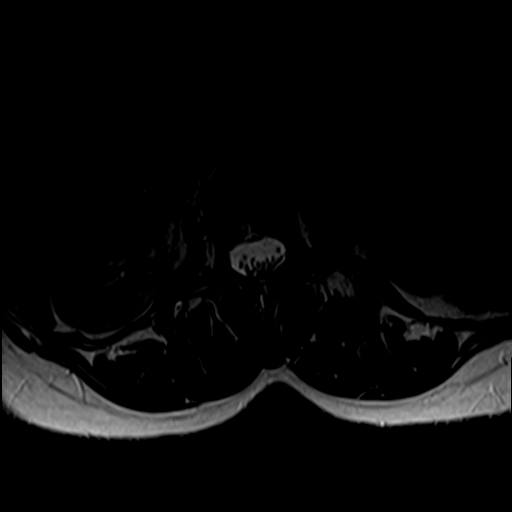
[im 34/40]
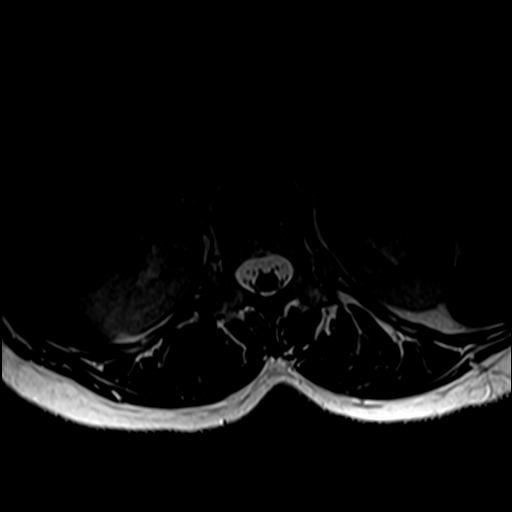
[im 40/40]
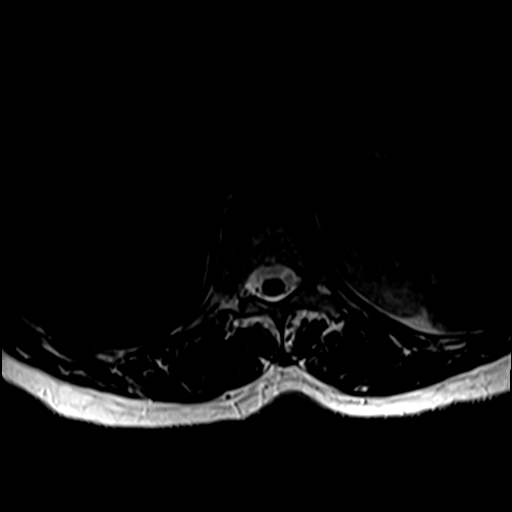

[Series 7: T1 · axial · 4.0mm · 0.39mm/px · z∈[-60,+134]mm · 5 of 40 slices shown (2 of 2)]
[im 1/40]
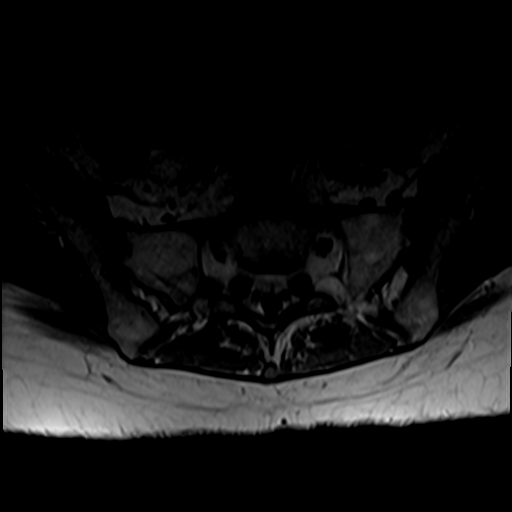
[im 6/40]
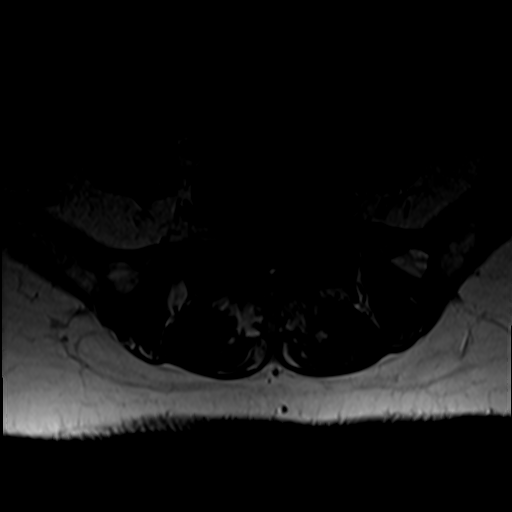
[im 12/40]
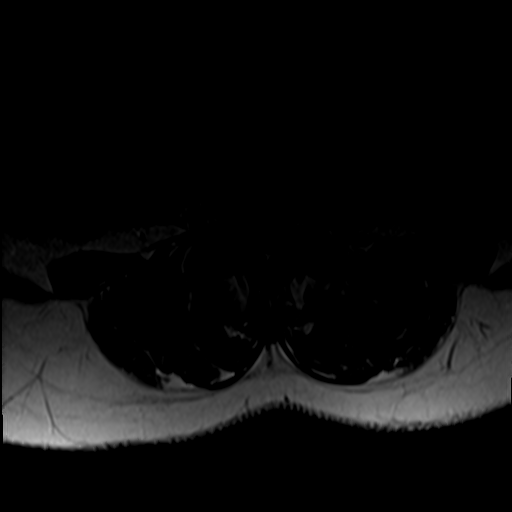
[im 20/40]
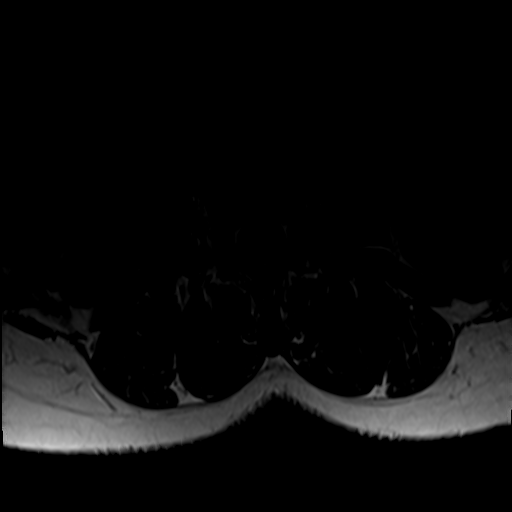
[im 34/40]
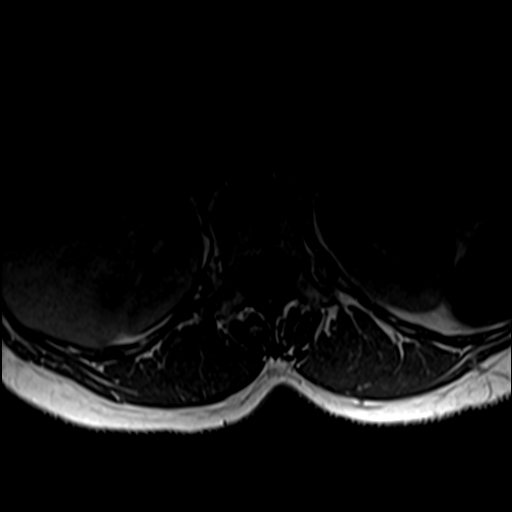

[26 of 48 positions shown; findings below may reference images not displayed]

FINDINGS: Segmentation: There are five lumbar type vertebral bodies. The last
full intervertebral disc space is labeled L5-S1. This correlates
with the prior study.

Alignment: Mild degenerative anterolisthesis of L4 and L5 due to
facet disease. No definite pars defects.

Vertebrae:  Normal marrow signal.  No bone lesions or fractures.

Conus medullaris and cauda equina: Conus extends to the L1-2 level.
Conus and cauda equina appear normal.

Paraspinal and other soft tissues: No significant paraspinal or
retroperitoneal findings.

Disc levels:

L1-2: Mild bulging annulus and osteophytic ridging with mild
bilateral lateral recess encroachment. No significant spinal or
foraminal stenosis.

L2-3: Mild annular bulge and mild facet disease. There is mild
lateral recess encroachment bilaterally which appears stable. No
significant spinal or foraminal stenosis.

L3-4: Bulging degenerated annulus, shallow broad-based disc
protrusion, osteophytic ridging and facet disease contributing to
mild spinal stenosis and moderate bilateral lateral recess stenosis
possibly impinging on both L4 nerve roots. No significant foraminal
stenosis.

L4-5: Bulging uncovered disc and moderate facet disease with
ligamentum flavum thickening contributing to early spinal stenosis
and moderate bilateral lateral recess stenosis. There is also mild
bilateral foraminal encroachment.

L5-S1: Bulging uncovered disc and severe facet disease, right
greater than left. No significant spinal stenosis. There is mild
lateral recess encroachment bilaterally which could potentially
irritate the S1 nerve roots. No significant foraminal stenosis. Mild
foraminal encroachment bilaterally.
IMPRESSION: 1. Mild bilateral lateral recess encroachment at L1-2 and L2-3.
2. Multifactorial mild spinal stenosis and moderate bilateral
lateral recess stenosis at L3-4.
3. Early spinal stenosis and moderate bilateral lateral recess
stenosis at L4-5. There is also mild bilateral foraminal
encroachment.
4. Severe facet disease at L5-S1 along with a bulging uncovered disc
contributing to mild bilateral lateral recess stenosis and mild
bilateral foraminal encroachment.

## 2021-08-05 ENCOUNTER — Other Ambulatory Visit: Payer: Self-pay | Admitting: Family Medicine

## 2021-08-06 ENCOUNTER — Other Ambulatory Visit: Payer: Self-pay | Admitting: Family Medicine

## 2021-08-07 ENCOUNTER — Telehealth: Payer: Self-pay

## 2021-08-08 MED ORDER — ACETAMINOPHEN-CODEINE 300-30 MG PO TABS: ORAL_TABLET | ORAL | 0 refills | Status: DC

## 2021-08-08 NOTE — Telephone Encounter (Signed)
refilled 

## 2021-08-08 NOTE — Telephone Encounter (Signed)
Patient called stating that her gabapentin was increased when she last saw Dr. Nori Riis at [facility] in Jan/February.  She has since requested a refill and it was sent to the PCP who wrote for the old dose of 1 tab gabapentin 3x a day.  Patient is going to call [facility] for an appt to follow up.    I called patient back and LVM explaining that medication is for the same dosage of '400mg'$  3x a day but she only needs to take 1 pill for that amount not 4 caps anymore.  Asked patient to call me back so I can explain to her better.    Alira Fretwell,CMA

## 2021-09-10 ENCOUNTER — Other Ambulatory Visit: Payer: Self-pay

## 2021-09-13 ENCOUNTER — Telehealth: Payer: Self-pay | Admitting: *Deleted

## 2021-09-13 MED ORDER — ACETAMINOPHEN-CODEINE 300-30 MG PO TABS: ORAL_TABLET | ORAL | 0 refills | Status: DC

## 2021-09-13 NOTE — Telephone Encounter (Signed)
refilled 

## 2021-09-19 DIAGNOSIS — M5030 Other cervical disc degeneration, unspecified cervical region: Secondary | ICD-10-CM | POA: Diagnosis not present

## 2021-09-21 ENCOUNTER — Other Ambulatory Visit: Payer: Self-pay | Admitting: *Deleted

## 2021-09-27 HISTORY — PX: CATARACT EXTRACTION, BILATERAL: SHX1313

## 2021-10-03 DIAGNOSIS — M9902 Segmental and somatic dysfunction of thoracic region: Secondary | ICD-10-CM | POA: Diagnosis not present

## 2021-10-03 DIAGNOSIS — S29012A Strain of muscle and tendon of back wall of thorax, initial encounter: Secondary | ICD-10-CM | POA: Diagnosis not present

## 2021-10-03 DIAGNOSIS — M5032 Other cervical disc degeneration, mid-cervical region, unspecified level: Secondary | ICD-10-CM | POA: Diagnosis not present

## 2021-10-03 DIAGNOSIS — M9901 Segmental and somatic dysfunction of cervical region: Secondary | ICD-10-CM | POA: Diagnosis not present

## 2021-10-04 DIAGNOSIS — M5032 Other cervical disc degeneration, mid-cervical region, unspecified level: Secondary | ICD-10-CM | POA: Diagnosis not present

## 2021-10-04 DIAGNOSIS — M9901 Segmental and somatic dysfunction of cervical region: Secondary | ICD-10-CM | POA: Diagnosis not present

## 2021-10-04 DIAGNOSIS — S29012A Strain of muscle and tendon of back wall of thorax, initial encounter: Secondary | ICD-10-CM | POA: Diagnosis not present

## 2021-10-04 DIAGNOSIS — M9902 Segmental and somatic dysfunction of thoracic region: Secondary | ICD-10-CM | POA: Diagnosis not present

## 2021-10-09 DIAGNOSIS — M5032 Other cervical disc degeneration, mid-cervical region, unspecified level: Secondary | ICD-10-CM | POA: Diagnosis not present

## 2021-10-09 DIAGNOSIS — M9901 Segmental and somatic dysfunction of cervical region: Secondary | ICD-10-CM | POA: Diagnosis not present

## 2021-10-09 DIAGNOSIS — S29012A Strain of muscle and tendon of back wall of thorax, initial encounter: Secondary | ICD-10-CM | POA: Diagnosis not present

## 2021-10-09 DIAGNOSIS — M9902 Segmental and somatic dysfunction of thoracic region: Secondary | ICD-10-CM | POA: Diagnosis not present

## 2021-10-11 ENCOUNTER — Ambulatory Visit
Admission: RE | Admit: 2021-10-11 | Discharge: 2021-10-11 | Disposition: A | Discharge status: Home / Self Care | Payer: Medicare Other | Source: Ambulatory Visit | Attending: Family Medicine | Admitting: Family Medicine

## 2021-10-11 ENCOUNTER — Ambulatory Visit (INDEPENDENT_AMBULATORY_CARE_PROVIDER_SITE_OTHER): Payer: Medicare Other | Admitting: Family Medicine

## 2021-10-11 VITALS — BP 110/60

## 2021-10-11 DIAGNOSIS — G8929 Other chronic pain: Secondary | ICD-10-CM | POA: Diagnosis not present

## 2021-10-11 DIAGNOSIS — M545 Low back pain, unspecified: Secondary | ICD-10-CM | POA: Diagnosis not present

## 2021-10-11 DIAGNOSIS — M5442 Lumbago with sciatica, left side: Secondary | ICD-10-CM | POA: Diagnosis not present

## 2021-10-11 MED ORDER — ACETAMINOPHEN-CODEINE 300-30 MG PO TABS: ORAL_TABLET | ORAL | 0 refills | Status: DC

## 2021-10-11 MED ORDER — GABAPENTIN 400 MG PO CAPS: ORAL_CAPSULE | ORAL | 3 refills | Status: DC

## 2021-10-11 NOTE — Patient Instructions (Signed)
I would recommend at least 2 or 3 more massages to get the neck muscles back in shape. Over the counter Aspercreame can also be useful  I sent in your refills and ordered your x ray. Great to see you!

## 2021-10-13 NOTE — Progress Notes (Signed)
  Kimberly Guerrero - 72 y.o. female MRN [id number]  Date of birth: March 09, 1950    SUBJECTIVE:      Chief Complaint:/ HPI:  #1 low back pain chronic: Wants to discuss any additional options.  The pain medicine is helping but she still has significant pain.  She went to get an x-ray because she may want to pursue chiropractic care. 2.  Recent muscle spasm in the right neck/shoulder muscles.  She was seen by outside providers including chiropractor without much relief until she got a massage which helped to 99%.  Wants to discuss this.    OBJECTIVE: BP 110/60   Physical Exam:  Vital signs are reviewed. GENERAL: Well-developed female no acute distress NECK: Still some significant muscle tightness in the right trapezius right posterior scalenes this is the area where she had pain.  She does have full range of motion of the neck.  Negative Spurling's.  Upper extremity strength is 5 out of 5.  ASSESSMENT & PLAN:  #1.  Chronic low back pain: I reviewed her previous films with her.  I would be wary of any high velocity treatments on her low back or neck and we have discussed this.  We will get new films. 2.  Neck muscle spasm: She does a lot of crocheting and admits she may have been doing extra recently.  That would probably place her arm in unusual position for long periods of time causing right neck muscle spasm.  I recommended she pursue the massage for at least another 2 and then potentially consider it monthly.  Should she have another acute flare, I advised her to come back and we would possibly consider trigger point injection.  She was not very fond of that idea but admitted to consider it.  Refilled her chronic medications. See problem based charting & AVS for pt instructions. No problem-specific Assessment & Plan notes found for this encounter.

## 2021-10-14 ENCOUNTER — Encounter: Payer: Self-pay | Admitting: Family Medicine

## 2021-10-16 DIAGNOSIS — M5032 Other cervical disc degeneration, mid-cervical region, unspecified level: Secondary | ICD-10-CM | POA: Diagnosis not present

## 2021-10-16 DIAGNOSIS — S29012A Strain of muscle and tendon of back wall of thorax, initial encounter: Secondary | ICD-10-CM | POA: Diagnosis not present

## 2021-10-16 DIAGNOSIS — M9903 Segmental and somatic dysfunction of lumbar region: Secondary | ICD-10-CM | POA: Diagnosis not present

## 2021-10-16 DIAGNOSIS — M5136 Other intervertebral disc degeneration, lumbar region: Secondary | ICD-10-CM | POA: Diagnosis not present

## 2021-10-16 DIAGNOSIS — M9901 Segmental and somatic dysfunction of cervical region: Secondary | ICD-10-CM | POA: Diagnosis not present

## 2021-10-16 DIAGNOSIS — M9902 Segmental and somatic dysfunction of thoracic region: Secondary | ICD-10-CM | POA: Diagnosis not present

## 2021-10-18 ENCOUNTER — Other Ambulatory Visit: Payer: Self-pay | Admitting: Family Medicine

## 2021-10-21 DIAGNOSIS — M5032 Other cervical disc degeneration, mid-cervical region, unspecified level: Secondary | ICD-10-CM | POA: Diagnosis not present

## 2021-10-21 DIAGNOSIS — M9903 Segmental and somatic dysfunction of lumbar region: Secondary | ICD-10-CM | POA: Diagnosis not present

## 2021-10-21 DIAGNOSIS — M5136 Other intervertebral disc degeneration, lumbar region: Secondary | ICD-10-CM | POA: Diagnosis not present

## 2021-10-21 DIAGNOSIS — M9901 Segmental and somatic dysfunction of cervical region: Secondary | ICD-10-CM | POA: Diagnosis not present

## 2021-10-21 DIAGNOSIS — S29012A Strain of muscle and tendon of back wall of thorax, initial encounter: Secondary | ICD-10-CM | POA: Diagnosis not present

## 2021-10-21 DIAGNOSIS — M9902 Segmental and somatic dysfunction of thoracic region: Secondary | ICD-10-CM | POA: Diagnosis not present

## 2021-10-23 DIAGNOSIS — H2512 Age-related nuclear cataract, left eye: Secondary | ICD-10-CM | POA: Diagnosis not present

## 2021-10-24 DIAGNOSIS — H2511 Age-related nuclear cataract, right eye: Secondary | ICD-10-CM | POA: Diagnosis not present

## 2021-10-24 DIAGNOSIS — H2512 Age-related nuclear cataract, left eye: Secondary | ICD-10-CM | POA: Diagnosis not present

## 2021-10-24 DIAGNOSIS — H25011 Cortical age-related cataract, right eye: Secondary | ICD-10-CM | POA: Diagnosis not present

## 2021-10-24 DIAGNOSIS — H25041 Posterior subcapsular polar age-related cataract, right eye: Secondary | ICD-10-CM | POA: Diagnosis not present

## 2021-10-31 DIAGNOSIS — S29012A Strain of muscle and tendon of back wall of thorax, initial encounter: Secondary | ICD-10-CM | POA: Diagnosis not present

## 2021-10-31 DIAGNOSIS — M5136 Other intervertebral disc degeneration, lumbar region: Secondary | ICD-10-CM | POA: Diagnosis not present

## 2021-10-31 DIAGNOSIS — M9903 Segmental and somatic dysfunction of lumbar region: Secondary | ICD-10-CM | POA: Diagnosis not present

## 2021-10-31 DIAGNOSIS — M9902 Segmental and somatic dysfunction of thoracic region: Secondary | ICD-10-CM | POA: Diagnosis not present

## 2021-10-31 DIAGNOSIS — M9901 Segmental and somatic dysfunction of cervical region: Secondary | ICD-10-CM | POA: Diagnosis not present

## 2021-10-31 DIAGNOSIS — M5032 Other cervical disc degeneration, mid-cervical region, unspecified level: Secondary | ICD-10-CM | POA: Diagnosis not present

## 2021-11-04 DIAGNOSIS — M9901 Segmental and somatic dysfunction of cervical region: Secondary | ICD-10-CM | POA: Diagnosis not present

## 2021-11-04 DIAGNOSIS — M9903 Segmental and somatic dysfunction of lumbar region: Secondary | ICD-10-CM | POA: Diagnosis not present

## 2021-11-04 DIAGNOSIS — M5032 Other cervical disc degeneration, mid-cervical region, unspecified level: Secondary | ICD-10-CM | POA: Diagnosis not present

## 2021-11-04 DIAGNOSIS — M9902 Segmental and somatic dysfunction of thoracic region: Secondary | ICD-10-CM | POA: Diagnosis not present

## 2021-11-04 DIAGNOSIS — M5136 Other intervertebral disc degeneration, lumbar region: Secondary | ICD-10-CM | POA: Diagnosis not present

## 2021-11-04 DIAGNOSIS — S29012A Strain of muscle and tendon of back wall of thorax, initial encounter: Secondary | ICD-10-CM | POA: Diagnosis not present

## 2021-11-06 DIAGNOSIS — H2511 Age-related nuclear cataract, right eye: Secondary | ICD-10-CM | POA: Diagnosis not present

## 2021-11-09 ENCOUNTER — Other Ambulatory Visit: Payer: Self-pay | Admitting: Family Medicine

## 2021-11-28 ENCOUNTER — Other Ambulatory Visit: Payer: Self-pay | Admitting: Family Medicine

## 2021-11-28 DIAGNOSIS — Z1231 Encounter for screening mammogram for malignant neoplasm of breast: Secondary | ICD-10-CM

## 2021-12-11 ENCOUNTER — Other Ambulatory Visit: Payer: Self-pay | Admitting: Family Medicine

## 2022-01-07 ENCOUNTER — Other Ambulatory Visit: Payer: Self-pay | Admitting: Family Medicine

## 2022-01-28 ENCOUNTER — Ambulatory Visit
Admission: RE | Admit: 2022-01-28 | Discharge: 2022-01-28 | Disposition: A | Discharge status: Home / Self Care | Payer: Medicare Other | Source: Ambulatory Visit | Attending: Family Medicine | Admitting: Family Medicine

## 2022-01-28 DIAGNOSIS — Z1231 Encounter for screening mammogram for malignant neoplasm of breast: Secondary | ICD-10-CM | POA: Diagnosis not present

## 2022-02-14 ENCOUNTER — Encounter: Payer: Self-pay | Admitting: Family Medicine

## 2022-02-14 ENCOUNTER — Ambulatory Visit (INDEPENDENT_AMBULATORY_CARE_PROVIDER_SITE_OTHER): Payer: 59 | Admitting: Family Medicine

## 2022-02-14 VITALS — BP 118/70 | Ht 69.0 in | Wt 142.0 lb

## 2022-02-14 DIAGNOSIS — M48062 Spinal stenosis, lumbar region with neurogenic claudication: Secondary | ICD-10-CM

## 2022-02-14 DIAGNOSIS — Z9849 Cataract extraction status, unspecified eye: Secondary | ICD-10-CM

## 2022-02-14 DIAGNOSIS — N950 Postmenopausal bleeding: Secondary | ICD-10-CM

## 2022-02-14 HISTORY — DX: Cataract extraction status, unspecified eye: Z98.49

## 2022-02-14 MED ORDER — CYCLOBENZAPRINE HCL 10 MG PO TABS: 10.0000 mg | ORAL_TABLET | Freq: Two times a day (BID) | ORAL | 5 refills | Status: DC | PRN

## 2022-02-14 MED ORDER — ACETAMINOPHEN-CODEINE 300-30 MG PO TABS: ORAL_TABLET | ORAL | 0 refills | Status: DC

## 2022-02-14 MED ORDER — MELOXICAM 7.5 MG PO TABS: 7.5000 mg | ORAL_TABLET | Freq: Every day | ORAL | 3 refills | Status: DC

## 2022-02-14 NOTE — Patient Instructions (Signed)
I am glad your neck is better. I am also glad the meloxicam and cyclobenzaprine are helping.  Great to see you!

## 2022-02-14 NOTE — Assessment & Plan Note (Signed)
Much improved Using reading glasses

## 2022-02-14 NOTE — Progress Notes (Signed)
  Kimberly Guerrero - 72 y.o. female MRN [id number]  Date of birth: 1950/08/09    SUBJECTIVE:      Chief Complaint:/ HPI:   1. F/u chronic back pain. Seems stable. She discontinued gabapentin as it was not seamimng to help. Another doctor gave her some meloxicam and some cyclobenzaprine and those have been very helpful. Combination of those with her tylenol #3 keeps pain tolerable. 2. Sw a chiropracter and had some manipulation and her neck issues are essentially resolved. 3.Had B cataract surgery and that has helped. Still needs glasses for up close reading. 4. Had GYN procedure and has upcoming pap smear repeat in March. Polyp removed was without any malignancy.   OBJECTIVE: BP 118/70   Ht '5\' 9"'$  (1.753 m)   Wt 142 lb (64.4 kg)   BMI 20.97 kg/m   Physical Exam:  Vital signs are reviewed. GEN WD WN NAD BACK: some chronic lumbar muscle spasm. Some limit in forward flexion and hyperextension related to this spasm and she appears a but stiff, but able to get on and off exam table without assistance.  GAIT: slightly antalgic NECK FROM  ASSESSMENT & PLAN:   Neck pain: resolved after chiropractic treatments   See problem based charting & AVS for pt instructions. Spinal stenosis, lumbar region, with neurogenic claudication GFR > 60 Plan recheck next 6 months if continuing meloxicam Seems stable. We will d/c gabapentin and continue with cyclobezaprine and meloxicam as well as tylenol with codeine. This seems to be working well for her. Discussed risks of GI bleeding with chronic NSAID use and she is aware of red flags. She is also aware not to take OTC NSAIDs in addition.  Should she start to develop new or worsening sx, it might be time to update her MRI as the next most likely step would be surgery. We discussed at length. As long as sx remain stable, she would prefer to avoid surgical intervention.  Postmenopausal vaginal bleeding Had benign endometiral polyp removed. Follow up with GYN  in March 2024. No vaginal bleeding since procedure.  Initial endometrial biopsy: ENDOCERVICAL, CURETTAGE:  - Low-grade squamous intraepithelial lesion (CIN1, low grade dysplasia)  - Benign cervical glandular mucosa   Subsequent polypectomy by GYN: A. ENDOMETRIUM, POLYPECTOMY:  - Fragments of endometrial polyp and inactive endometrium.  - No atypia or malignancy.   B. ENDOMETRIUM, CURETTAGE:  - Inactive endometrium.  - No atypia or malignancy.   B. EXOCERVIX, 6 O'CLOCK, BIOPSY:  - Very focal koilocytic atypia, suggestive of low-grade squamous  intraepithelial lesion (CIN1, low-grade dysplasia)     Status post cataract extraction Much improved Using reading glasses

## 2022-02-14 NOTE — Assessment & Plan Note (Addendum)
GFR > 60 Plan recheck next 6 months if continuing meloxicam Seems stable. We will d/c gabapentin and continue with cyclobezaprine and meloxicam as well as tylenol with codeine. This seems to be working well for her. Discussed risks of GI bleeding with chronic NSAID use and she is aware of red flags. She is also aware not to take OTC NSAIDs in addition.  Should she start to develop new or worsening sx, it might be time to update her MRI as the next most likely step would be surgery. We discussed at length. As long as sx remain stable, she would prefer to avoid surgical intervention.

## 2022-02-14 NOTE — Assessment & Plan Note (Addendum)
Had benign endometiral polyp removed. Follow up with GYN in March 2024. No vaginal bleeding since procedure.  Initial endometrial biopsy: ENDOCERVICAL, CURETTAGE:  - Low-grade squamous intraepithelial lesion (CIN1, low grade dysplasia)  - Benign cervical glandular mucosa   Subsequent polypectomy by GYN: A. ENDOMETRIUM, POLYPECTOMY:  - Fragments of endometrial polyp and inactive endometrium.  - No atypia or malignancy.   B. ENDOMETRIUM, CURETTAGE:  - Inactive endometrium.  - No atypia or malignancy.   B. EXOCERVIX, 6 O'CLOCK, BIOPSY:  - Very focal koilocytic atypia, suggestive of low-grade squamous  intraepithelial lesion (CIN1, low-grade dysplasia)

## 2022-03-21 ENCOUNTER — Other Ambulatory Visit: Payer: Self-pay | Admitting: Family Medicine

## 2022-03-27 NOTE — Progress Notes (Signed)
72 y.o. G1P1001 Single African American female here for breast and pelvic and pap.   Had colposcopy done last year on 04/22/21 due to her endometrial biopsy on 04/02/21 for postmenopausal bleeding showing LGSIL.  The EMB also showed an endometrial polyp.  ECC LGSIL and biopsy showed koilocytic atypia, suggestive of LGSIL.  Ultimately she had a hysteroscopy with dilation and curettage and Myosure resection of endometrial polyp on 05/27/21.  Surgical pathology showed fragment of endometrial polyp and inactive endometrium.   No further postmenopausal bleeding.   Not sexually active in the last year.  No LMP recorded. Patient is postmenopausal.           Sexually active: No.  The current method of family planning is post menopausal status/BTL.    Exercising: Yes.     Aerobics and yoga Smoker:  no  Health Maintenance: Pap:  03/01/21 neg:HR HPV neg, 04/26/15 History of abnormal Pap:  yes, colpo 04/22/21 MMG:  01/28/22 Breast Density Category B, BI-RADS CATEGORY 1 Neg Colonoscopy:  cologuard 2023 normal BMD:   09/14/20  Result  osteopenic TDaP:  unsure Gardasil:   no Screening Labs:  PCP   reports that she has never smoked. She has never been exposed to tobacco smoke. She has never used smokeless tobacco. She reports that she does not drink alcohol and does not use drugs.  Past Medical History:  Diagnosis Date   Bradycardia    previously been by cardiology-- dr Rayann Heman in 2011 and dr Marlou Porch, [facility] in epic 02-06-2010;  event monitor 03-07-2020 SR average HR 53 w/2 brief PAT and rare PVC pt is asymptomatic   Chronic back pain    DDD (degenerative disc disease), lumbosacral    Endometrial polyp    Heart murmur    echo in epic 01-30-2020  ef 60-65%, G1DD, mild MR/ TR/ PR  no valve stenosis   Hypertension    OA (osteoarthritis)    PMB (postmenopausal bleeding)    Wears glasses     Past Surgical History:  Procedure Laterality Date   DILATATION & CURETTAGE/HYSTEROSCOPY WITH MYOSURE N/A 05/27/2021    Procedure: DILATATION & CURETTAGE/HYSTEROSCOPY WITH MYOSURE;  Surgeon: Nunzio Cobbs, MD;  Location: [facility];  Service: Gynecology;  Laterality: N/A;   TUBAL LIGATION Bilateral    1970s    Current Outpatient Medications  Medication Sig Dispense Refill   acetaminophen-codeine (TYLENOL #3) 300-30 MG tablet TAKE 2 TABLETS BY MOUTH EVERY 8 HOURS AS NEEDED FOR BACK PAIN 180 tablet 0   atorvastatin (LIPITOR) 40 MG tablet Take 1 tablet (40 mg total) by mouth daily. TAKE 1 TABLET(40 MG) BY MOUTH DAILY Strength: 40 mg 90 tablet 2   calcium-vitamin D (OSCAL WITH D) 500-200 MG-UNIT tablet Take 2 tablets by mouth daily with breakfast. 60 tablet 1   cetirizine (ZYRTEC) 10 MG tablet Take 1 tablet (10 mg total) by mouth daily. 30 tablet 11   cyclobenzaprine (FLEXERIL) 10 MG tablet Take 1 tablet (10 mg total) by mouth 2 (two) times daily as needed for muscle spasms. 60 tablet 5   meloxicam (MOBIC) 7.5 MG tablet Take 1 tablet (7.5 mg total) by mouth daily. 90 tablet 3   No current facility-administered medications for this visit.    Family History  Problem Relation Age of Onset   Breast cancer Sister     Review of Systems  All other systems reviewed and are negative.   Exam:   BP 118/74 (BP Location: Left Arm, Patient Position: Sitting,  Cuff Size: Normal)   Pulse 67   Ht '5\' 8"'$  (1.727 m)   Wt 138 lb (62.6 kg)   SpO2 99%   BMI 20.98 kg/m     General appearance: alert, cooperative and appears stated age Neck: no adenopathy Breasts: normal appearance, no masses or tenderness, No nipple retraction or dimpling, No nipple discharge or bleeding, No axillary adenopathy Abdomen: soft, non-tender; no masses, no organomegaly Lymph nodes: cervical, supraclavicular, and axillary nodes normal.  Pelvic: External genitalia:  no lesions              No abnormal inguinal nodes palpated.              Urethra:  normal appearing urethra with no masses, tenderness or lesions               Bartholins and Skenes: normal                 Vagina: normal appearing vagina with normal color and discharge, no lesions              Cervix: no lesions              Pap taken: yes Bimanual Exam:  Uterus:  normal size, contour, position, consistency, mobility, non-tender              Adnexa: no mass, fullness, tenderness              Rectal exam: yes.  Confirms.              Anus:  normal sphincter tone, no lesions  Chaperone was present for exam:  Emily  Assessment:   CIN I.  Encounter for breast and pelvic exam.  History of postmenopausal bleeding and endometrial polyp. FH breast cancer in sister.   Plan: Mammogram screening discussed. Self breast awareness reviewed. Pap and HR HPV collected.  We discussed cervical dysplasia and the importance of follow up paps and HR HPV testing.  She will call for any future bleeding in her menopause.  Follow up will be determined based on her pap and HR HPV testing.   20 min  total time was spent for this patient encounter, including preparation, face-to-face counseling with the patient, coordination of care, and documentation of the encounter in addition to doing her breast and pelvic exam.   After visit summary provided.

## 2022-04-10 ENCOUNTER — Ambulatory Visit (INDEPENDENT_AMBULATORY_CARE_PROVIDER_SITE_OTHER): Payer: 59 | Admitting: Obstetrics and Gynecology

## 2022-04-10 ENCOUNTER — Encounter: Payer: Self-pay | Admitting: Obstetrics and Gynecology

## 2022-04-10 ENCOUNTER — Other Ambulatory Visit (HOSPITAL_COMMUNITY)
Admission: RE | Admit: 2022-04-10 | Discharge: 2022-04-10 | Disposition: A | Discharge status: Home / Self Care | Payer: 59 | Source: Ambulatory Visit | Attending: Obstetrics and Gynecology | Admitting: Obstetrics and Gynecology

## 2022-04-10 VITALS — BP 118/74 | HR 67 | Ht 68.0 in | Wt 138.0 lb

## 2022-04-10 DIAGNOSIS — Z1151 Encounter for screening for human papillomavirus (HPV): Secondary | ICD-10-CM | POA: Insufficient documentation

## 2022-04-10 DIAGNOSIS — Z124 Encounter for screening for malignant neoplasm of cervix: Secondary | ICD-10-CM | POA: Diagnosis not present

## 2022-04-10 DIAGNOSIS — Z9189 Other specified personal risk factors, not elsewhere classified: Secondary | ICD-10-CM

## 2022-04-10 DIAGNOSIS — Z01419 Encounter for gynecological examination (general) (routine) without abnormal findings: Secondary | ICD-10-CM

## 2022-04-10 DIAGNOSIS — N870 Mild cervical dysplasia: Secondary | ICD-10-CM | POA: Diagnosis present

## 2022-04-10 NOTE — Patient Instructions (Signed)
Cervical Dysplasia  Cervical dysplasia is a condition in which the cells in a woman's cervix have abnormal changes. The cervix is the opening of the uterus. It is located between the vagina and the uterus. Cervical dysplasia may be an early sign of cervical cancer. If left untreated, this condition may become more severe and may progress to cervical cancer. Early detection, treatment, and follow-up care are very important. What are the causes? Cervical dysplasia is usually caused by a human papillomavirus (HPV) infection. HPV is spread from person to person through sexual contact. This includes oral, vaginal, or anal sex. HPV is the most common sexually transmitted infection (STI). You are more likely to be exposed to HPV through sexual contact if: You have had more than one sexual partner or you have a sexual partner who has multiple sexual partners. You do not use a condom during sex, especially with new sexual partners. What increases the risk? The following factors may make you more likely to develop this condition: Having a family history of cervical cancer or a personal history of cancer of the vagina or vulva. Having had an STI, such as herpes, chlamydia, or gonorrhea. Becoming sexually active before age 18. Having a weakened disease-fighting system (immunesystem). Smoking. Being the daughter of a woman who took diethylstilbestrol (DES), a synthetic estrogen, during pregnancy. What are the signs or symptoms? There are usually no symptoms of this condition. If you do have symptoms, they may include: Abnormal vaginal discharge. Bleeding between periods or after sex. Bleeding during menopause. Pain during sex. How is this diagnosed? This condition may be diagnosed with a Pap test. During this test, cells are swabbed from the cervix and checked under a microscope. If the Pap test is abnormal or if the cervix looks abnormal, you may also have a test in which a tissue sample is removed from  the cervix and looked at under a microscope(biopsy). How is this treated? Treatment varies based on the severity of the condition. Treatment may include: Cryotherapy. During this therapy, the abnormal cells are frozen with a steel-tipped instrument. Loop electrosurgical excision procedure (LEEP). LEEP removes abnormal tissue from the cervix. Surgery to remove abnormal tissue. This is usually done in more severe cases. Options include: A cone biopsy. This treatment removes the cervical canal and part of the center of the cervix. Hysterectomy. This is a surgery in which the uterus and cervix are removed. Follow these instructions at home: Take over-the-counter and prescription medicines only as told by your health care provider. Do not use tampons, have sex, or douche until your health care provider says it is safe. Keep all follow-up visits. This is important. Women who have been treated for cervical dysplasia should have regular pelvic exams and Pap tests. How is this prevented? Practice safe sex to help prevent STIs. Have regular Pap tests. Talk with your health care provider about how often you need these tests. Pap tests will help identify cell changes that can lead to cancer. Ask your health care provider about possible vaccines to protect yourself against HPV. Contact a health care provider if: You develop genital warts. The risk of cervical cancer is higher with certain types of HPV. Your menstrual period is heavier than normal or you develop bright red bleeding, which may include blood clots. You have abnormal vaginal discharge. You have a fever. Get help right away if: You have pain or cramps in the abdomen that get worse, and medicine does not help to relieve your pain. You feel   light-headed and are unusually weak, or you faint. Summary Cervical dysplasia is a condition in which a woman's cervix cells have abnormal changes. If left untreated, this condition may become more severe  and may progress to cervical cancer. Early detection, treatment, and follow-up care are very important in managing this condition. Have regular pelvic exams and Pap tests. Talk with your health care provider about how often you need these tests. Pap tests will help identify cell changes that can lead to cancer. This information is not intended to replace advice given to you by your health care provider. Make sure you discuss any questions you have with your health care provider. Document Revised: 07/22/2019 Document Reviewed: 07/22/2019 Elsevier Patient Education  2023 Elsevier Inc.  

## 2022-04-14 LAB — CYTOLOGY - PAP
Comment: NEGATIVE
Diagnosis: NEGATIVE
High risk HPV: NEGATIVE

## 2022-04-21 ENCOUNTER — Other Ambulatory Visit: Payer: Self-pay | Admitting: Family Medicine

## 2022-04-28 ENCOUNTER — Ambulatory Visit (INDEPENDENT_AMBULATORY_CARE_PROVIDER_SITE_OTHER): Payer: 59 | Admitting: Student

## 2022-04-28 ENCOUNTER — Encounter: Payer: Self-pay | Admitting: Student

## 2022-04-28 VITALS — BP 112/60 | HR 57 | Ht 68.5 in | Wt 138.0 lb

## 2022-04-28 DIAGNOSIS — Z1211 Encounter for screening for malignant neoplasm of colon: Secondary | ICD-10-CM

## 2022-04-28 DIAGNOSIS — R7303 Prediabetes: Secondary | ICD-10-CM | POA: Diagnosis not present

## 2022-04-28 DIAGNOSIS — Z0000 Encounter for general adult medical examination without abnormal findings: Secondary | ICD-10-CM

## 2022-04-28 DIAGNOSIS — Z23 Encounter for immunization: Secondary | ICD-10-CM

## 2022-04-28 LAB — POCT GLYCOSYLATED HEMOGLOBIN (HGB A1C): HbA1c, POC (prediabetic range): 5.8 % (ref 5.7–6.4)

## 2022-04-28 NOTE — Assessment & Plan Note (Signed)
A1c 5.8, continues to be prediabetic range.  Continue exercises and diet intervention.

## 2022-04-28 NOTE — Patient Instructions (Signed)
It was great to see you today! Thank you for choosing [facility] for your primary care. Kimberly Guerrero was seen for annual physical.  Today we addressed: We are checking your A1c today.  I have also ordered the fecal occult blood testing sample for colon cancer screening.  Please get your Tdap vaccine at your local pharmacy.  Things to do to Keep yourself Healthy - Exercise at least 30-45 minutes a day, 3-4 days a week. >150 min of moderate intensity per week is advised. - Eat a low-fat diet with lots of fruits and vegetables, up to 7-9 servings per day. - Seatbelts can save your life. Wear them always. - Smoke detectors on every level of your home, check batteries every year. - Eye Doctor - have an eye exam every 1-2 years - Safe sex - if you may be exposed to STDs, use a condom. - Alcohol If you drink, do it moderately, less than 1 drink per day. - [place].  Choose someone to speak for you if you are not able. - Depression is common in our stressful world.If you're feeling down or losing interest in things you normally enjoy, please come in for a visit. - Violence - If anyone is threatening or hurting you, please call immediately.   If you haven't already, sign up for My Chart to have easy access to your labs results, and communication with your primary care physician.  We are checking some labs today. If they are abnormal, I will call you. If they are normal, I will send you a MyChart message (if it is active) or a letter in the mail. If you do not hear about your labs in the next 2 weeks, please call the office. I recommend that you always bring your medications to each appointment as this makes it easy to ensure you are on the correct medications and helps [place] not miss refills when you need them.  You should return to our clinic Return in about 1 year (around 04/28/2023) for Annual physical. Please arrive 15 minutes before your appointment to ensure smooth  check in process.  We appreciate your efforts in making this happen.  Thank you for allowing me to participate in your care, Wells Guiles, DO 04/28/2022, 1:46 PM PGY-2, [place]
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
## 2022-04-29 DIAGNOSIS — Z1211 Encounter for screening for malignant neoplasm of colon: Secondary | ICD-10-CM | POA: Diagnosis not present

## 2022-05-01 LAB — FECAL OCCULT BLOOD, IMMUNOCHEMICAL: Fecal Occult Bld: NEGATIVE

## 2022-05-21 ENCOUNTER — Other Ambulatory Visit: Payer: Self-pay | Admitting: Family Medicine

## 2022-05-23 ENCOUNTER — Telehealth: Payer: Self-pay | Admitting: Student

## 2022-05-23 NOTE — Telephone Encounter (Signed)
Called patient to schedule Medicare Annual Wellness Visit (AWV). Left message for patient to call back and schedule Medicare Annual Wellness Visit (AWV).  Last date of AWV: 05/14/2021   Please schedule an AWVS appointment at any time with [facility] VISIT .  If any questions, please contact me at [phone].    Thank you,  [facility] Support [facility] Direct dial  [phone]

## 2022-05-26 ENCOUNTER — Telehealth: Payer: Self-pay | Admitting: Student

## 2022-05-26 NOTE — Telephone Encounter (Signed)
Contacted Cristy Folks to schedule their annual wellness visit. Appointment made for 05/30/2022.  Thank you,  [facility] Support [facility] Direct dial  [phone]

## 2022-05-30 ENCOUNTER — Ambulatory Visit (INDEPENDENT_AMBULATORY_CARE_PROVIDER_SITE_OTHER): Payer: 59

## 2022-05-30 DIAGNOSIS — Z0000 Encounter for general adult medical examination without abnormal findings: Secondary | ICD-10-CM | POA: Diagnosis not present

## 2022-05-30 NOTE — Patient Instructions (Signed)

## 2022-05-30 NOTE — Progress Notes (Addendum)
I connected with  Cristy Folks on 05/30/22 by a audio enabled telemedicine application and verified that I am speaking with the correct person using two identifiers.  Patient Location: Home  Provider Location: Home Office  I discussed the limitations of evaluation and management by telemedicine. The patient expressed understanding and agreed to proceed.   Subjective:   Kimberly Guerrero is a 72 y.o. female who presents for Medicare Annual (Subsequent) preventive examination.  Review of Systems  Per HPI unless specifically indicated below.    Cardiac Risk Factors include: advanced age (>70men, >26 women);female gender,Hypertension, and Hyperlipidemia.           Objective:       04/28/2022    1:14 PM 04/10/2022   11:51 AM 02/14/2022   10:34 AM  Vitals with BMI  Height 5' 8.5" 5\' 8"  5\' 9"   Weight 138 lbs 138 lbs 142 lbs  BMI 20.68 20.99 20.96  Systolic 112 118 161  Diastolic 60 74 70  Pulse 57 67     Today's Vitals   05/30/22 1107  PainSc: 0-No pain   There is no height or weight on file to calculate BMI.     04/28/2022    1:11 PM 06/03/2021    1:36 PM 05/27/2021    8:03 AM 05/14/2021   12:45 PM 05/07/2021   10:52 AM 04/17/2021   11:08 AM 10/09/2020    8:42 AM  Advanced Directives  Does Patient Have a Medical Advance Directive? No No No No No No No  Would patient like information on creating a medical advance directive? No - Patient declined No - Patient declined No - Patient declined Yes (MAU/Ambulatory/Procedural Areas - Information given) No - Patient declined No - Patient declined No - Patient declined    Current Medications (verified) Outpatient Encounter Medications as of 05/30/2022  Medication Sig   acetaminophen-codeine (TYLENOL #3) 300-30 MG tablet TAKE 2 TABLETS BY MOUTH EVERY 8 HOURS AS NEEDED FOR BACK PAIN   atorvastatin (LIPITOR) 40 MG tablet Take 1 tablet (40 mg total) by mouth daily. TAKE 1 TABLET(40 MG) BY MOUTH DAILY Strength: 40 mg   calcium-vitamin D  (OSCAL WITH D) 500-200 MG-UNIT tablet Take 2 tablets by mouth daily with breakfast.   cetirizine (ZYRTEC) 10 MG tablet Take 1 tablet (10 mg total) by mouth daily.   cyclobenzaprine (FLEXERIL) 10 MG tablet Take 1 tablet (10 mg total) by mouth 2 (two) times daily as needed for muscle spasms.   meloxicam (MOBIC) 7.5 MG tablet Take 1 tablet (7.5 mg total) by mouth daily.   No facility-administered encounter medications on file as of 05/30/2022.    Allergies (verified) Tramadol   History: Past Medical History:  Diagnosis Date   Bradycardia    previously been by cardiology-- dr Johney Frame in 2011 and dr Anne Fu, Theron Arista in epic 02-06-2010;  event monitor 03-07-2020 SR average HR 53 w/2 brief PAT and rare PVC pt is asymptomatic   Chronic back pain    DDD (degenerative disc disease), lumbosacral    Endometrial polyp    Essential hypertension 01/09/2020   Heart murmur    echo in epic 01-30-2020  ef 60-65%, G1DD, mild MR/ TR/ PR  no valve stenosis   Hypertension    OA (osteoarthritis)    PMB (postmenopausal bleeding)    Status post cataract extraction 02/14/2022   Bilateral surgery Spetember 2023  Dr. Vonna Kotyk   Wears glasses    Past Surgical History:  Procedure Laterality Date  CATARACT EXTRACTION, BILATERAL Bilateral 09/2021   DILATATION & CURETTAGE/HYSTEROSCOPY WITH MYOSURE N/A 05/27/2021   Procedure: DILATATION & CURETTAGE/HYSTEROSCOPY WITH MYOSURE;  Surgeon: Patton Salles, MD;  [facility]: [facility];  Service: Gynecology;  Laterality: N/A;   TUBAL LIGATION Bilateral    1970s   Family History  Problem Relation Age of Onset   Breast cancer Sister    Social History   Socioeconomic History   Marital status: Single    Spouse name: Not on file   Number of children: 1   Years of education: 12   Highest education level: High school graduate  Occupational History   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    Patient still drives herself to run errands and medical apts.    Patient is close with her daughter Efraim Kaufmann.    Aerobics 3-4 times per week for exercise.    Patient enjoys reading and cooking.    Social Determinants of Health   Financial Resource Strain: Low Risk  (05/30/2022)   Overall Financial Resource Strain (CARDIA)    Difficulty of Paying Living Expenses: Not hard at all  Food Insecurity: No Food Insecurity (05/30/2022)   Hunger Vital Sign    Worried About Running Out of Food in the Last Year: Never true    Ran Out of Food in the Last Year: Never true  Transportation Needs: No Transportation Needs (05/30/2022)   PRAPARE - Administrator, Civil Service (Medical): No    Lack of Transportation (Non-Medical): No  Physical Activity: Sufficiently Active (05/30/2022)   Exercise Vital Sign    Days of Exercise per Week: 7 days    Minutes of Exercise per Session: 60 min  Stress: No Stress Concern Present (05/30/2022)   [employer] of Occupational Health - Occupational Stress Questionnaire    Feeling of Stress : Not at all  Social Connections: Moderately Integrated (05/30/2022)   Social Connection and Isolation Panel [NHANES]    Frequency of Communication with Friends and Family: More than three times a week    Frequency of Social Gatherings with Friends and Family: More than three times a week    Attends Religious Services: More than 4 times per year    Active Member of [employer] or Organizations: Yes    Attends Hospital doctor: More than 4 times per year    Marital Status: Divorced    Tobacco Counseling Counseling given: No   Clinical Intake:  Pre-visit preparation completed: No  Pain : No/denies pain Pain Score: 0-No pain     Nutritional Status: BMI of 19-24  Normal Nutritional Risks: None Diabetes: No  How often do you need to have someone help you when you read instructions, pamphlets, or other written materials from your doctor or pharmacy?: 1 - Never  Diabetic?No  Interpreter Needed?: No  Information entered by :: [place]  Perlie Gold, CMA   Activities of Daily Living    05/30/2022   11:06 AM  In your present state of health, do you have any difficulty performing the following activities:  Hearing? 0  Vision? 0  Comment [facility]  Difficulty concentrating or making decisions? 0  Walking or climbing stairs? 0  Dressing or bathing? 0  Doing errands, shopping? 0    Patient Care Team: Shelby Mattocks, DO as PCP - General (Family Medicine) Jake Bathe, MD as PCP - Cardiology (Cardiology) Ardell Isaacs, Forrestine Him, MD as Consulting Physician (Obstetrics and Gynecology)  Indicate any recent Medical Services you may have received from other than [facility] providers in the past year (date may be approximate).     Assessment:   This is a routine wellness examination for Kimberly Guerrero.   Hearing/Vision screen Denies any hearing issues. Denies any change to her vision. Annual Eye Exam, [facility]  Dietary issues and exercise activities discussed: Current Exercise Habits: Structured exercise class, Type of exercise: walking, Time (Minutes): 45, Frequency (Times/Week): 7, Weekly Exercise (Minutes/Week): 315, Intensity: Moderate, Exercise limited by: None identified   Goals Addressed   None    Depression Screen    05/30/2022   11:06 AM 04/28/2022    1:14 PM 06/03/2021    1:37 PM 05/14/2021   12:43 PM 05/07/2021   10:53 AM 04/17/2021   11:09 AM 02/20/2021   11:31 AM  PHQ  2/9 Scores  PHQ - 2 Score 0 0 0 0 0 0 0  PHQ- 9 Score  0 0 0 0 0 0    Fall Risk    05/30/2022   11:06 AM 04/28/2022    1:14 PM 04/10/2022   11:58 AM 06/03/2021    1:36 PM 05/14/2021   12:45 PM  Fall Risk   Falls in the past year? 0 0 0 0 0  Number falls in past yr: 0 0 0 0 0  Injury with Fall? 0 0 0  0  Risk for fall due to : No Fall Risks No Fall Risks   No Fall Risks  Follow up Falls evaluation completed Falls prevention discussed       FALL RISK PREVENTION PERTAINING TO THE HOME:  Any stairs in or around the home? No  If so, are there any without handrails? No  Home free of loose throw rugs in walkways, pet beds, electrical cords, etc? Yes  Adequate lighting in your home to reduce risk of falls? Yes   ASSISTIVE DEVICES UTILIZED TO PREVENT FALLS:  Life alert? No  Use of a cane, walker or w/c? No  Grab bars in the bathroom? Yes  Shower chair or bench in shower? No  Elevated toilet seat or a handicapped toilet? No   TIMED UP AND GO:  Was the test performed?Unable to perform, virtual appointment    Cognitive Function:        05/30/2022   11:09 AM 05/14/2021   12:47 PM 08/02/2018    9:53 AM  6CIT Screen  What Year? 0 points 0 points 0 points  What month? 0 points 0 points 0 points  What time? 0 points 0 points 0 points  Count back from 20 0 points 0 points 0 points  Months in reverse 0 points 0 points 0 points  Repeat phrase 0 points 0 points 0 points  Total Score 0 points 0 points 0 points    Immunizations Immunization History  Administered Date(s) Administered  COVID-19, mRNA, vaccine(Comirnaty)12 years and older 04/28/2022   Fluad Quad(high Dose 65+) 01/05/2020, 10/09/2020   Influenza, High Dose Seasonal PF 11/02/2017   Influenza,inj,Quad PF,6+ Mos 02/19/2015, 10/24/2015, 12/01/2016, 02/24/2018, 10/05/2018   Moderna SARS-COV2 Booster Vaccination 12/05/2020   Moderna Sars-Covid-2 Vaccination 03/24/2019, 04/21/2019   PFIZER(Purple Top)SARS-COV-2 Vaccination  01/05/2020   Pneumococcal Conjugate-13 04/22/2016   Pneumococcal Polysaccharide-23 10/05/2018   Zoster Recombinat (Shingrix) 04/29/2021, 06/27/2021    TDAP status: Due, Education has been provided regarding the importance of this vaccine. Advised may receive this vaccine at local pharmacy or Health Dept. Aware to provide a copy of the vaccination record if obtained from local pharmacy or Health Dept. Verbalized acceptance and understanding.  Flu Vaccine status: Up to date  Pneumococcal vaccine status: Up to date  Covid-19 vaccine status: Information provided on how to obtain vaccines.   Qualifies for Shingles Vaccine? Yes   Zostavax completed Yes   Shingrix Completed?: Yes  Screening Tests Health Maintenance  Topic Date Due   DTaP/Tdap/Td (1 - Tdap) Never done   INFLUENZA VACCINE  08/28/2022   COLON CANCER SCREENING ANNUAL FOBT  04/29/2023   Medicare Annual Wellness (AWV)  05/30/2023   MAMMOGRAM  01/29/2024   Pneumonia Vaccine 64+ Years old  Completed   DEXA SCAN  Completed   COVID-19 Vaccine  Completed   Hepatitis C Screening  Completed   Zoster Vaccines- Shingrix  Completed   HPV VACCINES  Aged Out   COLONOSCOPY (Pts 45-30yrs Insurance coverage will need to be confirmed)  Discontinued    Health Maintenance  Health Maintenance Due  Topic Date Due   DTaP/Tdap/Td (1 - Tdap) Never done    Colorectal cancer screening: Type of screening: FOBT/FIT. Completed 04/29/2022. Repeat every 1 years  Mammogram status: Completed 01/28/22. Repeat every year  DEXA Scan: 09/14/2020  Lung Cancer Screening: (Low Dose CT Chest recommended if Age 48-80 years, 30 pack-year currently smoking OR have quit w/in 15years.) does not qualify.   Lung Cancer Screening Referral: not applicable   Additional Screening:  Hepatitis C Screening: does qualify; Completed 02/27/2015  Vision Screening: Recommended annual ophthalmology exams for early detection of glaucoma and other disorders of the  eye. Is the patient up to date with their annual eye exam?  Yes  Who is the provider or what is the name of the office in which the patient attends annual eye exams? [facility] If pt is not established with a provider, would they like to be referred to a provider to establish care? No .   Dental Screening: Recommended annual dental exams for proper oral hygiene  Community Resource Referral / Chronic Care Management: CRR required this visit?  No   CCM required this visit?  No      Plan:     I have personally reviewed and noted the following in the patient's chart:   Medical and social history Use of alcohol, tobacco or illicit drugs  Current medications and supplements including opioid prescriptions. Patient is not currently taking opioid prescriptions. Functional ability and status Nutritional status Physical activity Advanced directives List of other physicians Hospitalizations, surgeries, and ER visits in previous 12 months Vitals Screenings to include cognitive, depression, and falls Referrals and appointments  In addition, I have reviewed and discussed with patient certain preventive protocols, quality metrics, and best practice recommendations. A written personalized care plan for preventive services as well as general preventive health recommendations were provided to patient.    Kimberly Guerrero , Thank you for  taking time to come for your Medicare Wellness Visit. I appreciate your ongoing commitment to your health goals. Please review the following plan we discussed and let me know if I can assist you in the future.   These are the goals we discussed:  Goals      DIET - INCREASE WATER INTAKE     DIET - REDUCE SUGAR INTAKE     A1c 6.27 March 2021        This is a list of the screening recommended for you and due dates:  Health Maintenance  Topic Date Due   DTaP/Tdap/Td vaccine (1 - Tdap) Never done   Flu Shot  08/28/2022   Stool Blood Test  04/29/2023    Medicare Annual Wellness Visit  05/30/2023   Mammogram  01/29/2024   Pneumonia Vaccine  Completed   DEXA scan (bone density measurement)  Completed   COVID-19 Vaccine  Completed   Hepatitis C Screening: USPSTF Recommendation to screen - Ages 69-79 yo.  Completed   Zoster (Shingles) Vaccine  Completed   HPV Vaccine  Aged Out   Colon Cancer Screening  Discontinued     Lonna Cobb, [facility]   05/30/2022   Nurse Notes: Approximately 30 minute Non-Face -To-Face Medicare Wellness Visit   I have reviewed this visit and agree with the documentation.  Marshall L Chambliss

## 2022-06-02 DIAGNOSIS — H524 Presbyopia: Secondary | ICD-10-CM | POA: Diagnosis not present

## 2022-06-10 ENCOUNTER — Other Ambulatory Visit: Payer: Self-pay | Admitting: Student

## 2022-06-27 ENCOUNTER — Other Ambulatory Visit: Payer: Self-pay | Admitting: Family Medicine

## 2022-07-25 ENCOUNTER — Other Ambulatory Visit: Payer: Self-pay | Admitting: Family Medicine

## 2022-09-11 ENCOUNTER — Other Ambulatory Visit: Payer: Self-pay | Admitting: Family Medicine

## 2022-09-15 ENCOUNTER — Encounter: Payer: Self-pay | Admitting: Pharmacist

## 2022-09-15 NOTE — Progress Notes (Signed)
Assessed adherence. Patient appears adherent with most recent fill of atorvastatin on 09/08/22 for a 90 day supply.

## 2022-10-12 ENCOUNTER — Other Ambulatory Visit: Payer: Self-pay | Admitting: Family Medicine

## 2022-12-07 ENCOUNTER — Other Ambulatory Visit: Payer: Self-pay | Admitting: Student

## 2022-12-29 ENCOUNTER — Other Ambulatory Visit: Payer: Self-pay | Admitting: Family Medicine

## 2022-12-29 DIAGNOSIS — Z1231 Encounter for screening mammogram for malignant neoplasm of breast: Secondary | ICD-10-CM

## 2023-01-12 ENCOUNTER — Ambulatory Visit (INDEPENDENT_AMBULATORY_CARE_PROVIDER_SITE_OTHER): Payer: 59 | Admitting: Family Medicine

## 2023-01-12 VITALS — BP 124/60 | HR 57 | Ht 69.0 in | Wt 140.2 lb

## 2023-01-12 DIAGNOSIS — B351 Tinea unguium: Secondary | ICD-10-CM

## 2023-01-12 MED ORDER — TERBINAFINE HCL 250 MG PO TABS: 250.0000 mg | ORAL_TABLET | Freq: Every day | ORAL | 3 refills | Status: AC

## 2023-01-12 NOTE — Progress Notes (Signed)
    SUBJECTIVE:   CHIEF COMPLAINT / HPI:   Kimberly Guerrero is an 72 year old F with history of hypertension, osteopenia, prediabetes that presents for nail growth. - Noticed a chip on her toenail about a week ago.  -Reports that her toenails are thickened.  They are not causing her any pain. -Also reports some thickened callus skin around her toes that she was concerned about the appearance of.  OBJECTIVE:   BP 124/60   Pulse (!) 57   Ht 5\' 9"  (1.753 m)   Wt 140 lb 3.2 oz (63.6 kg)   SpO2 97%   BMI 20.70 kg/m   General: Alert, pleasant woman. NAD. HEENT: NCAT. MMM. Resp: Normal WOB on RA.  Ext: Moves all ext spontaneously Skin: Warm, well perfused  Feet: Thicken, yellow toenails on bilateral feet.  No wounds or skin breakdown.  There is a thickened callus around bilateral feet.  ASSESSMENT/PLAN:   Assessment & Plan Onychomycosis Toenails consistent with onychomycosis.  Provided reassurance that this is not a life-threatening condition.  Discussed supportive care versus treatment with terbinafine, patient prefers treatment with terbinafine. -Terbinafine 250 mg daily for 12 wks  - LFT today - return in 6 weeks for repeat LFT    Lincoln Brigham, MD [facility]

## 2023-01-12 NOTE — Patient Instructions (Signed)
Good to see you today - Thank you for coming in  Things we discussed today:  1) You have a toenail fungal infection called onychomycosis.  This is not life-threatening, but can cause discomfort and people may not like the appearance of their toenails. -Take terbinafine once a day for the next 12 weeks -We will check your liver labs today. -These come back in 6 weeks so that we can recheck your labs at that time.  Want to make sure that your body is tolerating this medication well

## 2023-01-13 LAB — HEPATIC FUNCTION PANEL
ALT: 15 [IU]/L (ref 0–32)
AST: 26 [IU]/L (ref 0–40)
Albumin: 4.6 g/dL (ref 3.8–4.8)
Alkaline Phosphatase: 101 [IU]/L (ref 44–121)
Bilirubin Total: 0.4 mg/dL (ref 0.0–1.2)
Bilirubin, Direct: 0.14 mg/dL (ref 0.00–0.40)
Total Protein: 6.8 g/dL (ref 6.0–8.5)

## 2023-01-14 ENCOUNTER — Telehealth: Payer: Self-pay | Admitting: Student

## 2023-01-14 NOTE — Telephone Encounter (Signed)
Patient calls regarding her last AVS from her appointment with Dr Sherrilee Gilles on 01/12/2023 - her AVS states she is an 72 year old woman, but she is 42. Not sure if this can be edited to the correct age, but told patient I would send Sherrilee Gilles a message regarding it.

## 2023-01-30 ENCOUNTER — Ambulatory Visit
Admission: RE | Admit: 2023-01-30 | Discharge: 2023-01-30 | Disposition: A | Discharge status: Home / Self Care | Payer: 59 | Source: Ambulatory Visit | Attending: Family Medicine | Admitting: Family Medicine

## 2023-01-30 DIAGNOSIS — Z1231 Encounter for screening mammogram for malignant neoplasm of breast: Secondary | ICD-10-CM

## 2023-02-20 ENCOUNTER — Ambulatory Visit (INDEPENDENT_AMBULATORY_CARE_PROVIDER_SITE_OTHER): Payer: 59 | Admitting: Family Medicine

## 2023-02-20 VITALS — BP 122/74 | Ht 69.0 in

## 2023-02-20 DIAGNOSIS — M5442 Lumbago with sciatica, left side: Secondary | ICD-10-CM | POA: Diagnosis not present

## 2023-02-20 DIAGNOSIS — M533 Sacrococcygeal disorders, not elsewhere classified: Secondary | ICD-10-CM | POA: Diagnosis not present

## 2023-02-20 DIAGNOSIS — M5416 Radiculopathy, lumbar region: Secondary | ICD-10-CM | POA: Diagnosis not present

## 2023-02-20 DIAGNOSIS — G8929 Other chronic pain: Secondary | ICD-10-CM | POA: Diagnosis not present

## 2023-02-20 MED ORDER — CYCLOBENZAPRINE HCL 10 MG PO TABS: 10.0000 mg | ORAL_TABLET | Freq: Two times a day (BID) | ORAL | 5 refills | Status: DC | PRN

## 2023-02-20 MED ORDER — ACETAMINOPHEN-CODEINE 300-30 MG PO TABS: ORAL_TABLET | ORAL | 3 refills | Status: DC

## 2023-02-21 NOTE — Progress Notes (Signed)
  Kimberly Guerrero - 73 y.o. female MRN [id number]  Date of birth: July 13, 1950    SUBJECTIVE:      Chief Complaint:/ HPI:  Chronic low back pain: Here for follow-up.  Says it is maybe a little worse.  Pain medication seems to help but she really does not want to have to take that long-term.  Says she is finally ready to consider evaluation by neurosurgeon although she is not saying that she is ready for surgery. 2.  Has some questions about some darkening skin on her feet. 3.  PCP had given her some Lamisil orally for onychomycosis.  She is not convinced that she needs to treat this and wonders if she can just stop the medicine abruptly.    OBJECTIVE: BP 122/74   Ht 5\' 9"  (1.753 m)   BMI 20.70 kg/m   Physical Exam:  Vital signs are reviewed. GENERAL: Well-developed, no acute distress SKIN: Dorsum of both feet have some slightly hyperpigmented areas most notable over the toes.  Benign appearing.  Onychomycosis most notable in the great toenail.  ASSESSMENT & PLAN: Chronic back pain multifactorial.  Will continue current medication for pain with Tylenol 3.  Refilled.  Will send her for further evaluation by neurosurgery.  I do think it is appropriate for her to get some more information about what surgery would entail and how much she could plan on that helping her as well as length of time she would need to recover from it. 2.  Reassured regarding hyperpigmented areas on the feet: These seem benign. 3.  Onychomycosis: She wishes to stop the medication, Lamisil, there is no downside to stopping it abruptly. See problem based charting & AVS for pt instructions. No problem-specific Assessment & Plan notes found for this encounter.

## 2023-02-23 ENCOUNTER — Other Ambulatory Visit: Payer: 59

## 2023-04-03 ENCOUNTER — Telehealth: Payer: Self-pay

## 2023-04-03 ENCOUNTER — Other Ambulatory Visit: Payer: Self-pay | Admitting: Family Medicine

## 2023-04-03 MED ORDER — MELOXICAM 7.5 MG PO TABS: 7.5000 mg | ORAL_TABLET | Freq: Every day | ORAL | 3 refills | Status: DC

## 2023-04-03 NOTE — Telephone Encounter (Signed)
-----   Message from [place] C sent at 04/03/2023 10:53 AM EST -----  Pt is asking for refill on meloxicam (MOBIC) 7.5 MG table

## 2023-04-04 MED ORDER — MELOXICAM 7.5 MG PO TABS: 7.5000 mg | ORAL_TABLET | Freq: Every day | ORAL | 0 refills | Status: DC

## 2023-04-04 NOTE — Addendum Note (Signed)
 Addended by: Levin Erp on: 04/04/2023 11:42 AM   Modules accepted: Orders

## 2023-04-24 ENCOUNTER — Telehealth: Payer: Self-pay

## 2023-04-24 NOTE — Telephone Encounter (Signed)
 Patient calls nurse line reporting GI symptoms.   She reports symptoms started on Monday with loose stools. She reports she was starting to feel better and ate soup with green beans on the side. She reports she had more loose stools shortly after.   She reports she has been ok today and denies anymore episodes. She denies any fevers or chills. No bloody stools. No blood. No nausea or vomiting.   Patient advised to follow a bland diet, bananas, toast etc... Stay well hydrated. Patient advised to monitor symptoms. If symptoms worsen or do not improve we would want to see her in clinic next week.   Precautions discussed.

## 2023-05-21 DIAGNOSIS — H524 Presbyopia: Secondary | ICD-10-CM | POA: Diagnosis not present

## 2023-05-26 ENCOUNTER — Encounter: Payer: Self-pay | Admitting: Student

## 2023-05-26 ENCOUNTER — Ambulatory Visit (INDEPENDENT_AMBULATORY_CARE_PROVIDER_SITE_OTHER): Admitting: Student

## 2023-05-26 VITALS — BP 122/80 | HR 58 | Temp 98.4°F | Ht 69.0 in | Wt 159.8 lb

## 2023-05-26 DIAGNOSIS — Z1211 Encounter for screening for malignant neoplasm of colon: Secondary | ICD-10-CM | POA: Diagnosis not present

## 2023-05-26 DIAGNOSIS — M85859 Other specified disorders of bone density and structure, unspecified thigh: Secondary | ICD-10-CM | POA: Diagnosis not present

## 2023-05-26 DIAGNOSIS — Z0000 Encounter for general adult medical examination without abnormal findings: Secondary | ICD-10-CM

## 2023-05-26 DIAGNOSIS — E7800 Pure hypercholesterolemia, unspecified: Secondary | ICD-10-CM | POA: Diagnosis not present

## 2023-05-26 DIAGNOSIS — R7303 Prediabetes: Secondary | ICD-10-CM

## 2023-05-26 LAB — POCT GLYCOSYLATED HEMOGLOBIN (HGB A1C): HbA1c, POC (prediabetic range): 5.8 % (ref 5.7–6.4)

## 2023-05-26 NOTE — Assessment & Plan Note (Signed)
 Recheck A1c

## 2023-05-26 NOTE — Assessment & Plan Note (Signed)
 Up-to-date on vaccinations, screening components below.  Provided advanced directive booklet and discussed with patient.  She exercises 150 minutes/week and is at appropriate weight.

## 2023-05-26 NOTE — Assessment & Plan Note (Signed)
 Previous DEXA 2022 with T-score -2.0.  Order recheck.  This will need to be monitored annually given the extent of her osteopenia.

## 2023-05-26 NOTE — Progress Notes (Signed)
  SUBJECTIVE:   CHIEF COMPLAINT / HPI:   Presents today for annual check, no specific concerns.  PERTINENT  PMH / PSH: HLD, prediabetes, osteoarthritis, osteopenia  OBJECTIVE:  BP 122/80   Pulse (!) 58   Temp 98.4 F (36.9 C)   Ht 5\' 9"  (1.753 m)   Wt 159 lb 12.8 oz (72.5 kg)   SpO2 99%   BMI 23.60 kg/m  General: Well-appearing, NAD CV: RRR, no murmurs appreciable Pulm: CTAB, normal WOB Abdomen: normoactive bowel sounds  ASSESSMENT/PLAN:   Assessment & Plan Encounter for annual physical exam Up-to-date on vaccinations, screening components below.  Provided advanced directive booklet and discussed with patient.  She exercises 150 minutes/week and is at appropriate weight. Prediabetes Recheck A1c. Pure hypercholesterolemia Adherent with statin, eats 2-3 eggs daily.  Obtain nonfasting lipid panel. Osteopenia of neck of femur, unspecified laterality Previous DEXA 2022 with T-score -2.0.  Order recheck.  This will need to be monitored annually given the extent of her osteopenia. Screen for colon cancer Yearly fecal occult blood, immunochemical. Return if symptoms worsen or fail to improve. Kimberly Goon, DO 05/26/2023, 11:54 AM PGY-3, [facility]

## 2023-05-26 NOTE — Assessment & Plan Note (Signed)
 Adherent with statin, eats 2-3 eggs daily.  Obtain nonfasting lipid panel.

## 2023-05-26 NOTE — Patient Instructions (Addendum)
 It was great to see you today! Thank you for choosing [facility] for your primary care.  Today we addressed: Osteopenia: Continue to take your calcium  and vitamin D .  I am ordering another bone scan for you. We are checking blood work and also doing another screening for colon cancer. Provide advanced directive booklet today.  Things to do to Keep yourself Healthy - Exercise at least 30-45 minutes a day, 3-4 days a week. >150 min of moderate intensity per week is advised. - Eat a low-fat diet with lots of fruits and vegetables, up to 7-9 servings per day. - Seatbelts can save your life. Wear them always. - Smoke detectors on every level of your home, check batteries every year. - Eye Doctor - have an eye exam every 1-2 years - Safe sex - if you may be exposed to STDs, use a condom. - Alcohol If you drink, do it moderately, less than 1 drink per day. - Health Care Power of Attorney.  Choose someone to speak for you if you are not able. - Depression is common in our stressful world.If you're feeling down or losing interest in things you normally enjoy, please come in for a visit. - Violence - If anyone is threatening or hurting you, please call immediately.   If you haven't already, sign up for My Chart to have easy access to your labs results, and communication with your primary care physician. We are checking some labs today. If they are abnormal, I will call you. If they are normal, I will send you a MyChart message (if it is active) or a letter in the mail. If you do not hear about your labs in the next 2 weeks, please call the office. Return if symptoms worsen or fail to improve. Please arrive 15 minutes before your appointment to ensure smooth check in process.  We appreciate your efforts in making this happen.  Thank you for allowing me to participate in your care, [name], DO 05/26/2023, 11:44 AM PGY-3, [facility]

## 2023-05-27 LAB — LIPID PANEL
Chol/HDL Ratio: 1.6 ratio (ref 0.0–4.4)
Cholesterol, Total: 145 mg/dL (ref 100–199)
HDL: 90 mg/dL (ref >39)
LDL Chol Calc (NIH): 44 mg/dL (ref 0–99)
Triglycerides: 51 mg/dL (ref 0–149)
VLDL Cholesterol Cal: 11 mg/dL (ref 5–40)

## 2023-05-27 LAB — COMPREHENSIVE METABOLIC PANEL WITH GFR
ALT: 14 IU/L (ref 0–32)
AST: 21 IU/L (ref 0–40)
Albumin: 4.5 g/dL (ref 3.8–4.8)
Alkaline Phosphatase: 105 IU/L (ref 44–121)
BUN/Creatinine Ratio: 32 — ABNORMAL HIGH (ref 12–28)
BUN: 23 mg/dL (ref 8–27)
Bilirubin Total: 0.3 mg/dL (ref 0.0–1.2)
CO2: 23 mmol/L (ref 20–29)
Calcium: 10.2 mg/dL (ref 8.7–10.3)
Chloride: 106 mmol/L (ref 96–106)
Creatinine, Ser: 0.73 mg/dL (ref 0.57–1.00)
Globulin, Total: 2.2 g/dL (ref 1.5–4.5)
Glucose: 81 mg/dL (ref 70–99)
Potassium: 5 mmol/L (ref 3.5–5.2)
Sodium: 144 mmol/L (ref 134–144)
Total Protein: 6.7 g/dL (ref 6.0–8.5)
eGFR: 87 mL/min/{1.73_m2} (ref >59)

## 2023-05-28 DIAGNOSIS — Z1211 Encounter for screening for malignant neoplasm of colon: Secondary | ICD-10-CM | POA: Diagnosis not present

## 2023-05-31 LAB — FECAL OCCULT BLOOD, IMMUNOCHEMICAL: Fecal Occult Bld: NEGATIVE

## 2023-06-04 ENCOUNTER — Ambulatory Visit: Payer: 59

## 2023-06-04 VITALS — Ht 69.0 in | Wt 141.0 lb

## 2023-06-04 DIAGNOSIS — Z0000 Encounter for general adult medical examination without abnormal findings: Secondary | ICD-10-CM | POA: Diagnosis not present

## 2023-06-04 NOTE — Patient Instructions (Addendum)
 Kimberly Guerrero , Thank you for taking time to come for your Medicare Wellness Visit. I appreciate your ongoing commitment to your health goals. Please review the following plan we discussed and let me know if I can assist you in the future.   Referrals/Orders/Follow-Ups/Clinician Recommendations: You are doing an amazing job with your health.  Your labs looked great! Keep up the good work.  This is a list of the screening recommended for you and due dates:  Health Maintenance  Topic Date Due   DTaP/Tdap/Td vaccine (1 - Tdap) Never done   COVID-19 Vaccine (7 - 2024-25 season) 09/28/2022   Flu Shot  08/28/2023   Stool Blood Test  05/27/2024   Medicare Annual Wellness Visit  06/03/2024   Mammogram  01/29/2025   Pneumonia Vaccine  Completed   DEXA scan (bone density measurement)  Completed   Hepatitis C Screening  Completed   Zoster (Shingles) Vaccine  Completed   HPV Vaccine  Aged Out   Meningitis B Vaccine  Aged Out   Colon Cancer Screening  Discontinued    Advanced directives: Pending completion.  Next Medicare Annual Wellness Visit scheduled for next year: Yes, You are scheduled with Nurse Health Advisor for PHONE VISIT on 06/09/2024 at 11:10 a.m.  Have you seen your provider in the last 6 months (3 months if uncontrolled diabetes)? Yes

## 2023-06-04 NOTE — Progress Notes (Signed)
 Because this visit was a virtual/telehealth visit,  certain criteria was not obtained, such a blood pressure, CBG if applicable, and timed get up and go. Any medications not marked as "taking" were not mentioned during the medication reconciliation part of the visit. Any vitals not documented were not able to be obtained due to this being a telehealth visit or patient was unable to self-report a recent blood pressure reading due to a lack of equipment at home via telehealth. Vitals that have been documented are verbally provided by the patient.   Subjective:   Kimberly Guerrero is a 73 y.o. who presents for a Medicare Wellness preventive visit.  Visit Complete: Virtual I connected with  Candee Cha on 06/04/23 by a audio enabled telemedicine application and verified that I am speaking with the correct person using two identifiers.  Patient Location: Home  Provider Location: Office/Clinic  I discussed the limitations of evaluation and management by telemedicine. The patient expressed understanding and agreed to proceed.  Vital Signs: Because this visit was a virtual/telehealth visit, some criteria may be missing or patient reported. Any vitals not documented were not able to be obtained and vitals that have been documented are patient reported.  VideoDeclined- This patient declined Librarian, academic. Therefore the visit was completed with audio only.  Persons Participating in Visit: Patient.  AWV Questionnaire: No: Patient Medicare AWV questionnaire was not completed prior to this visit.  Cardiac Risk Factors include: advanced age (>61men, >39 women);dyslipidemia     Objective:    Today's Vitals   06/04/23 1112 06/04/23 1116  Weight: 141 lb (64 kg)   Height: 5\' 9"  (1.753 m)   PainSc:  0-No pain   Body mass index is 20.82 kg/m.     06/04/2023   11:18 AM 05/26/2023   11:26 AM 04/28/2022    1:11 PM 06/03/2021    1:36 PM 05/27/2021    8:03 AM 05/14/2021    12:45 PM 05/07/2021   10:52 AM  Advanced Directives  Does Patient Have a Medical Advance Directive? No No No No No No No  Would patient like information on creating a medical advance directive? No - Patient declined  No - Patient declined No - Patient declined No - Patient declined Yes (MAU/Ambulatory/Procedural Areas - Information given) No - Patient declined    Current Medications (verified) Outpatient Encounter Medications as of 06/04/2023  Medication Sig   acetaminophen -codeine  (TYLENOL  #3) 300-30 MG tablet TAKE 2 TABLETS BY MOUTH EVERY 8 HOURS AS NEEDED FOR BACK PAIN   atorvastatin  (LIPITOR) 40 MG tablet TAKE 1 TABLET BY MOUTH EVERY DAY   calcium -vitamin D  (OSCAL WITH D) 500-200 MG-UNIT tablet Take 2 tablets by mouth daily with breakfast.   cetirizine  (ZYRTEC ) 10 MG tablet Take 1 tablet (10 mg total) by mouth daily.   cyclobenzaprine  (FLEXERIL ) 10 MG tablet TAKE 1 TABLET(10 MG) BY MOUTH TWICE DAILY AS NEEDED FOR MUSCLE SPASMS   meloxicam  (MOBIC ) 7.5 MG tablet Take 1 tablet (7.5 mg total) by mouth daily. NEEDS LABS PRIOR TO FURTHER/LONGER REFILLS   terbinafine  (LAMISIL ) 250 MG tablet Take 1 tablet (250 mg total) by mouth daily.   No facility-administered encounter medications on file as of 06/04/2023.    Allergies (verified) Tramadol    History: Past Medical History:  Diagnosis Date   Bradycardia    previously been by cardiology-- dr Nunzio Belch in 2011 and dr Renna Cary, lov in epic 02-06-2010;  event monitor 03-07-2020 SR average HR 53 w/2 brief PAT  and rare PVC pt is asymptomatic   Chronic back pain    DDD (degenerative disc disease), lumbosacral    Endometrial polyp    Essential hypertension 01/09/2020   Heart murmur    echo in epic 01-30-2020  ef 60-65%, G1DD, mild MR/ TR/ PR  no valve stenosis   Hypertension    OA (osteoarthritis)    PMB (postmenopausal bleeding)    Status post cataract extraction 02/14/2022   Bilateral surgery Spetember 2023  Dr. Lenna Quinton   Wears glasses    Past  Surgical History:  Procedure Laterality Date   CATARACT EXTRACTION, BILATERAL Bilateral 09/2021   DILATATION & CURETTAGE/HYSTEROSCOPY WITH MYOSURE N/A 05/27/2021   Procedure: DILATATION & CURETTAGE/HYSTEROSCOPY WITH MYOSURE;  Surgeon: Greta Leatherwood, MD;  Location: [facility];  Service: Gynecology;  Laterality: N/A;   TUBAL LIGATION Bilateral    1970s   Family History  Problem Relation Age of Onset   Breast cancer Sister    Social History   Socioeconomic History   Marital status: Single    Spouse name: Not on file   Number of children: 1   Years of education: 12   Highest education level: Associate degree: occupational, Scientist, product/process development, or vocational program  Occupational History   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    Patient still drives herself to run errands and medical apts.    Patient is close with her daughter Lovett Ruck.    Aerobics 3-4 times per week for exercise.    Patient enjoys reading and cooking.    Social Drivers of Corporate investment banker Strain: Low Risk  (05/22/2023)   Overall Financial Resource Strain (CARDIA)    Difficulty of Paying Living Expenses: Not very hard  Food Insecurity: No Food Insecurity (06/04/2023)   Hunger Vital Sign    Worried About Running Out of Food in the Last Year: Never true    Ran Out of Food in the Last Year: Never true  Transportation Needs: No Transportation Needs (06/04/2023)   PRAPARE -  Administrator, Civil Service (Medical): No    Lack of Transportation (Non-Medical): No  Physical Activity: Sufficiently Active (06/04/2023)   Exercise Vital Sign    Days of Exercise per Week: 7 days    Minutes of Exercise per Session: 30 min  Stress: No Stress Concern Present (05/22/2023)   [employer] of Occupational Health - Occupational Stress Questionnaire    Feeling of Stress : Not at all  Social Connections: Unknown (05/22/2023)   Social Connection and Isolation Panel [NHANES]    Frequency of Communication with Friends and Family: More than three times a week    Frequency of Social Gatherings with Friends and Family: Twice a week    Attends Religious Services: More than 4 times per year    Active Member of [employer] or Organizations: Not on file  Attends Engineer, structural: Not on file    Marital Status: Divorced    Tobacco Counseling Counseling given: Not Answered    Clinical Intake:  Pre-visit preparation completed: Yes  Pain : No/denies pain Pain Score: 0-No pain     BMI - recorded: 20.82 Nutritional Status: BMI of 19-24  Normal Nutritional Risks: None Diabetes: No  Lab Results  Component Value Date   HGBA1C 5.8 05/26/2023   HGBA1C 5.8 04/28/2022   HGBA1C 6.1 04/17/2021     How often do you need to have someone help you when you read instructions, pamphlets, or other written materials from your doctor or pharmacy?: 1 - Never What is the last grade level you completed in school?: 2 YEARS OF COLLEGE  Interpreter Needed?: No  Information entered by :: Kamariya Blevens N. Aleya Durnell, LPN.   Activities of Daily Living     06/04/2023   11:21 AM  In your present state of health, do you have any difficulty performing the following activities:  Hearing? 0  Vision? 0  Difficulty concentrating or making decisions? 0  Comment BSE: reading, games on phone, crotcheting  Walking or climbing stairs? 0  Dressing or bathing? 0  Doing errands,  shopping? 0  Preparing Food and eating ? N  Using the Toilet? N  In the past six months, have you accidently leaked urine? N  Do you have problems with loss of bowel control? N  Managing your Medications? N  Managing your Finances? N  Housekeeping or managing your Housekeeping? N    Patient Care Team: [name], DO as PCP - General (Family Medicine) Hugh Madura, MD as PCP - Cardiology (Cardiology) Greta Leatherwood, MD as Consulting Physician (Obstetrics and Gynecology) Donalynn Fry, OD as Consulting Physician (Optometry)  Indicate any recent Medical Services you may have received from other than [facility] providers in the past year (date may be approximate).     Assessment:    This is a routine wellness examination for Marlaine.  Hearing/Vision screen Hearing Screening - Comments:: Denies hearing difficulties.  Vision Screening - Comments:: Wears rx glasses - up to date with routine eye exams with [facility]     Goals Addressed               This Visit's Progress     Patient Stated (pt-stated)        06/04/2023: My goal is to stay healthy, physically active, independent and continue to keep my A1C down.       Depression Screen     06/04/2023   11:22 AM 05/30/2022   11:06 AM 04/28/2022    1:14 PM 06/03/2021    1:37 PM 05/14/2021   12:43 PM 05/07/2021   10:53 AM 04/17/2021   11:09 AM  PHQ 2/9 Scores  PHQ - 2 Score 0 0 0 0 0 0 0  PHQ- 9 Score 0  0 0 0 0 0    Fall Risk     06/04/2023   11:18 AM 05/30/2022   11:06 AM 04/28/2022    1:14 PM 04/10/2022   11:58 AM 06/03/2021    1:36 PM  Fall Risk   Falls in the past year? 0 0 0 0 0  Number falls in past yr: 0 0 0 0 0  Injury with Fall? 0 0 0 0   Risk for fall due to : No Fall Risks No Fall Risks No Fall Risks    Follow up Falls prevention discussed;Falls  evaluation completed Falls evaluation completed Falls prevention discussed      MEDICARE RISK AT HOME:  Medicare Risk at Home Any stairs in  or around the home?: No If so, are there any without handrails?: No Home free of loose throw rugs in walkways, pet beds, electrical cords, etc?: Yes Adequate lighting in your home to reduce risk of falls?: Yes Life alert?: No Use of a cane, walker or w/c?: No Grab bars in the bathroom?: Yes Shower chair or bench in shower?: No (SLIP RESISTENT MAT) Elevated toilet seat or a handicapped toilet?: No  TIMED UP AND GO:  Was the test performed?  No  Cognitive Function: 6CIT completed    06/04/2023   11:23 AM  MMSE - Mini Mental State Exam  Not completed: Unable to complete        06/04/2023   11:19 AM 05/30/2022   11:09 AM 05/14/2021   12:47 PM 08/02/2018    9:53 AM  6CIT Screen  What Year? 0 points 0 points 0 points 0 points  What month? 0 points 0 points 0 points 0 points  What time? 0 points 0 points 0 points 0 points  Count back from 20 0 points 0 points 0 points 0 points  Months in reverse 0 points 0 points 0 points 0 points  Repeat phrase 0 points 0 points 0 points 0 points  Total Score 0 points 0 points 0 points 0 points    Immunizations Immunization History  Administered Date(s) Administered   Fluad Quad(high Dose 65+) 01/05/2020, 10/09/2020   Influenza, High Dose Seasonal PF 11/02/2017   Influenza,inj,Quad PF,6+ Mos 02/19/2015, 10/24/2015, 12/01/2016, 02/24/2018, 10/05/2018   Influenza-Unspecified 01/29/2022   Moderna SARS-COV2 Booster Vaccination 12/05/2020   Moderna Sars-Covid-2 Vaccination 03/24/2019, 04/21/2019, 12/05/2020   PFIZER(Purple Top)SARS-COV-2 Vaccination 01/05/2020   Pfizer(Comirnaty)Fall Seasonal Vaccine 12 years and older 04/28/2022   Pneumococcal Conjugate-13 04/22/2016   Pneumococcal Polysaccharide-23 10/05/2018   Zoster Recombinant(Shingrix) 04/29/2021, 06/27/2021    Screening Tests Health Maintenance  Topic Date Due   DTaP/Tdap/Td (1 - Tdap) Never done   COVID-19 Vaccine (7 - 2024-25 season) 09/28/2022   INFLUENZA VACCINE  08/28/2023   COLON  CANCER SCREENING ANNUAL FOBT  05/27/2024   Medicare Annual Wellness (AWV)  06/03/2024   MAMMOGRAM  01/29/2025   Pneumonia Vaccine 97+ Years old  Completed   DEXA SCAN  Completed   Hepatitis C Screening  Completed   Zoster Vaccines- Shingrix  Completed   HPV VACCINES  Aged Out   Meningococcal B Vaccine  Aged Out   Colonoscopy  Discontinued    Health Maintenance  Health Maintenance Due  Topic Date Due   DTaP/Tdap/Td (1 - Tdap) Never done   COVID-19 Vaccine (7 - 2024-25 season) 09/28/2022   Health Maintenance Items Addressed: Yes Patient aware of current care gaps.  NCIR was verified and updated in chart.  Additional Screening:  Vision Screening: Recommended annual ophthalmology exams for early detection of glaucoma and other disorders of the eye.  Dental Screening: Recommended annual dental exams for proper oral hygiene  Community Resource Referral / Chronic Care Management: CRR required this visit?  No   CCM required this visit?  No     Plan:     I have personally reviewed and noted the following in the patient's chart:   Medical and social history Use of alcohol, tobacco or illicit drugs  Current medications and supplements including opioid prescriptions. Patient is not currently taking opioid prescriptions. Functional ability and status Nutritional  status Physical activity Advanced directives List of other physicians Hospitalizations, surgeries, and ER visits in previous 12 months Vitals Screenings to include cognitive, depression, and falls Referrals and appointments  In addition, I have reviewed and discussed with patient certain preventive protocols, quality metrics, and best practice recommendations. A written personalized care plan for preventive services as well as general preventive health recommendations were provided to patient.     Margette Sheldon, LPN   10/02/452   After Visit Summary: (MyChart) Due to this being a telephonic visit, the after  visit summary with patients personalized plan was offered to patient via MyChart   Nurse Notes: Patient aware of current care gaps.  NCIR was verified and updated in chart.

## 2023-07-06 ENCOUNTER — Other Ambulatory Visit: Payer: Self-pay | Admitting: Family Medicine

## 2023-07-07 ENCOUNTER — Encounter: Payer: Self-pay | Admitting: *Deleted

## 2023-09-02 ENCOUNTER — Other Ambulatory Visit: Payer: Self-pay

## 2023-09-03 MED ORDER — ATORVASTATIN CALCIUM 40 MG PO TABS: 40.0000 mg | ORAL_TABLET | Freq: Every day | ORAL | 3 refills | Status: AC

## 2023-11-02 ENCOUNTER — Ambulatory Visit: Admitting: Family Medicine

## 2023-11-02 VITALS — BP 114/70 | HR 58 | Ht 69.0 in | Wt 144.4 lb

## 2023-11-02 DIAGNOSIS — M25511 Pain in right shoulder: Secondary | ICD-10-CM | POA: Diagnosis not present

## 2023-11-02 DIAGNOSIS — Z23 Encounter for immunization: Secondary | ICD-10-CM | POA: Diagnosis not present

## 2023-11-02 MED ORDER — DICLOFENAC SODIUM 1 % EX GEL: 2.0000 g | Freq: Four times a day (QID) | CUTANEOUS | 1 refills | Status: AC | PRN

## 2023-11-02 MED ORDER — TETANUS-DIPHTH-ACELL PERTUSSIS 5-2-15.5 LF-MCG/0.5 IM SUSP
0.5000 mL | Freq: Once | INTRAMUSCULAR | 0 refills | Status: AC
Start: 2023-11-02 — End: 2023-11-02

## 2023-11-02 NOTE — Progress Notes (Signed)
    SUBJECTIVE:   CHIEF COMPLAINT / HPI:   R Shoulder Pain - Sudden onset, no injury/fall, R shoulder pain for a week - Tried Biofreeze and heating pad at home, but hasn't tried Tylenol  or Ibuprofen  - Pain is aggravated by reaching forward, but doesn't associate any weakness, and sometimes painful when laying on her R shoulder - Pain hasn't improved with Biofreeze or heating pad  PERTINENT  PMH / PSH: HTN, DDD, bradycardia  OBJECTIVE:   BP 114/70   Pulse (!) 58   Ht 5' 9 (1.753 m)   Wt 144 lb 6.4 oz (65.5 kg)   SpO2 97%   BMI 21.32 kg/m   General: Awake and Alert in NAD HEENT: NCAT. Sclera anicteric. No rhinorrhea. Cardiovascular: RRR. No M/R/G Respiratory: CTAB, normal WOB on RA. No wheezing, crackles, rhonchi, or diminished breath sounds. Abdomen: Soft, non-tender, non-distended. Bowel sounds normoactive MSK: Able to move all extremities. No BLE edema, no deformities or significant joint findings. Skin: Warm and dry. No abrasions or rashes noted. Neuro: A&Ox3. No focal neurological deficits.  ASSESSMENT/PLAN:   Assessment & Plan Acute pain of right shoulder Shoulder pain x 1 week, complete utilization of conservative measures have not been performed thus far.  DDx includes osteoporosis (most likely with the intermittent pain primarily with the use), R shoulder bursitis (less likely w/ lack of swelling or TTP or pain with ROM), and R rotator cuff tear (unlikely with negative special tests).  Numerous treatment options were discussed at today's visit including conservative treatment with ibuprofen  and Tylenol  as needed, Voltaren  gel application, PT, steroid injection, and referral to [facility]. - Will proceed with Tylenol  and ibuprofen  as needed at this time along with Voltaren  gel application - Advised patient to follow-up if symptoms continue to persist or she notices no improvement in order to consider next apps which would be PT or steroid injection Encounter for  immunization Flu and COVID vaccines administered by Thersia Meissner. Prescribed Tdap to be performed at [facility].   Kathrine Melena, DO [facility]

## 2023-11-02 NOTE — Patient Instructions (Addendum)
 It was great to see you today! Thank you for choosing [facility] for your primary care. Kimberly Guerrero was seen for R shoulder pain.  Today we addressed: R shoulder pain - likely secondary to osteoarthritis. Lets try Tylenol  and Ibuprofen  along with Voltaren  gel over the R shoulder to help with your symptoms If your symptoms persist we can consider physical therapy, steroid injection into the R shoulder, or Sports Medicine referral COVID and Flu Shots today! Please get the Tdap vaccine at your pharmacy.  You should return to our clinic Return if symptoms worsen or fail to improve. Please arrive 15 minutes before your appointment to ensure smooth check in process.  We appreciate your efforts in making this happen.  Thank you for allowing me to participate in your care, Kathrine Melena, DO 11/02/2023, 3:10 PM PGY-2, [facility]

## 2023-11-09 ENCOUNTER — Other Ambulatory Visit: Payer: Self-pay

## 2023-11-09 MED ORDER — ACETAMINOPHEN-CODEINE 300-30 MG PO TABS: ORAL_TABLET | ORAL | 3 refills | Status: AC

## 2023-11-20 ENCOUNTER — Ambulatory Visit: Admitting: Family Medicine

## 2023-12-17 ENCOUNTER — Ambulatory Visit (HOSPITAL_BASED_OUTPATIENT_CLINIC_OR_DEPARTMENT_OTHER)
Admission: RE | Admit: 2023-12-17 | Discharge: 2023-12-17 | Disposition: A | Discharge status: Home / Self Care | Source: Ambulatory Visit | Attending: Family Medicine | Admitting: Family Medicine

## 2023-12-17 ENCOUNTER — Ambulatory Visit: Payer: Self-pay | Admitting: Family Medicine

## 2023-12-17 DIAGNOSIS — M8589 Other specified disorders of bone density and structure, multiple sites: Secondary | ICD-10-CM | POA: Insufficient documentation

## 2023-12-17 DIAGNOSIS — Z780 Asymptomatic menopausal state: Secondary | ICD-10-CM | POA: Diagnosis not present

## 2023-12-17 DIAGNOSIS — M85859 Other specified disorders of bone density and structure, unspecified thigh: Secondary | ICD-10-CM | POA: Diagnosis present

## 2024-01-07 ENCOUNTER — Other Ambulatory Visit: Payer: Self-pay | Admitting: Family Medicine

## 2024-01-07 DIAGNOSIS — Z1231 Encounter for screening mammogram for malignant neoplasm of breast: Secondary | ICD-10-CM

## 2024-01-08 ENCOUNTER — Ambulatory Visit: Admitting: Family Medicine

## 2024-01-08 VITALS — BP 118/80 | Ht 69.0 in | Wt 140.0 lb

## 2024-01-08 DIAGNOSIS — M5416 Radiculopathy, lumbar region: Secondary | ICD-10-CM | POA: Diagnosis not present

## 2024-01-08 DIAGNOSIS — M25511 Pain in right shoulder: Secondary | ICD-10-CM

## 2024-01-08 MED ORDER — METHYLPREDNISOLONE ACETATE 40 MG/ML IJ SUSP
40.0000 mg | Freq: Once | INTRAMUSCULAR | Status: AC
  Administered 2024-01-08: 40 mg via INTRA_ARTICULAR

## 2024-01-08 NOTE — Patient Instructions (Signed)
 We've placed the referral for a repeat back injection. Whitley from [facility] will call you to schedule. If you don't hear from he by middle of next week, pleases call her at [phone]

## 2024-01-09 NOTE — Progress Notes (Signed)
°  Kimberly Guerrero - 73 y.o. female MRN [id number]  Date of birth: Sep 21, 1950    SUBJECTIVE:      Chief Complaint:/ HPI:   Right shoulder pain worsening in last 3-4 weeks. Has had it before but several years ago. No specific inciting event. Bothers her with any attempt at overhead activity and also at night when Kimberly Guerrero Guerrero. Center of shoulder, aching and sharp at times. 2. Also thinks it Kimberly time to set up another ESI for her low back. Last one was bout 2 years ago and Kimberly Guerrero Guerrero from seated position especially with low back pain and stiffness.  3.Continues on the chronic pain meds *tylenol  #3) which help but in last 2 months these are not holding her pain as well as they were previously.    OBJECTIVE: BP 118/80   Ht 5' 9 (1.753 m)   Wt 140 lb (63.5 kg)   BMI 20.67 kg/m   Physical Exam:  Vital signs are reviewed. GEN every day WN NAD Right shoulder limited ABduction and forward flexion secondary to pain and stiffness. Passively can have her arm elevated to 110-120 degrees in ABduction and forward flexion 140 degrees, Strength of rotator cuff muscles Kimberly intact. No biceps tendon ttp NV intact in right hand with symmetrical grip strength.  BACk mild spasm lumbar area. Stiff movements getting on and off exam table but accomplished independently.   PROCEDURE: INJECTION: Patient was given informed consent, signed copy in the chart. Appropriate time out was taken. Area prepped and draped in usual sterile fashion. Ethyl chloride was  used for local anesthesia. A 21 gauge 1 1/2 inch needle was used.. 1 cc of methylprednisolone  40 mg/ml plus  4 cc of 1% lidocaine  without epinephrine was injected into the right subacromial bursa using a(n) posterior approach.   The patient tolerated the procedure well. There were no complications. Post procedure instructions were given.  ASSESSMENT & PLAN:  Rotator cuff tendinitis:  -HEP given as I am concerned Kimberly may be at  increased risk of adhesive capsulitis - CSI today - f/u if not having significant improvement in 2-3 weeks -discussed ROM and potential for frozen shoulder  2. Chronic low back pain -continue Rx for acetaminophen  with codeine  -set up next [facility] with interventional radiology - Kimberly will let me know if this does not help

## 2024-01-29 ENCOUNTER — Other Ambulatory Visit: Payer: Self-pay | Admitting: Family Medicine

## 2024-01-29 DIAGNOSIS — M5416 Radiculopathy, lumbar region: Secondary | ICD-10-CM

## 2024-02-01 ENCOUNTER — Inpatient Hospital Stay
Admission: RE | Admit: 2024-02-01 | Discharge: 2024-02-01 | Disposition: A | Source: Ambulatory Visit | Attending: Family Medicine | Admitting: Family Medicine

## 2024-02-01 DIAGNOSIS — M5416 Radiculopathy, lumbar region: Secondary | ICD-10-CM

## 2024-02-01 MED ORDER — IOPAMIDOL (ISOVUE-M 200) INJECTION 41%
1.0000 mL | Freq: Once | INTRAMUSCULAR | Status: AC
  Administered 2024-02-01: 1 mL via EPIDURAL

## 2024-02-01 MED ORDER — METHYLPREDNISOLONE ACETATE 40 MG/ML INJ SUSP (RADIOLOG
80.0000 mg | Freq: Once | INTRAMUSCULAR | Status: AC
  Administered 2024-02-01: 80 mg via EPIDURAL

## 2024-02-01 NOTE — Discharge Instructions (Signed)

## 2024-02-03 ENCOUNTER — Ambulatory Visit
Admission: RE | Admit: 2024-02-03 | Discharge: 2024-02-03 | Disposition: A | Discharge status: Home / Self Care | Source: Ambulatory Visit | Attending: Family Medicine | Admitting: Family Medicine

## 2024-02-03 DIAGNOSIS — Z1231 Encounter for screening mammogram for malignant neoplasm of breast: Secondary | ICD-10-CM

## 2024-02-04 ENCOUNTER — Other Ambulatory Visit

## 2024-02-05 ENCOUNTER — Ambulatory Visit: Admitting: Family Medicine

## 2024-02-05 VITALS — BP 134/75 | Ht 69.0 in | Wt 140.0 lb

## 2024-02-05 DIAGNOSIS — M5416 Radiculopathy, lumbar region: Secondary | ICD-10-CM | POA: Diagnosis not present

## 2024-02-05 DIAGNOSIS — M25511 Pain in right shoulder: Secondary | ICD-10-CM

## 2024-02-05 DIAGNOSIS — M48062 Spinal stenosis, lumbar region with neurogenic claudication: Secondary | ICD-10-CM | POA: Diagnosis not present

## 2024-02-05 MED ORDER — MELOXICAM 7.5 MG PO TABS: 7.5000 mg | ORAL_TABLET | Freq: Every day | ORAL | 5 refills | Status: AC

## 2024-02-05 NOTE — Progress Notes (Signed)
" °  Kimberly Guerrero - 74 y.o. female MRN [id number]  Date of birth: 1951-01-13    SUBJECTIVE:      Chief Complaint:/ HPI:  Follow-up shoulder pain: Had injection at last office visit is about 90% better.  Range of motion is back to normal.  Still feels a little weak at certain extensions of the shoulder in forward flexion but otherwise return to normal.  She is quite pleased. 2.  Had her CSI done under fluoroscopy for her back.  Also doing extremely well from that.  Has questions about other options.  She did ask them about recurrent injections and they said they will do up to 6/year. 3.  Does need a refill on meloxicam  if that helps when she is having aching pain in her back.  She still uses the Tylenol  3 when the pain is really bad.    OBJECTIVE: BP 134/75   Ht 5' 9 (1.753 m)   Wt 140 lb (63.5 kg)   BMI 20.67 kg/m   Physical Exam:  Vital signs are reviewed. GENERAL: Well-developed no acute distress SHOULDERS: Full range of motion and strength in all planes the rotator cuff bilaterally.  ASSESSMENT & PLAN: #1.  Rotator cuff syndrome significantly improved after corticosteroid injection.  I was concerned at last office visit she might be at risk for developing adhesive capsulitis so we talked about doing her home exercise program at least twice a week regularly.  In or if she develops pain again she will return to clinic. 2.  Chronic back pain responding well to epidural steroid injections.  We discussed.  No need for further evaluation by intervention such as surgical at this time and she would really like to avoid surgery anyway.  I will see her follow-up for these 2 issues as needed. See problem based charting & AVS for pt instructions. No problem-specific Assessment & Plan notes found for this encounter.  "

## 2024-02-12 ENCOUNTER — Other Ambulatory Visit

## 2024-04-11 ENCOUNTER — Encounter: Admitting: Obstetrics and Gynecology

## 2024-06-09 ENCOUNTER — Encounter
